# Patient Record
Sex: Female | Born: 1955 | Race: White | Hispanic: No | Marital: Married | Smoking: Never smoker
Health system: Southern US, Community
[De-identification: ages and names within clinical notes are randomized; demographics above are authoritative.]

## PROBLEM LIST (undated history)

## (undated) DIAGNOSIS — E119 Type 2 diabetes mellitus without complications: Secondary | ICD-10-CM

## (undated) DIAGNOSIS — E785 Hyperlipidemia, unspecified: Secondary | ICD-10-CM

## (undated) DIAGNOSIS — I10 Essential (primary) hypertension: Secondary | ICD-10-CM

## (undated) DIAGNOSIS — C50919 Malignant neoplasm of unspecified site of unspecified female breast: Secondary | ICD-10-CM

## (undated) DIAGNOSIS — D693 Immune thrombocytopenic purpura: Secondary | ICD-10-CM

## (undated) HISTORY — PX: TONSILLECTOMY: SUR1361

## (undated) HISTORY — DX: Hyperlipidemia, unspecified: E78.5

## (undated) HISTORY — DX: Immune thrombocytopenic purpura: D69.3

## (undated) HISTORY — DX: Malignant neoplasm of unspecified site of unspecified female breast: C50.919

## (undated) HISTORY — DX: Essential (primary) hypertension: I10

---

## 1985-02-14 DIAGNOSIS — D693 Immune thrombocytopenic purpura: Secondary | ICD-10-CM

## 1985-02-14 HISTORY — DX: Immune thrombocytopenic purpura: D69.3

## 1996-03-21 HISTORY — PX: LUMBAR DISC SURGERY: SHX700

## 1997-07-04 ENCOUNTER — Other Ambulatory Visit: Admission: RE | Admit: 1997-07-04 | Discharge: 1997-07-04 | Payer: Self-pay | Admitting: Obstetrics and Gynecology

## 1997-09-16 ENCOUNTER — Ambulatory Visit (HOSPITAL_COMMUNITY): Admission: RE | Admit: 1997-09-16 | Discharge: 1997-09-16 | Payer: Self-pay | Admitting: Obstetrics and Gynecology

## 1997-10-07 ENCOUNTER — Other Ambulatory Visit: Admission: RE | Admit: 1997-10-07 | Discharge: 1997-10-07 | Payer: Self-pay | Admitting: Obstetrics and Gynecology

## 1998-08-20 ENCOUNTER — Other Ambulatory Visit: Admission: RE | Admit: 1998-08-20 | Discharge: 1998-08-20 | Payer: Self-pay | Admitting: Obstetrics and Gynecology

## 1998-08-20 ENCOUNTER — Encounter (INDEPENDENT_AMBULATORY_CARE_PROVIDER_SITE_OTHER): Payer: Self-pay | Admitting: Specialist

## 1998-11-18 ENCOUNTER — Encounter (INDEPENDENT_AMBULATORY_CARE_PROVIDER_SITE_OTHER): Payer: Self-pay

## 1998-11-18 ENCOUNTER — Inpatient Hospital Stay (HOSPITAL_COMMUNITY): Admission: RE | Admit: 1998-11-18 | Discharge: 1998-11-19 | Payer: Self-pay | Admitting: Obstetrics and Gynecology

## 1998-11-18 HISTORY — PX: ABDOMINAL HYSTERECTOMY: SHX81

## 1999-02-17 ENCOUNTER — Other Ambulatory Visit: Admission: RE | Admit: 1999-02-17 | Discharge: 1999-02-17 | Payer: Self-pay | Admitting: Obstetrics and Gynecology

## 1999-03-04 ENCOUNTER — Ambulatory Visit (HOSPITAL_COMMUNITY): Admission: RE | Admit: 1999-03-04 | Discharge: 1999-03-04 | Payer: Self-pay | Admitting: Obstetrics and Gynecology

## 1999-03-04 ENCOUNTER — Encounter: Payer: Self-pay | Admitting: Obstetrics and Gynecology

## 2000-03-21 ENCOUNTER — Encounter: Payer: Self-pay | Admitting: Obstetrics and Gynecology

## 2000-03-21 ENCOUNTER — Ambulatory Visit (HOSPITAL_COMMUNITY): Admission: RE | Admit: 2000-03-21 | Discharge: 2000-03-21 | Payer: Self-pay | Admitting: Obstetrics and Gynecology

## 2001-04-17 ENCOUNTER — Ambulatory Visit (HOSPITAL_COMMUNITY): Admission: RE | Admit: 2001-04-17 | Discharge: 2001-04-17 | Payer: Self-pay | Admitting: Obstetrics and Gynecology

## 2001-04-17 ENCOUNTER — Encounter: Payer: Self-pay | Admitting: Obstetrics and Gynecology

## 2001-06-13 ENCOUNTER — Other Ambulatory Visit: Admission: RE | Admit: 2001-06-13 | Discharge: 2001-06-13 | Payer: Self-pay | Admitting: Obstetrics and Gynecology

## 2002-06-06 ENCOUNTER — Encounter: Payer: Self-pay | Admitting: Obstetrics and Gynecology

## 2002-06-06 ENCOUNTER — Ambulatory Visit (HOSPITAL_COMMUNITY): Admission: RE | Admit: 2002-06-06 | Discharge: 2002-06-06 | Payer: Self-pay | Admitting: Obstetrics and Gynecology

## 2003-02-17 ENCOUNTER — Ambulatory Visit (HOSPITAL_BASED_OUTPATIENT_CLINIC_OR_DEPARTMENT_OTHER): Admission: RE | Admit: 2003-02-17 | Discharge: 2003-02-17 | Payer: Self-pay | Admitting: Family Medicine

## 2003-07-29 ENCOUNTER — Ambulatory Visit (HOSPITAL_COMMUNITY): Admission: RE | Admit: 2003-07-29 | Discharge: 2003-07-29 | Payer: Self-pay | Admitting: Family Medicine

## 2004-02-13 ENCOUNTER — Ambulatory Visit (HOSPITAL_COMMUNITY): Admission: RE | Admit: 2004-02-13 | Discharge: 2004-02-13 | Payer: Self-pay | Admitting: Gastroenterology

## 2004-08-09 ENCOUNTER — Ambulatory Visit (HOSPITAL_COMMUNITY): Admission: RE | Admit: 2004-08-09 | Discharge: 2004-08-09 | Payer: Self-pay | Admitting: Family Medicine

## 2004-08-16 ENCOUNTER — Encounter: Admission: RE | Admit: 2004-08-16 | Discharge: 2004-08-16 | Payer: Self-pay | Admitting: Family Medicine

## 2005-02-21 ENCOUNTER — Encounter: Admission: RE | Admit: 2005-02-21 | Discharge: 2005-02-21 | Payer: Self-pay | Admitting: Family Medicine

## 2005-08-12 ENCOUNTER — Encounter: Admission: RE | Admit: 2005-08-12 | Discharge: 2005-08-12 | Payer: Self-pay | Admitting: Family Medicine

## 2005-08-23 ENCOUNTER — Encounter: Admission: RE | Admit: 2005-08-23 | Discharge: 2005-08-23 | Payer: Self-pay | Admitting: Family Medicine

## 2006-02-24 ENCOUNTER — Encounter: Admission: RE | Admit: 2006-02-24 | Discharge: 2006-02-24 | Payer: Self-pay | Admitting: Family Medicine

## 2006-08-23 ENCOUNTER — Encounter: Admission: RE | Admit: 2006-08-23 | Discharge: 2006-08-23 | Payer: Self-pay | Admitting: Family Medicine

## 2007-09-11 ENCOUNTER — Encounter: Admission: RE | Admit: 2007-09-11 | Discharge: 2007-09-11 | Payer: Self-pay | Admitting: Family Medicine

## 2008-02-04 ENCOUNTER — Encounter (INDEPENDENT_AMBULATORY_CARE_PROVIDER_SITE_OTHER): Payer: Self-pay | Admitting: General Surgery

## 2008-02-04 ENCOUNTER — Ambulatory Visit (HOSPITAL_BASED_OUTPATIENT_CLINIC_OR_DEPARTMENT_OTHER): Admission: RE | Admit: 2008-02-04 | Discharge: 2008-02-04 | Payer: Self-pay | Admitting: General Surgery

## 2008-02-11 ENCOUNTER — Ambulatory Visit (HOSPITAL_COMMUNITY): Admission: RE | Admit: 2008-02-11 | Discharge: 2008-02-11 | Payer: Self-pay | Admitting: Surgery

## 2008-02-12 ENCOUNTER — Ambulatory Visit (HOSPITAL_COMMUNITY): Admission: RE | Admit: 2008-02-12 | Discharge: 2008-02-12 | Payer: Self-pay | Admitting: Surgery

## 2008-03-10 ENCOUNTER — Encounter: Admission: RE | Admit: 2008-03-10 | Discharge: 2008-06-08 | Payer: Self-pay | Admitting: Surgery

## 2008-05-19 ENCOUNTER — Ambulatory Visit (HOSPITAL_COMMUNITY): Admission: RE | Admit: 2008-05-19 | Discharge: 2008-05-20 | Payer: Self-pay | Admitting: Surgery

## 2008-05-19 ENCOUNTER — Encounter (INDEPENDENT_AMBULATORY_CARE_PROVIDER_SITE_OTHER): Payer: Self-pay | Admitting: Surgery

## 2008-05-19 HISTORY — PX: LAPAROSCOPIC GASTRIC BANDING: SHX1100

## 2008-07-15 ENCOUNTER — Encounter: Admission: RE | Admit: 2008-07-15 | Discharge: 2008-10-13 | Payer: Self-pay | Admitting: Surgery

## 2008-09-01 ENCOUNTER — Encounter: Admission: RE | Admit: 2008-09-01 | Discharge: 2008-09-01 | Payer: Self-pay | Admitting: Family Medicine

## 2008-09-11 ENCOUNTER — Encounter: Admission: RE | Admit: 2008-09-11 | Discharge: 2008-09-11 | Payer: Self-pay | Admitting: Family Medicine

## 2008-10-03 ENCOUNTER — Encounter: Admission: RE | Admit: 2008-10-03 | Discharge: 2008-10-03 | Payer: Self-pay | Admitting: Urology

## 2008-10-28 ENCOUNTER — Encounter: Admission: RE | Admit: 2008-10-28 | Discharge: 2008-10-28 | Payer: Self-pay | Admitting: Surgery

## 2009-02-27 ENCOUNTER — Encounter: Admission: RE | Admit: 2009-02-27 | Discharge: 2009-05-28 | Payer: Self-pay | Admitting: Surgery

## 2009-10-20 ENCOUNTER — Encounter: Admission: RE | Admit: 2009-10-20 | Discharge: 2009-10-20 | Payer: Self-pay | Admitting: Family Medicine

## 2010-03-07 ENCOUNTER — Encounter: Payer: Self-pay | Admitting: Family Medicine

## 2010-05-26 LAB — DIFFERENTIAL
Eosinophils Absolute: 0 10*3/uL (ref 0.0–0.7)
Lymphs Abs: 1.1 10*3/uL (ref 0.7–4.0)
Monocytes Relative: 8 % (ref 3–12)
Neutro Abs: 4 10*3/uL (ref 1.7–7.7)
Neutrophils Relative %: 72 % (ref 43–77)

## 2010-05-26 LAB — CBC
Hemoglobin: 12.6 g/dL (ref 12.0–15.0)
MCV: 91.5 fL (ref 78.0–100.0)
RBC: 4.03 MIL/uL (ref 3.87–5.11)
WBC: 5.5 10*3/uL (ref 4.0–10.5)

## 2010-05-26 LAB — HEMOGLOBIN AND HEMATOCRIT, BLOOD: HCT: 38.2 % (ref 36.0–46.0)

## 2010-05-27 LAB — COMPREHENSIVE METABOLIC PANEL
CO2: 28 mEq/L (ref 19–32)
Calcium: 9.6 mg/dL (ref 8.4–10.5)
Creatinine, Ser: 0.84 mg/dL (ref 0.4–1.2)
GFR calc non Af Amer: >60 mL/min (ref >60)
Glucose, Bld: 133 mg/dL — ABNORMAL HIGH (ref 70–99)

## 2010-05-27 LAB — DIFFERENTIAL
Lymphocytes Relative: 23 % (ref 12–46)
Lymphs Abs: 1.6 10*3/uL (ref 0.7–4.0)
Neutrophils Relative %: 66 % (ref 43–77)

## 2010-05-27 LAB — CBC
Hemoglobin: 13.8 g/dL (ref 12.0–15.0)
MCHC: 33.7 g/dL (ref 30.0–36.0)
MCV: 91.4 fL (ref 78.0–100.0)
RBC: 4.49 MIL/uL (ref 3.87–5.11)

## 2010-06-29 NOTE — Op Note (Signed)
Lauren [name], Lauren [name]              ACCOUNT NO.:  [id number]   MEDICAL RECORD NO.:  [id number]          PATIENT TYPE:  AMB   LOCATION:  DAY                          FACILITY:  [facility]   PHYSICIAN:  Thornton Park. Daphine Deutscher, MD  DATE OF BIRTH:  1955/11/21   DATE OF PROCEDURE:  05/19/2008  DATE OF DISCHARGE:                               OPERATIVE REPORT   PREOPERATIVE DIAGNOSES:  Morbid obesity, body mass index 48,  questionable small hiatal hernia asymptomatic, gallstones.   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   PROCEDURE:  Laparoscopic excision of gastrointestinal stromal tumor from  stomach, laparoscopic APS Allergan Lap-Band.   SURGEON:  Luretha Murphy, M.D.   ASSISTANT:  Ovidio Kin, M.D.   ANESTHESIA:  General endotracheal.   DESCRIPTION OF PROCEDURE:  Lauren [name] was taken to room one on  Monday, May 19, 2008 and given general anesthesia.  The abdomen was  prepped with a Techni-Care equivalent and draped sterilely.  The access  of the abdomen was gained through the left upper quadrant with an  Optiview 0 degree scope entering without difficulty.  The abdomen was  insufflated.  She had a very prominent protuberance anteriorly as if she  has kind of stretched her anterior abdominal wall.  Standard trocar  placement was used including a 15 in the right upper quadrant placed  obliquely, an 11 below that on the right where we subsequently put her  subcutaneous port, 5 in the middle where the [facility] retractor was  used retract the liver.  First I surveyed her gallbladder and was nice  Lodema's egg blue with no evidence of any adhesions or evidence of a  cholecystitis.  We went up anteriorly and exposed the stomach, found a  little white, firm nodule along the fundus probably a GIST.  I went  ahead and put a suture through that  and held it with tie knot and held  it up while I got under it with an Endo-GIA and stapled under it to get  a good margin.  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  Meanwhile, I went posteriorly and after we  passed the balloon initially and filled it up with 15 mL air and pulled  it back at it stopped at the hiatus.  There was no dimpling.  I went  ahead and exposed the hiatus posteriorly, looked at it, blew up the  balloon again with 10 mL and again it held up at the hiatus, so I think  that it is unlikely she had a significant hiatal hernia.   The APS band was then inserted after passing from the fat stripe along  the right crus using the pars flaccida technique, coming around the top  part of stomach and then bringing it around, engaging it and then  passing and snapping it over the calibration tubing.  Once it was  engaged it was held down while I plicated with three sutures using tie  knots.  This went well.   The tubing was brought out through the right lower port, connected to  the port which had some mesh sewn on the back and it was implanted in  the subcutaneous position.  The wounds were irrigated and infiltrated  with Marcaine and were closed with 4-0 Vicryl, Benzoin and Steri-Strips.  The patient tolerated the procedure well and was taken to the recovery  room in satisfactory condition.      Thornton Park Daphine Deutscher, MD  Electronically Signed     MBM/MEDQ  D:  05/19/2008  T:  05/19/2008  Job:  [id number]   cc:   Otilio Connors. Gerri Spore, M.D.  Fax: [phone]

## 2010-06-29 NOTE — Op Note (Signed)
[name], [name]              ACCOUNT NO.:  [id number]   MEDICAL RECORD NO.:  [id number]          PATIENT TYPE:  AMB   LOCATION:  DSC                          FACILITY:  [facility]   PHYSICIAN:  Gabrielle Dare. Janee Morn, M.D.DATE OF BIRTH:  1955-11-25   DATE OF PROCEDURE:  DATE OF DISCHARGE:                               OPERATIVE REPORT   PREOPERATIVE DIAGNOSIS:  Sebaceous cyst of the scalp x2.   POSTOPERATIVE DIAGNOSIS:  Sebaceous cyst of the scalp x2.   PROCEDURE:  Excision of sebaceous cysts of the scalp x2.   SURGEON:  Gabrielle Dare. Janee Morn, MD   ANESTHESIA:  General, laryngeal mask airway   HISTORY OF PRESENT ILLNESS:  [name] is a 55 year old female who  has a many-year history of enlarging cysts on her scalp, one on the  right side and one on the left.  They are in the mid parietal region.  She presents today for elective excision of both.  She had no evidence  of infectious complications.   PROCEDURE IN DETAIL:  The patient was identified in the preop holding  area and informed consent was obtained.  She received intravenous  antibiotics.  Her 2 cyst sites were marked, and again there is no  evidence of acute infection at this time.  She was brought to the  operating room.  General anesthesia with laryngeal mask airway was  administered by the anesthesia staff, and her scalp was prepped and  draped in sterile fashion after limited hair coming over both scalps.  Attention was first directed to the cyst on the right scalp.  Area was  infiltrated with 0.25% Marcaine with epinephrine.  A transverse incision  was made revealing the cyst wall.  This was circumferentially dissected  and removed on one piece and sent to Pathology.  Wound was copiously  irrigated.  Hemostasis was obtained using Bovie cautery.  Wound was then  closed with interrupted 3-0 nylon sutures which also achieved excellent  hemostasis.  The attention was directed to the left cyst.  This area was  again  injected with 0.25% Marcaine with epinephrine.  Transverse  incision was made.  Subcutaneous tissue was dissected down revealing the  cyst wall.  This was circumferentially dissected with sharp dissection,  and the cyst wall was removed in its entirety without difficulty.  Wound  was copiously irrigated.  Hemostasis was obtained with Bovie cautery.  The wound was then closed with interrupted 3-0 nylon sutures and  excellent hemostasis was achieved.  Both cysts were sent separately to  Pathology.  Sponge, needle, and instrument counts were all correct for  both.  Some triple antibiotic ointment was placed.  The patient  tolerated the procedure well without apparent complication and was taken  to recovery room in stable condition.     Gabrielle Dare Janee Morn, M.D.  Electronically Signed    BET/MEDQ  D:  02/04/2008  T:  02/05/2008  Job:  [id number]   cc:   Otilio Connors. Gerri Spore, M.D.

## 2010-07-02 NOTE — Op Note (Signed)
NAMEJANESA, Lauren [name]              ACCOUNT NO.:  [id number]   MEDICAL RECORD NO.:  [id number]          PATIENT TYPE:  AMB   LOCATION:  ENDO                         FACILITY:  [facility]   PHYSICIAN:  John C. Madilyn Fireman, M.D.    DATE OF BIRTH:  [birth year]   DATE OF PROCEDURE:  02/13/2004  DATE OF DISCHARGE:                                 OPERATIVE REPORT   PROCEDURE:  Colonoscopy.   INDICATION FOR PROCEDURE:  Average risk colon cancer screening.   DESCRIPTION OF PROCEDURE:  The patient was placed in the left lateral  decubitus position and placed on the pulse monitor with continuous low-flow  oxygen delivered by nasal cannula.  She was sedated with 100 mcg IV fentanyl  and 10 mg IV Versed.  The Olympus video colonoscope was inserted into the  rectum and advanced to the cecum, confirmed by transillumination at  McBurney's point and visualization of the ileocecal valve and appendiceal  orifice.  The prep was excellent.  The cecum, ascending, transverse,  descending, and sigmoid colon all appeared normal with no masses, polyps,  diverticula, or other mucosal abnormalities.  The rectum likewise appeared  normal, and retroflexed view of the anus revealed no obvious internal  hemorrhoids.  The scope was then withdrawn and the patient returned to the  recovery room in stable condition.  She tolerated the procedure well, and  there were no immediate complications.   IMPRESSION:  Normal colonoscopy.   PLAN:  Next colon screening by sigmoidoscopy in 5 years.      JCH/MEDQ  D:  02/13/2004  T:  02/13/2004  Job:  [id number]   cc:   Duncan Dull, M.D.  [address]  [place]  [place] [address]  Fax: [phone]

## 2010-09-21 ENCOUNTER — Other Ambulatory Visit: Payer: Self-pay | Admitting: Family Medicine

## 2010-09-21 DIAGNOSIS — Z1231 Encounter for screening mammogram for malignant neoplasm of breast: Secondary | ICD-10-CM

## 2010-09-24 ENCOUNTER — Encounter (INDEPENDENT_AMBULATORY_CARE_PROVIDER_SITE_OTHER): Payer: Self-pay

## 2010-10-21 ENCOUNTER — Other Ambulatory Visit: Payer: Self-pay | Admitting: Family Medicine

## 2010-10-21 DIAGNOSIS — R1011 Right upper quadrant pain: Secondary | ICD-10-CM

## 2010-10-22 ENCOUNTER — Ambulatory Visit
Admission: RE | Admit: 2010-10-22 | Discharge: 2010-10-22 | Disposition: A | Discharge status: Home / Self Care | Payer: BC Managed Care – PPO | Source: Ambulatory Visit | Attending: Family Medicine | Admitting: Family Medicine

## 2010-10-22 DIAGNOSIS — R1011 Right upper quadrant pain: Secondary | ICD-10-CM

## 2010-10-25 ENCOUNTER — Ambulatory Visit
Admission: RE | Admit: 2010-10-25 | Discharge: 2010-10-25 | Disposition: A | Discharge status: Home / Self Care | Payer: BC Managed Care – PPO | Source: Ambulatory Visit | Attending: Family Medicine | Admitting: Family Medicine

## 2010-10-25 DIAGNOSIS — Z1231 Encounter for screening mammogram for malignant neoplasm of breast: Secondary | ICD-10-CM

## 2010-10-27 ENCOUNTER — Ambulatory Visit: Payer: Self-pay

## 2010-12-06 ENCOUNTER — Telehealth (INDEPENDENT_AMBULATORY_CARE_PROVIDER_SITE_OTHER): Payer: Self-pay

## 2010-12-06 NOTE — Telephone Encounter (Signed)
Pt calling wanting to know about taking OTC Robitussiem for a severe cough she has had  for several weeks. I advised her it was ok as long as there was no Aspirin in the product. Pt understands.Lauren [name]

## 2011-03-03 ENCOUNTER — Encounter (INDEPENDENT_AMBULATORY_CARE_PROVIDER_SITE_OTHER): Payer: Self-pay | Admitting: General Surgery

## 2011-03-03 ENCOUNTER — Ambulatory Visit (INDEPENDENT_AMBULATORY_CARE_PROVIDER_SITE_OTHER): Payer: BC Managed Care – PPO | Admitting: Surgery

## 2011-03-03 ENCOUNTER — Encounter (INDEPENDENT_AMBULATORY_CARE_PROVIDER_SITE_OTHER): Payer: Self-pay | Admitting: Surgery

## 2011-03-03 VITALS — BP 112/80 | HR 76 | Resp 16 | Ht 66.5 in | Wt 228.5 lb

## 2011-03-03 DIAGNOSIS — Z9884 Bariatric surgery status: Secondary | ICD-10-CM

## 2011-03-03 NOTE — Progress Notes (Signed)
Lauren [name] comes in today after her lab Band-Aid PDS. She was last seen by me in the in March of 12. Her band was put in April of 10. She has an APS band Her main complaint now is when she eats anything she is having increasing problems with pain in the xiphoid region sometimes a burning and sometimes with a lump in her. It sounds like she is having some element of esophageal spasm. I think since she's had this for a while however like to get an upper GI series to see if she has a low anterior slip or if she develops some dilatation of the esophagus. We'll get an upper GI series and I'll see her back after that.

## 2011-03-10 ENCOUNTER — Ambulatory Visit
Admission: RE | Admit: 2011-03-10 | Discharge: 2011-03-10 | Disposition: A | Discharge status: Home / Self Care | Payer: BC Managed Care – PPO | Source: Ambulatory Visit | Attending: Surgery | Admitting: Surgery

## 2011-03-10 DIAGNOSIS — Z9884 Bariatric surgery status: Secondary | ICD-10-CM

## 2011-03-16 ENCOUNTER — Telehealth (INDEPENDENT_AMBULATORY_CARE_PROVIDER_SITE_OTHER): Payer: Self-pay | Admitting: General Surgery

## 2011-03-16 NOTE — Telephone Encounter (Signed)
Spoke with the patient to discuss her UGI results. She is requesting fluid being removed but unable to come to the office this week she will call to RS.

## 2011-03-18 ENCOUNTER — Ambulatory Visit (INDEPENDENT_AMBULATORY_CARE_PROVIDER_SITE_OTHER): Payer: BC Managed Care – PPO | Admitting: Surgery

## 2011-03-18 DIAGNOSIS — D214 Benign neoplasm of connective and other soft tissue of abdomen: Secondary | ICD-10-CM | POA: Insufficient documentation

## 2011-03-18 DIAGNOSIS — Z9884 Bariatric surgery status: Secondary | ICD-10-CM

## 2011-03-18 DIAGNOSIS — R112 Nausea with vomiting, unspecified: Secondary | ICD-10-CM

## 2011-03-18 NOTE — Progress Notes (Signed)
UGI noted.  Will get thyroid ultrasound.  Took out 0.3 cc from band and will see if that relieves her nausea and vomiting.  If not then we need to perform lap chole for gallstones that may be causing her nausea.

## 2011-03-18 NOTE — Progress Notes (Signed)
Addended by: Latricia Heft on: 03/18/2011 03:08 PM   Modules accepted: Orders

## 2011-03-22 ENCOUNTER — Other Ambulatory Visit: Payer: BC Managed Care – PPO

## 2011-03-25 ENCOUNTER — Ambulatory Visit
Admission: RE | Admit: 2011-03-25 | Discharge: 2011-03-25 | Disposition: A | Discharge status: Home / Self Care | Payer: BC Managed Care – PPO | Source: Ambulatory Visit | Attending: Surgery | Admitting: Surgery

## 2011-03-25 DIAGNOSIS — Z9884 Bariatric surgery status: Secondary | ICD-10-CM

## 2011-04-29 ENCOUNTER — Encounter (INDEPENDENT_AMBULATORY_CARE_PROVIDER_SITE_OTHER): Payer: Self-pay | Admitting: Surgery

## 2011-04-29 ENCOUNTER — Ambulatory Visit (INDEPENDENT_AMBULATORY_CARE_PROVIDER_SITE_OTHER): Payer: BC Managed Care – PPO | Admitting: Surgery

## 2011-04-29 VITALS — BP 134/88 | HR 68 | Temp 97.8°F | Resp 18 | Ht 66.5 in | Wt 236.6 lb

## 2011-04-29 DIAGNOSIS — E049 Nontoxic goiter, unspecified: Secondary | ICD-10-CM

## 2011-04-29 DIAGNOSIS — K811 Chronic cholecystitis: Secondary | ICD-10-CM

## 2011-04-29 NOTE — Progress Notes (Signed)
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 female Is seen in followup after her lapband ATS with removal of a gist tumor in April of 2010. She is down about 54 pounds and his [place] though with some nausea and vomiting. She has known gallstones. We discussed removal of her gallbladder as this may be chronic cholecystitis. She also had the gist tumor that were removed the time of her lapband and that will weighs undermine and she was noted we can look at that as well. I offered her fell but she is back taken at that may be contributing to her nausea and vomiting stitch to tie.  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 She thinks it would best be done in June. We can quit in schedule her elective laparoscopic cholecystectomy with the examination of her stomach GIST resection site.  She also has a multinodular goiter. This is not that obvious by palpation you can appreciated. Ultrasound was performed which did not show any definite lesions there were amenable to biopsy. We discussed management of this and we'll watch this. Currently she is asymptomatic.  We'll plan laparoscopic cholecystectomy in June  Past Medical History  Diagnosis Date  . Hyperlipidemia   . Hypertension   . Idiopathic thrombocytopenic purpura (ITP)     Past Surgical History  Procedure Date  . Laparoscopic gastric banding 05/19/2008  . Partial hysterectomy 11/18/1998    ovaries remain  . Lumbar disc surgery 03/21/1996    Current Outpatient Prescriptions  Medication Sig Dispense Refill  . AMBULATORY NON FORMULARY MEDICATION Stool softener      . aspirin 81 MG tablet Take 160 mg by mouth daily.      Marland Kitchen atenolol (TENORMIN) 50 MG tablet Take 50 mg by mouth 2 (two) times daily.      . benazepril-hydrochlorthiazide (LOTENSIN HCT) 20-12.5 MG per tablet daily.      . Calcium  Carbonate-Vit D-Min (GNP CALCIUM PLUS 600 +D PO) Take by mouth.      . multivitamin-iron-minerals-folic acid (CENTRUM) chewable tablet Chew 1 tablet by mouth daily.      . simvastatin (ZOCOR) 20 MG tablet Take 20 mg by mouth every evening.       Review of patient's allergies indicates no known allergies. Family History  Problem Relation Age of Onset  . Breast cancer Maternal Aunt   . Cancer Maternal Aunt     breast  . Colon polyps Mother   . Heart disease Mother   . Diabetes Father   . Heart disease Father    Social History:   reports that she has never smoked. She does not have any smokeless tobacco history on file. She reports that she does not drink alcohol or use illicit drugs.   REVIEW OF SYSTEMS - PERTINENT POSITIVES ONLY: noncontributory  Physical Exam:   Blood pressure 134/88, pulse 68, temperature 97.8 F (36.6 C), temperature source Temporal, resp. rate 18, height 5' 6.5" (1.689 m), weight 236 lb 9.6 oz (107.321 kg). Body mass index is 37.62 kg/(m^2).  Gen:  WDWN white female NAD  Neurological: Alert and oriented to person, place, and time. Motor and sensory function is grossly intact  Head: Normocephalic and atraumatic.  Eyes: Conjunctivae are normal. Pupils are equal, round, and reactive to light. No scleral icterus.  Neck: Normal range of motion. Neck supple.  No tracheal deviation or thyromegaly present.  Cardiovascular:  SR without murmurs or gallops.  No carotid bruits Respiratory: Effort normal.  No respiratory distress. No chest wall tenderness. Breath sounds normal.  No wheezes, rales or rhonchi.  Abdomen:  Not examined GU: Musculoskeletal: Normal range of motion. Extremities are nontender. No cyanosis, edema or clubbing noted Lymphadenopathy: No cervical, preauricular, postauricular or axillary adenopathy is present Skin: Skin is warm and dry. No rash noted. No diaphoresis. No erythema. No pallor. Pscyh: Normal mood and affect. Behavior is normal. Judgment and  thought content normal.   LABORATORY RESULTS: No results found for this or any previous visit (from the past 48 hour(s)).  RADIOLOGY RESULTS: No results found.  Problem List: Patient Active Problem List  Diagnoses  . Lapband APS April 2010  . 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  Lapband APS and multinodular goiter Lapchole in June    Matt B. Daphine Deutscher, MD, [facility] [phone] beeper [phone]  04/29/2011 5:30 PM

## 2011-05-16 ENCOUNTER — Telehealth (INDEPENDENT_AMBULATORY_CARE_PROVIDER_SITE_OTHER): Payer: Self-pay | Admitting: General Surgery

## 2011-05-16 NOTE — Progress Notes (Signed)
Quick Note:  Please make patient an appointment to see me. Thanks ______ 

## 2011-05-16 NOTE — Telephone Encounter (Addendum)
Message copied by Latricia Heft on Mon May 16, 2011  3:10 PM ------      Message from: Luretha Murphy B      Created: Mon May 16, 2011 12:04 PM       Please make  patient an appointment to see me. Thanks  Contacted the patient and scheduled an appt for 05/21/11 per Dr Norva Riffle instruction

## 2011-05-18 ENCOUNTER — Encounter (INDEPENDENT_AMBULATORY_CARE_PROVIDER_SITE_OTHER): Payer: BC Managed Care – PPO | Admitting: Surgery

## 2011-07-15 ENCOUNTER — Encounter (INDEPENDENT_AMBULATORY_CARE_PROVIDER_SITE_OTHER): Payer: Self-pay | Admitting: Surgery

## 2011-07-15 ENCOUNTER — Ambulatory Visit (INDEPENDENT_AMBULATORY_CARE_PROVIDER_SITE_OTHER): Payer: BC Managed Care – PPO | Admitting: Surgery

## 2011-07-15 VITALS — BP 130/76 | HR 70 | Temp 97.4°F | Resp 16 | Ht 66.5 in | Wt 243.1 lb

## 2011-07-15 DIAGNOSIS — K811 Chronic cholecystitis: Secondary | ICD-10-CM

## 2011-07-15 NOTE — Progress Notes (Signed)
Chief Complaint:  Bloating abdominal pain and chronic cholecystitis superimposed on lapband  History of Present Illness:  Lauren [name] is an 56 y.o. female who I saw in today to discuss the status of her lapband. She was questioning whether she should have the fill prior to her upcoming surgery taking out her gallbladder. She is getting a little weight since I removed some fluid earlier but still gets bloating abdominal pain which leads me to think that a secondary issue is her gallbladder and that hopefully removal of that will remove the symptoms. I told her I thought it would be better to go in and do her cholecystectomy and then consider more aggressive fills of her band.  She is developing maladaptive eating as she is eating ice cream and I think between that and the [employer] and cottage cheese are thwarting her weight loss.    Past Medical History  Diagnosis Date  . Hyperlipidemia   . Hypertension   . Idiopathic thrombocytopenic purpura (ITP)     Past Surgical History  Procedure Date  . Laparoscopic gastric banding 05/19/2008  . Partial hysterectomy 11/18/1998    ovaries remain  . Lumbar disc surgery 03/21/1996    Current Outpatient Prescriptions  Medication Sig Dispense Refill  . AMBULATORY NON FORMULARY MEDICATION Stool softener      . aspirin 81 MG tablet Take 160 mg by mouth daily.      Marland Kitchen atenolol (TENORMIN) 50 MG tablet Take 50 mg by mouth 2 (two) times daily.      . benazepril-hydrochlorthiazide (LOTENSIN HCT) 20-12.5 MG per tablet daily.      . Calcium Carbonate-Vit D-Min (GNP CALCIUM PLUS 600 +D PO) Take by mouth.      . multivitamin-iron-minerals-folic acid (CENTRUM) chewable tablet Chew 1 tablet by mouth daily.      . simvastatin (ZOCOR) 20 MG tablet Take 20 mg by mouth every evening.       Review of patient's allergies indicates no known allergies. Family History  Problem Relation Age of Onset  . Breast cancer Maternal Aunt   . Cancer Maternal Aunt     breast    . Colon polyps Mother   . Heart disease Mother   . Diabetes Father   . Heart disease Father    Social History:   reports that she has never smoked. She does not have any smokeless tobacco history on file. She reports that she does not drink alcohol or use illicit drugs.   REVIEW OF SYSTEMS - PERTINENT POSITIVES ONLY: noncontributory  Physical Exam:   Blood pressure 130/76, pulse 70, temperature 97.4 F (36.3 C), temperature source Temporal, resp. rate 16, height 5' 6.5" (1.689 m), weight 243 lb 2 oz (110.281 kg). Body mass index is 38.65 kg/(m^2).  Gen:  WDWN white female NAD  Neurological: Alert and oriented to person, place, and time. Motor and sensory function is grossly intact  Head: Normocephalic and atraumatic.  Eyes: Conjunctivae are normal. Pupils are equal, round, and reactive to light. No scleral icterus.  Neck: Normal range of motion. Neck supple. No tracheal deviation or thyromegaly present.  Cardiovascular:  SR without murmurs or gallops.  No carotid bruits Respiratory: Effort normal.  No respiratory distress. No chest wall tenderness. Breath sounds normal.  No wheezes, rales or rhonchi.  Abdomen:  nontender at present GU: Musculoskeletal: Normal range of motion. Extremities are nontender. No cyanosis, edema or clubbing noted Lymphadenopathy: No cervical, preauricular, postauricular or axillary adenopathy is present Skin: Skin is warm  and dry. No rash noted. No diaphoresis. No erythema. No pallor. Pscyh: Normal mood and affect. Behavior is normal. Judgment and thought content normal.   LABORATORY RESULTS: No results found for this or any previous visit (from the past 48 hour(s)).  RADIOLOGY RESULTS: No results found.  Problem List: Patient Active Problem List  Diagnoses  . Lapband APS April 2010  . 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 Multinodular goiter  . Chronic cholecystitis    Assessment &  Plan: Abdominal pain, nausea and chronic cholecystitis is lapband patient.  Plan lap chole June 18th.      Matt B. Daphine Deutscher, MD, [facility] [phone] beeper [phone]  07/15/2011 9:26 AM

## 2011-07-26 ENCOUNTER — Encounter (HOSPITAL_COMMUNITY): Payer: Self-pay | Admitting: Pharmacy Technician

## 2011-07-28 ENCOUNTER — Other Ambulatory Visit (INDEPENDENT_AMBULATORY_CARE_PROVIDER_SITE_OTHER): Payer: Self-pay | Admitting: Surgery

## 2011-07-29 ENCOUNTER — Encounter (HOSPITAL_COMMUNITY)
Admission: RE | Admit: 2011-07-29 | Discharge: 2011-07-29 | Disposition: A | Discharge status: Home / Self Care | Payer: BC Managed Care – PPO | Source: Ambulatory Visit | Attending: Surgery | Admitting: Surgery

## 2011-07-29 ENCOUNTER — Ambulatory Visit (HOSPITAL_COMMUNITY)
Admission: RE | Admit: 2011-07-29 | Discharge: 2011-07-29 | Disposition: A | Discharge status: Home / Self Care | Payer: BC Managed Care – PPO | Source: Ambulatory Visit | Attending: Surgery | Admitting: Surgery

## 2011-07-29 ENCOUNTER — Encounter (HOSPITAL_COMMUNITY): Payer: Self-pay

## 2011-07-29 DIAGNOSIS — Z01812 Encounter for preprocedural laboratory examination: Secondary | ICD-10-CM | POA: Insufficient documentation

## 2011-07-29 DIAGNOSIS — Z01810 Encounter for preprocedural cardiovascular examination: Secondary | ICD-10-CM | POA: Insufficient documentation

## 2011-07-29 DIAGNOSIS — K811 Chronic cholecystitis: Secondary | ICD-10-CM | POA: Insufficient documentation

## 2011-07-29 DIAGNOSIS — Z01818 Encounter for other preprocedural examination: Secondary | ICD-10-CM | POA: Insufficient documentation

## 2011-07-29 DIAGNOSIS — I498 Other specified cardiac arrhythmias: Secondary | ICD-10-CM | POA: Insufficient documentation

## 2011-07-29 LAB — CBC
Hemoglobin: 13.5 g/dL (ref 12.0–15.0)
MCHC: 33.9 g/dL (ref 30.0–36.0)
RDW: 12.6 % (ref 11.5–15.5)
WBC: 5.2 10*3/uL (ref 4.0–10.5)

## 2011-07-29 LAB — BASIC METABOLIC PANEL
Chloride: 103 mEq/L (ref 96–112)
GFR calc Af Amer: >90 mL/min (ref >90)
GFR calc non Af Amer: >90 mL/min (ref >90)
Glucose, Bld: 99 mg/dL (ref 70–99)
Potassium: 3.7 mEq/L (ref 3.5–5.1)
Sodium: 140 mEq/L (ref 135–145)

## 2011-07-29 NOTE — Patient Instructions (Addendum)
20 Lauren [name]  07/29/2011   Your procedure is scheduled on:  08-02-2011  Report to [facility] at  0530 AM.  Call this number if you have problems the morning of surgery: [phone]   Remember:fleets enema night before surgery, stop aspirin and mutivitamin now   Do not eat food or drink liquids:After Midnight.  . stenolol Take these medicines the morning of surgery with A SIP OF WATER: atenolol   Do not wear jewelry or make up.  Do not wear lotions, powders, or perfumes.Do not wear deodorant.    Do not bring valuables to the hospital.  Contacts, dentures or bridgework may not be worn into surgery.  Leave suitcase in the car. After surgery it may be brought to your room.  For patients admitted to the hospital, checkout time is 11:00 AM the day of   discharge.     Special Instructions: CHG Shower Use Special Wash: 1/2 bottle night before surgery and 1/2 bottle morning of surgery, use regular soap on face and front and back private area. No shaving after Saturday 07-30-2011   Please read over the following fact sheets that you were given: MRSA Information  Lauren [name] [facility] pre op nurse phone number [phone], call if needed

## 2011-07-29 NOTE — Pre-Procedure Instructions (Signed)
Does anything for examining lapband need to be on consent for surgery? if  So, please enter new consent order in epic

## 2011-07-30 LAB — SURGICAL PCR SCREEN
MRSA, PCR: INVALID — AB
Staphylococcus aureus: INVALID — AB

## 2011-07-31 LAB — MRSA CULTURE

## 2011-08-01 NOTE — Pre-Procedure Instructions (Signed)
Spoke with dr Council Mechanic aware of ekg results 07-29-2011 pt ok for surgery

## 2011-08-01 NOTE — H&P (Addendum)
Chief Complaint: Bloating abdominal pain and chronic cholecystitis superimposed on lapband  History of Present Illness: Lauren [name] is an 56 y.o. female who I saw in today to discuss the status of her lapband. She was questioning whether she should have the fill prior to her upcoming surgery taking out her gallbladder. She is getting a little weight since I removed some fluid earlier but still gets bloating abdominal pain which leads me to think that a secondary issue is her gallbladder and that hopefully removal of that will remove the symptoms. I told her I thought it would be better to go in and do her cholecystectomy and then consider more aggressive fills of her band. She is developing maladaptive eating as she is eating ice cream and I think between that and the [employer] and cottage cheese are thwarting her weight loss.  Past Medical History   Diagnosis  Date   .  Hyperlipidemia    .  Hypertension    .  Idiopathic thrombocytopenic purpura (ITP)     Past Surgical History   Procedure  Date   .  Laparoscopic gastric banding  05/19/2008   .  Partial hysterectomy  11/18/1998     ovaries remain   .  Lumbar disc surgery  03/21/1996    Current Outpatient Prescriptions   Medication  Sig  Dispense  Refill   .  AMBULATORY NON FORMULARY MEDICATION  Stool softener     .  aspirin 81 MG tablet  Take 160 mg by mouth daily.     Marland Kitchen  atenolol (TENORMIN) 50 MG tablet  Take 50 mg by mouth 2 (two) times daily.     .  benazepril-hydrochlorthiazide (LOTENSIN HCT) 20-12.5 MG per tablet  daily.     .  Calcium Carbonate-Vit D-Min (GNP CALCIUM PLUS 600 +D PO)  Take by mouth.     .  multivitamin-iron-minerals-folic acid (CENTRUM) chewable tablet  Chew 1 tablet by mouth daily.     .  simvastatin (ZOCOR) 20 MG tablet  Take 20 mg by mouth every evening.      Review of patient's allergies indicates no known allergies.  Family History   Problem  Relation  Age of Onset   .  Breast cancer  Maternal Aunt    .   Cancer  Maternal Aunt       breast    .  Colon polyps  Mother    .  Heart disease  Mother    .  Diabetes  Father    .  Heart disease  Father     Social History: reports that she has never smoked. She does not have any smokeless tobacco history on file. She reports that she does not drink alcohol or use illicit drugs.  REVIEW OF SYSTEMS - PERTINENT POSITIVES ONLY:  noncontributory  Physical Exam:  Blood pressure 130/76, pulse 70, temperature 97.4 F (36.3 C), temperature source Temporal, resp. rate 16, height 5' 6.5" (1.689 m), weight 243 lb 2 oz (110.281 kg).  Body mass index is 38.65 kg/(m^2).  Gen: WDWN white female NAD  Neurological: Alert and oriented to person, place, and time. Motor and sensory function is grossly intact  Head: Normocephalic and atraumatic.  Eyes: Conjunctivae are normal. Pupils are equal, round, and reactive to light. No scleral icterus.  Neck: Normal range of motion. Neck supple. No tracheal deviation or thyromegaly present.  Cardiovascular: SR without murmurs or gallops. No carotid bruits  Respiratory: Effort normal. No respiratory distress. No  chest wall tenderness. Breath sounds normal. No wheezes, rales or rhonchi.  Abdomen: nontender at present  GU:  Musculoskeletal: Normal range of motion. Extremities are nontender. No cyanosis, edema or clubbing noted Lymphadenopathy: No cervical, preauricular, postauricular or axillary adenopathy is present Skin: Skin is warm and dry. No rash noted. No diaphoresis. No erythema. No pallor. Pscyh: Normal mood and affect. Behavior is normal. Judgment and thought content normal.  LABORATORY RESULTS:  No results found for this or any previous visit (from the past 48 hour(s)).  RADIOLOGY RESULTS:  No results found.  Problem List:  Patient Active Problem List   Diagnoses   .  Lapband APS April 2010   .  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  Multinodular  goiter   .  Chronic cholecystitis    Assessment & Plan:  Abdominal pain, nausea and chronic cholecystitis is lapband patient.  Plan lap chole June 18th.  Matt B. Daphine Deutscher, MD, [facility]  [phone] beeper  [phone]  There has been no change in the patient's past medical history or physical exam in the past 24 hours to the best of my knowledge. I examined the patient in the holding area and have made any changes to the history and physical exam report that is included above.   Expectations and outcome results have been discussed with the patient to include risks and benefits. Will remove GB and look at lapband/stomach area.   All questions have been answered and we will proceed with previously discussed procedure noted and signed in the consent form in the patient's record.    Lauren [name] BMD FACS 7:07 AM  08/02/2011

## 2011-08-02 ENCOUNTER — Encounter (HOSPITAL_COMMUNITY): Payer: Self-pay | Admitting: Anesthesiology

## 2011-08-02 ENCOUNTER — Encounter (HOSPITAL_COMMUNITY): Payer: Self-pay | Admitting: *Deleted

## 2011-08-02 ENCOUNTER — Ambulatory Visit (HOSPITAL_COMMUNITY)
Admission: AD | Admit: 2011-08-02 | Discharge: 2011-08-03 | DRG: 493 | Disposition: A | Discharge status: Home / Self Care | Payer: BC Managed Care – PPO | Source: Ambulatory Visit | Attending: Surgery | Admitting: Surgery

## 2011-08-02 ENCOUNTER — Encounter (HOSPITAL_COMMUNITY)
Admission: AD | Disposition: A | Discharge status: Home / Self Care | Payer: Self-pay | Source: Ambulatory Visit | Attending: Surgery

## 2011-08-02 ENCOUNTER — Ambulatory Visit (HOSPITAL_COMMUNITY): Payer: BC Managed Care – PPO

## 2011-08-02 ENCOUNTER — Ambulatory Visit (HOSPITAL_COMMUNITY): Payer: BC Managed Care – PPO | Admitting: Anesthesiology

## 2011-08-02 DIAGNOSIS — Z79899 Other long term (current) drug therapy: Secondary | ICD-10-CM | POA: Insufficient documentation

## 2011-08-02 DIAGNOSIS — E785 Hyperlipidemia, unspecified: Secondary | ICD-10-CM | POA: Insufficient documentation

## 2011-08-02 DIAGNOSIS — K8010 Calculus of gallbladder with chronic cholecystitis without obstruction: Secondary | ICD-10-CM | POA: Insufficient documentation

## 2011-08-02 DIAGNOSIS — Z7982 Long term (current) use of aspirin: Secondary | ICD-10-CM | POA: Insufficient documentation

## 2011-08-02 DIAGNOSIS — D693 Immune thrombocytopenic purpura: Secondary | ICD-10-CM | POA: Insufficient documentation

## 2011-08-02 DIAGNOSIS — Z9884 Bariatric surgery status: Secondary | ICD-10-CM | POA: Insufficient documentation

## 2011-08-02 DIAGNOSIS — I10 Essential (primary) hypertension: Secondary | ICD-10-CM | POA: Insufficient documentation

## 2011-08-02 DIAGNOSIS — K811 Chronic cholecystitis: Secondary | ICD-10-CM

## 2011-08-02 HISTORY — PX: CHOLECYSTECTOMY: SHX55

## 2011-08-02 LAB — CBC
MCH: 30.6 pg (ref 26.0–34.0)
MCHC: 33.8 g/dL (ref 30.0–36.0)
Platelets: 150 10*3/uL (ref 150–400)
RBC: 4.34 MIL/uL (ref 3.87–5.11)

## 2011-08-02 LAB — CREATININE, SERUM
Creatinine, Ser: 0.74 mg/dL (ref 0.50–1.10)
GFR calc non Af Amer: >90 mL/min (ref >90)

## 2011-08-02 SURGERY — LAPAROSCOPIC CHOLECYSTECTOMY WITH INTRAOPERATIVE CHOLANGIOGRAM
Anesthesia: General | Site: Abdomen | Wound class: Clean Contaminated

## 2011-08-02 MED ORDER — BENAZEPRIL HCL 20 MG PO TABS
20.0000 mg | ORAL_TABLET | Freq: Every day | ORAL | Status: DC
  Administered 2011-08-02 – 2011-08-03 (×2): 20 mg via ORAL
  Filled 2011-08-02 (×2): qty 1

## 2011-08-02 MED ORDER — SIMVASTATIN 20 MG PO TABS
20.0000 mg | ORAL_TABLET | Freq: Every day | ORAL | Status: DC
Start: 2011-08-02 — End: 2011-08-03
  Administered 2011-08-02: 20 mg via ORAL
  Filled 2011-08-02 (×2): qty 1

## 2011-08-02 MED ORDER — HEPARIN SODIUM (PORCINE) 5000 UNIT/ML IJ SOLN
5000.0000 [IU] | Freq: Once | INTRAMUSCULAR | Status: AC
  Administered 2011-08-02: 5000 [IU] via SUBCUTANEOUS

## 2011-08-02 MED ORDER — EPINEPHRINE HCL 0.1 MG/ML IJ SOLN
INTRAMUSCULAR | Status: DC | PRN
  Administered 2011-08-02: 20 ug via INTRAVENOUS

## 2011-08-02 MED ORDER — MEPERIDINE HCL 50 MG/ML IJ SOLN: 6.2500 mg | INTRAMUSCULAR | Status: DC | PRN

## 2011-08-02 MED ORDER — HYDROMORPHONE HCL PF 1 MG/ML IJ SOLN
0.2500 mg | INTRAMUSCULAR | Status: DC | PRN
  Administered 2011-08-02 (×4): 0.5 mg via INTRAVENOUS

## 2011-08-02 MED ORDER — HYDROMORPHONE HCL PF 1 MG/ML IJ SOLN
INTRAMUSCULAR | Status: AC
  Filled 2011-08-02: qty 2

## 2011-08-02 MED ORDER — ROCURONIUM BROMIDE 100 MG/10ML IV SOLN
INTRAVENOUS | Status: DC | PRN
  Administered 2011-08-02: 5 mg via INTRAVENOUS

## 2011-08-02 MED ORDER — HYDROMORPHONE HCL PF 1 MG/ML IJ SOLN: 0.5000 mg | INTRAMUSCULAR | Status: DC | PRN

## 2011-08-02 MED ORDER — CEFAZOLIN SODIUM-DEXTROSE 2-3 GM-% IV SOLR
2.0000 g | INTRAVENOUS | Status: AC
  Administered 2011-08-02: 2 g via INTRAVENOUS

## 2011-08-02 MED ORDER — EPHEDRINE SULFATE 50 MG/ML IJ SOLN
INTRAMUSCULAR | Status: DC | PRN
  Administered 2011-08-02 (×2): 15 mg via INTRAVENOUS
  Administered 2011-08-02: 10 mg via INTRAVENOUS

## 2011-08-02 MED ORDER — SUCCINYLCHOLINE CHLORIDE 20 MG/ML IJ SOLN
INTRAMUSCULAR | Status: DC | PRN
  Administered 2011-08-02: 100 mg via INTRAVENOUS

## 2011-08-02 MED ORDER — SODIUM CHLORIDE 0.9 % IJ SOLN
INTRAMUSCULAR | Status: DC | PRN
  Administered 2011-08-02: 20 mL

## 2011-08-02 MED ORDER — FENTANYL CITRATE 0.05 MG/ML IJ SOLN
INTRAMUSCULAR | Status: DC | PRN
  Administered 2011-08-02: 50 ug via INTRAVENOUS
  Administered 2011-08-02: 100 ug via INTRAVENOUS
  Administered 2011-08-02: 50 ug via INTRAVENOUS

## 2011-08-02 MED ORDER — IOHEXOL 300 MG/ML  SOLN
INTRAMUSCULAR | Status: DC | PRN
  Administered 2011-08-02: 5 mL

## 2011-08-02 MED ORDER — ATENOLOL 50 MG PO TABS
50.0000 mg | ORAL_TABLET | Freq: Two times a day (BID) | ORAL | Status: DC
  Administered 2011-08-02 – 2011-08-03 (×2): 50 mg via ORAL
  Filled 2011-08-02 (×4): qty 1

## 2011-08-02 MED ORDER — KCL IN DEXTROSE-NACL 20-5-0.45 MEQ/L-%-% IV SOLN
INTRAVENOUS | Status: AC
  Filled 2011-08-02: qty 1000

## 2011-08-02 MED ORDER — LACTATED RINGERS IV SOLN
INTRAVENOUS | Status: DC
  Administered 2011-08-02: 1000 mL via INTRAVENOUS

## 2011-08-02 MED ORDER — ATROPINE SULFATE 0.4 MG/ML IJ SOLN
INTRAMUSCULAR | Status: DC | PRN
  Administered 2011-08-02: 0.4 mg via INTRAVENOUS

## 2011-08-02 MED ORDER — GLYCOPYRROLATE 0.2 MG/ML IJ SOLN
INTRAMUSCULAR | Status: DC | PRN
  Administered 2011-08-02: 0.2 mg via INTRAVENOUS
  Administered 2011-08-02: .8 mg via INTRAVENOUS

## 2011-08-02 MED ORDER — LACTATED RINGERS IV SOLN: INTRAVENOUS | Status: DC

## 2011-08-02 MED ORDER — HYDROCHLOROTHIAZIDE 12.5 MG PO CAPS
12.5000 mg | ORAL_CAPSULE | Freq: Every day | ORAL | Status: DC
  Administered 2011-08-02 – 2011-08-03 (×2): 12.5 mg via ORAL
  Filled 2011-08-02 (×2): qty 1

## 2011-08-02 MED ORDER — HEPARIN SODIUM (PORCINE) 5000 UNIT/ML IJ SOLN
INTRAMUSCULAR | Status: AC
  Filled 2011-08-02: qty 1

## 2011-08-02 MED ORDER — BUPIVACAINE LIPOSOME 1.3 % IJ SUSP
20.0000 mL | Freq: Once | INTRAMUSCULAR | Status: AC
  Administered 2011-08-02: 20 mL
  Filled 2011-08-02: qty 20

## 2011-08-02 MED ORDER — BENAZEPRIL-HYDROCHLOROTHIAZIDE 20-12.5 MG PO TABS: 1.0000 | ORAL_TABLET | Freq: Every day | ORAL | Status: DC

## 2011-08-02 MED ORDER — ONDANSETRON HCL 4 MG/2ML IJ SOLN
INTRAMUSCULAR | Status: DC | PRN
  Administered 2011-08-02: 4 mg via INTRAVENOUS

## 2011-08-02 MED ORDER — HYDROCODONE-ACETAMINOPHEN 5-325 MG PO TABS
1.0000 | ORAL_TABLET | ORAL | Status: DC | PRN
  Administered 2011-08-02 (×3): 2 via ORAL
  Filled 2011-08-02 (×2): qty 2

## 2011-08-02 MED ORDER — IOHEXOL 300 MG/ML  SOLN
INTRAMUSCULAR | Status: AC
  Filled 2011-08-02: qty 1

## 2011-08-02 MED ORDER — PROMETHAZINE HCL 25 MG/ML IJ SOLN: 6.2500 mg | INTRAMUSCULAR | Status: DC | PRN

## 2011-08-02 MED ORDER — CEFAZOLIN SODIUM-DEXTROSE 2-3 GM-% IV SOLR
INTRAVENOUS | Status: AC
  Filled 2011-08-02: qty 50

## 2011-08-02 MED ORDER — ROCURONIUM BROMIDE 100 MG/10ML IV SOLN
INTRAVENOUS | Status: DC | PRN
  Administered 2011-08-02: 25 mg via INTRAVENOUS
  Administered 2011-08-02: 20 mg via INTRAVENOUS

## 2011-08-02 MED ORDER — HYDROCODONE-ACETAMINOPHEN 5-325 MG PO TABS
ORAL_TABLET | ORAL | Status: AC
  Filled 2011-08-02: qty 2

## 2011-08-02 MED ORDER — PROPOFOL 10 MG/ML IV BOLUS
INTRAVENOUS | Status: DC | PRN
  Administered 2011-08-02: 200 mg via INTRAVENOUS

## 2011-08-02 MED ORDER — MIDAZOLAM HCL 5 MG/5ML IJ SOLN
INTRAMUSCULAR | Status: DC | PRN
  Administered 2011-08-02: 2 mg via INTRAVENOUS

## 2011-08-02 MED ORDER — LIDOCAINE HCL (CARDIAC) 20 MG/ML IV SOLN
INTRAVENOUS | Status: DC | PRN
  Administered 2011-08-02: 30 mg via INTRAVENOUS

## 2011-08-02 MED ORDER — LACTATED RINGERS IR SOLN
Status: DC | PRN
  Administered 2011-08-02: 1000 mL

## 2011-08-02 MED ORDER — FLEET ENEMA 7-19 GM/118ML RE ENEM: 1.0000 | ENEMA | Freq: Once | RECTAL | Status: DC

## 2011-08-02 MED ORDER — SODIUM CHLORIDE 0.9 % IR SOLN
Status: DC | PRN
  Administered 2011-08-02: 1000 mL

## 2011-08-02 MED ORDER — NEOSTIGMINE METHYLSULFATE 1 MG/ML IJ SOLN
INTRAMUSCULAR | Status: DC | PRN
  Administered 2011-08-02: 5 mg via INTRAVENOUS

## 2011-08-02 MED ORDER — HEPARIN SODIUM (PORCINE) 5000 UNIT/ML IJ SOLN
5000.0000 [IU] | Freq: Three times a day (TID) | INTRAMUSCULAR | Status: DC
  Administered 2011-08-02 – 2011-08-03 (×2): 5000 [IU] via SUBCUTANEOUS
  Filled 2011-08-02 (×6): qty 1

## 2011-08-02 MED ORDER — KCL IN DEXTROSE-NACL 20-5-0.45 MEQ/L-%-% IV SOLN
INTRAVENOUS | Status: DC
  Administered 2011-08-02 – 2011-08-03 (×3): via INTRAVENOUS
  Filled 2011-08-02 (×4): qty 1000

## 2011-08-02 SURGICAL SUPPLY — 48 items
APPLIER CLIP 5 13 M/L LIGAMAX5 (MISCELLANEOUS) ×3
APPLIER CLIP ROT 10 11.4 M/L (STAPLE)
BENZOIN TINCTURE PRP APPL 2/3 (GAUZE/BANDAGES/DRESSINGS) IMPLANT
CABLE HIGH FREQUENCY MONO STRZ (ELECTRODE) ×3 IMPLANT
CANISTER SUCTION 2500CC (MISCELLANEOUS) ×3 IMPLANT
CATH REDDICK CHOLANGI 4FR 50CM (CATHETERS) ×3 IMPLANT
CLIP APPLIE 5 13 M/L LIGAMAX5 (MISCELLANEOUS) ×2 IMPLANT
CLIP APPLIE ROT 10 11.4 M/L (STAPLE) IMPLANT
CLOTH BEACON ORANGE TIMEOUT ST (SAFETY) ×3 IMPLANT
COVER MAYO STAND STRL (DRAPES) ×3 IMPLANT
COVER SURGICAL LIGHT HANDLE (MISCELLANEOUS) ×3 IMPLANT
DECANTER SPIKE VIAL GLASS SM (MISCELLANEOUS) IMPLANT
DRAPE C-ARM 42X72 X-RAY (DRAPES) ×3 IMPLANT
DRAPE LAPAROSCOPIC ABDOMINAL (DRAPES) ×3 IMPLANT
ELECT REM PT RETURN 9FT ADLT (ELECTROSURGICAL) ×3
ELECTRODE REM PT RTRN 9FT ADLT (ELECTROSURGICAL) ×2 IMPLANT
GLOVE BIOGEL M 8.0 STRL (GLOVE) ×3 IMPLANT
GLOVE BIOGEL PI IND STRL 7.0 (GLOVE) ×2 IMPLANT
GLOVE BIOGEL PI INDICATOR 7.0 (GLOVE) ×1
GOWN STRL NON-REIN LRG LVL3 (GOWN DISPOSABLE) ×3 IMPLANT
GOWN STRL REIN XL XLG (GOWN DISPOSABLE) ×6 IMPLANT
HAND ACTIVATED (MISCELLANEOUS) IMPLANT
HEMOSTAT SURGICEL 4X8 (HEMOSTASIS) IMPLANT
IV CATH 14GX2 1/4 (CATHETERS) ×3 IMPLANT
KIT BASIN OR (CUSTOM PROCEDURE TRAY) ×3 IMPLANT
NS IRRIG 1000ML POUR BTL (IV SOLUTION) ×3 IMPLANT
POUCH SPECIMEN RETRIEVAL 10MM (ENDOMECHANICALS) ×3 IMPLANT
SCISSORS LAP 5X35 DISP (ENDOMECHANICALS) ×3 IMPLANT
SET IRRIG TUBING LAPAROSCOPIC (IRRIGATION / IRRIGATOR) ×3 IMPLANT
SLEEVE Z-THREAD 5X100MM (TROCAR) IMPLANT
SOLUTION ANTI FOG 6CC (MISCELLANEOUS) ×3 IMPLANT
SPONGE GAUZE 4X4 12PLY (GAUZE/BANDAGES/DRESSINGS) ×3 IMPLANT
STRIP CLOSURE SKIN 1/2X4 (GAUZE/BANDAGES/DRESSINGS) ×3 IMPLANT
SUT VIC AB 4-0 SH 18 (SUTURE) ×3 IMPLANT
SYR 30ML LL (SYRINGE) ×6 IMPLANT
TAPE CLOTH SURG 4X10 WHT LF (GAUZE/BANDAGES/DRESSINGS) ×3 IMPLANT
TOWEL NATURAL 10PK STERILE (DISPOSABLE) ×3 IMPLANT
TOWEL OR 17X26 10 PK STRL BLUE (TOWEL DISPOSABLE) ×6 IMPLANT
TRAY FOLEY CATH 14FRSI W/METER (CATHETERS) IMPLANT
TRAY LAP CHOLE (CUSTOM PROCEDURE TRAY) ×3 IMPLANT
TROCAR BLADELESS OPT 5 75 (ENDOMECHANICALS) IMPLANT
TROCAR HASSON GELL 12X100 (TROCAR) IMPLANT
TROCAR XCEL BLUNT TIP 100MML (ENDOMECHANICALS) IMPLANT
TROCAR XCEL NON-BLD 11X100MML (ENDOMECHANICALS) IMPLANT
TROCAR Z-THREAD FIOS 11X100 BL (TROCAR) ×6 IMPLANT
TROCAR Z-THREAD FIOS 5X100MM (TROCAR) ×12 IMPLANT
TROCAR Z-THREAD SLEEVE 11X100 (TROCAR) IMPLANT
TUBING INSUFFLATION 10FT LAP (TUBING) ×3 IMPLANT

## 2011-08-02 NOTE — Anesthesia Postprocedure Evaluation (Signed)
  Anesthesia Post-op Note  Patient: Lauren [name]  Procedure(s) Performed: Procedure(s) (LRB): LAPAROSCOPIC CHOLECYSTECTOMY WITH INTRAOPERATIVE CHOLANGIOGRAM (N/A)  Patient Location: PACU  Anesthesia Type: General  Level of Consciousness: awake and alert   Airway and Oxygen Therapy: Patient Spontanous Breathing  Post-op Pain: mild  Post-op Assessment: Post-op Vital signs reviewed, Patient's Cardiovascular Status Stable, Respiratory Function Stable, Patent Airway and No signs of Nausea or vomiting  Post-op Vital Signs: stable  Complications: No apparent anesthesia complications

## 2011-08-02 NOTE — Preoperative (Signed)
Beta Blockers   Reason not to administer Beta Blockers:Not Applicable  Took atenolol this am

## 2011-08-02 NOTE — Anesthesia Preprocedure Evaluation (Addendum)
Anesthesia Evaluation  Patient identified by MRN, date of birth, ID band Patient awake    Reviewed: Allergy & Precautions, H&P , NPO status , Patient's Chart, lab work & pertinent test results  Airway Mallampati: II TM Distance: >3 FB Neck ROM: Full    Dental No notable dental hx.    Pulmonary neg pulmonary ROS,  breath sounds clear to auscultation  Pulmonary exam normal       Cardiovascular hypertension, Pt. on medications negative cardio ROS  Rhythm:Regular Rate:Normal     Neuro/Psych negative neurological ROS  negative psych ROS   GI/Hepatic negative GI ROS, Neg liver ROS,   Endo/Other  negative endocrine ROS  Renal/GU negative Renal ROS  negative genitourinary   Musculoskeletal negative musculoskeletal ROS (+)   Abdominal   Peds negative pediatric ROS (+)  Hematology negative hematology ROS (+) H/o ITP   Anesthesia Other Findings   Reproductive/Obstetrics negative OB ROS                           Anesthesia Physical Anesthesia Plan  ASA: II  Anesthesia Plan: General   Post-op Pain Management:    Induction: Intravenous  Airway Management Planned: Oral ETT  Additional Equipment:   Intra-op Plan:   Post-operative Plan: Extubation in OR  Informed Consent: I have reviewed the patients History and Physical, chart, labs and discussed the procedure including the risks, benefits and alternatives for the proposed anesthesia with the patient or authorized representative who has indicated his/her understanding and acceptance.   Dental advisory given  Plan Discussed with: CRNA  Anesthesia Plan Comments:         Anesthesia Quick Evaluation

## 2011-08-02 NOTE — Transfer of Care (Signed)
Immediate Anesthesia Transfer of Care Note  Patient: Lauren [name]  Procedure(s) Performed: Procedure(s) (LRB): LAPAROSCOPIC CHOLECYSTECTOMY WITH INTRAOPERATIVE CHOLANGIOGRAM (N/A)  Patient Location: PACU  Anesthesia Type: General  Level of Consciousness: alert , oriented, patient cooperative and responds to stimulation  Airway & Oxygen Therapy: Patient Spontanous Breathing  Post-op Assessment: Report given to PACU RN  Post vital signs: Reviewed and stable  Complications: No apparent anesthesia complications

## 2011-08-02 NOTE — Op Note (Signed)
Lauren [name] @date @  Procedure: Laparoscopic Cholecystectomy with intraoperative cholangiogram  Surgeon: Wenda Low, MD, FACS Asst:  Ovidio Kin M.D. FACS  Anes:  General  Drains: None  Findings: Chronic cholecystitis with cholelithiasis, normal intraoperative cholangiogram, lap band sited well, no evidence of recurrent GIST  Description of Procedure: The patient was taken to OR 1 and given general anesthesia.  The patient was prepped with PCMX and draped sterilely. A time out was performed.  Access to the abdomen was achieved with a 5 mm Optiview through the right upper quadrant.Marland Kitchen  Port placement included 3 fives and a 11 up in the upper midline and initially I placed a 5 in the upper site on the left when I was inspecting her lap band..    The gallbladder was visualized and the fundus was grasped and the gallbladder was elevated. Traction on the infundibulum allowed for successful demonstration of the critical view. Inflammatory changes were chronic and minimal with attachments to the duodenum. These were swept away. The peritoneum overlying the Kalos triangle was incised with the hook electrocautery.  The cystic duct was identified and clipped up on the gallbladder and an incision was made in the cystic duct and the Reddick catheter was inserted after milking the cystic duct of any debris. A dynamic cholangiogram was performed which demonstrated a plump common bile duct with a long cystic duct and nice tapering of the distal common bile duct with flow into the duodenum. Intrahepatic radicals were noted..    The cystic duct was then triple clipped and divided, the cystic artery was double clipped and divided and then the gallbladder was removed from the gallbladder bed. Removal of the gallbladder from the gallbladder bed was using a bag through the upper midline 11 mm port..  The gallbladder was then placed in a bag and brought out through one of the 10 mm trocar sites. The gallbladder bed  was inspected and no bleeding or bile leaks were seen.   Next a 5 mm was placed in the upper abdomen the left and we exposed lap band. The [place] the staple line where the previous excision to just occurred. There is no evidence of any nodules or any evidence of any count recurrence. The band appeared to be sided well with the plication placed. The buckle was noted to be buckled in the tubing appeared to be appropriately attached.  Laparoscopic visualization was used when closing fascial defects for trocar sites.   Incisions were injected with Exparel that had been diluted to 40 cc. and closed with 4-0 Vicryl and benzoin and Steri-Strips on the skin.  Sponge and needle count were correct.    The patient was taken to the recovery room in satisfactory condition.

## 2011-08-03 LAB — CBC
HCT: 37.7 % (ref 36.0–46.0)
Hemoglobin: 12.4 g/dL (ref 12.0–15.0)
RBC: 4.11 MIL/uL (ref 3.87–5.11)
WBC: 5.4 10*3/uL (ref 4.0–10.5)

## 2011-08-03 LAB — COMPREHENSIVE METABOLIC PANEL
Albumin: 3.3 g/dL — ABNORMAL LOW (ref 3.5–5.2)
BUN: 9 mg/dL (ref 6–23)
Calcium: 8.8 mg/dL (ref 8.4–10.5)
GFR calc Af Amer: >90 mL/min (ref >90)
Glucose, Bld: 97 mg/dL (ref 70–99)
Total Protein: 6.2 g/dL (ref 6.0–8.3)

## 2011-08-03 MED ORDER — HYDROCODONE-ACETAMINOPHEN 5-325 MG PO TABS: 1.0000 | ORAL_TABLET | ORAL | Status: AC | PRN

## 2011-08-03 NOTE — Progress Notes (Signed)
Patient being discharged.  Removed IV from patient's left wrist area.  Reviewed discharge instructions with patient.  Assessed for questions and concerns.  No questions or concerns from patient at this time.  Doctor gave patient a prescription.  Husband picked up patient in the main lobby.  Patient escorted to the main lobby by wheelchair.  Patient discharged.

## 2011-08-04 ENCOUNTER — Encounter (HOSPITAL_COMMUNITY): Payer: Self-pay | Admitting: Surgery

## 2011-08-09 ENCOUNTER — Telehealth (INDEPENDENT_AMBULATORY_CARE_PROVIDER_SITE_OTHER): Payer: Self-pay | Admitting: General Surgery

## 2011-08-09 NOTE — Telephone Encounter (Signed)
The patient stated that she needed an appt for post op lap chole after the 10th of July and on the same day a fill for her lap band. Unable to accommodate her schedule because she works out of town so much. She asked me to leave the appt as scheduled for 08/17/11 and she will try to be here/

## 2011-08-09 NOTE — Telephone Encounter (Signed)
Message copied by Latricia Heft on Tue Aug 09, 2011  5:21 PM ------      Message from: Erin Sons      Created: Tue Aug 09, 2011 12:07 PM      Contact: [phone]       Pt want to cancel appt for 7/3  And need you to r/s for another day.She stated she needs to be seen for lapband fill and not for PO of lap chole.Marland Kitchen Please call her at [phone].

## 2011-08-13 NOTE — Discharge Summary (Signed)
Physician Discharge Summary  Patient ID: LINCY BELLES MRN: [id number] DOB/AGE: Oct 05, 1955 56 y.o.  Admit date: 08/02/2011 Discharge date: 08/03/2011  Admission Diagnoses:  Cholecystitis in Lapband Patient  Discharge Diagnoses:  same Active Problems:  * No active hospital problems. *    Surgery:  Laparoscopic cholecystectomy with IOC  Discharged Condition: improved  Hospital Course:   Had surgery and was kept overnight and discharged home  Consults: none  Significant Diagnostic Studies: none    Discharge Exam: Blood pressure 102/60, pulse 76, temperature 97.4 F (36.3 C), temperature source Oral, resp. rate 20, height 5\' 6"  (1.676 m), weight 247 lb 0.6 oz (112.057 kg), SpO2 97.00%. Minimal soreness  Disposition: 01-Home or Self Care  Discharge Orders    Future Appointments: Provider: Department: Dept Phone: Center:   08/17/2011 3:30 PM Valarie Merino, MD [facility] [phone] None     Future Orders Please Complete By Expires   Diet - low sodium heart healthy      Increase activity slowly      Discharge instructions      Comments:   Diet ad lib Shower tomorrow   No dressing needed        Medication List  As of 08/13/2011  6:25 AM   TAKE these medications         aspirin 81 MG tablet   Take 81 mg by mouth daily.      atenolol 50 MG tablet   Commonly known as: TENORMIN   Take 50 mg by mouth 2 (two) times daily.      benazepril-hydrochlorthiazide 20-12.5 MG per tablet   Commonly known as: LOTENSIN HCT   Take 1 tablet by mouth daily.      docusate sodium 100 MG capsule   Commonly known as: COLACE   Take 100 mg by mouth daily.      HYDROcodone-acetaminophen 5-325 MG per tablet   Commonly known as: NORCO   Take 1-2 tablets by mouth every 4 (four) hours as needed.      multivitamin-iron-minerals-folic acid chewable tablet   Chew 1 tablet by mouth daily.      simvastatin 20 MG tablet   Commonly known as: ZOCOR   Take 20 mg by mouth every  evening.             SignedValarie Merino 08/13/2011, 6:25 AM

## 2011-08-17 ENCOUNTER — Encounter (INDEPENDENT_AMBULATORY_CARE_PROVIDER_SITE_OTHER): Payer: Self-pay | Admitting: Surgery

## 2011-08-17 ENCOUNTER — Ambulatory Visit (INDEPENDENT_AMBULATORY_CARE_PROVIDER_SITE_OTHER): Payer: BC Managed Care – PPO | Admitting: Surgery

## 2011-08-17 VITALS — BP 132/84 | HR 68 | Temp 97.2°F | Resp 14 | Ht 66.5 in | Wt 244.4 lb

## 2011-08-17 DIAGNOSIS — K811 Chronic cholecystitis: Secondary | ICD-10-CM

## 2011-08-17 NOTE — Patient Instructions (Addendum)
Followup for a fill next week

## 2011-08-17 NOTE — Progress Notes (Signed)
Lauren [name] 56 y.o.  Body mass index is 38.86 kg/(m^2).  Patient Active Problem List  Diagnosis  . Lapband APS April 2010  . 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 Multinodular goiter  . Chronic cholecystitis    No Known Allergies  Past Surgical History  Procedure Date  . Laparoscopic gastric banding 05/19/2008  . Lumbar disc surgery 03/21/1996  . Abdominal hysterectomy 11-18-1998    partial  . Cholecystectomy 08/02/2011    Procedure: LAPAROSCOPIC CHOLECYSTECTOMY WITH INTRAOPERATIVE CHOLANGIOGRAM;  Surgeon: Valarie Merino, MD;  Location: [facility];  Service: General;  Laterality: N/A;   Elie Confer, MD No diagnosis found.  Doing well after laparoscopic cholecystectomy. Has had a couple episodes of vomiting that sound like they may be been related after eating. She is headed to the beach this afternoon. I think that a fill at this time is not advisable. We'll see her back next week after she returns from [facility]. Matt B. Daphine Deutscher, MD, [facility] [phone] beeper [phone]  08/17/2011 4:33 PM

## 2011-08-24 ENCOUNTER — Encounter (INDEPENDENT_AMBULATORY_CARE_PROVIDER_SITE_OTHER): Payer: Self-pay | Admitting: Surgery

## 2011-08-24 ENCOUNTER — Ambulatory Visit (INDEPENDENT_AMBULATORY_CARE_PROVIDER_SITE_OTHER): Payer: BC Managed Care – PPO | Admitting: Surgery

## 2011-08-24 VITALS — Ht 66.5 in | Wt 245.4 lb

## 2011-08-24 DIAGNOSIS — Z9884 Bariatric surgery status: Secondary | ICD-10-CM

## 2011-08-24 NOTE — Progress Notes (Signed)
Lauren [name] Body mass index is 39.01 kg/(m^2).  Having regurgitation:  no  Nocturnal reflux?  no  Amount of fill  .25 cc

## 2011-08-24 NOTE — Patient Instructions (Addendum)

## 2011-10-14 ENCOUNTER — Encounter (INDEPENDENT_AMBULATORY_CARE_PROVIDER_SITE_OTHER): Payer: BC Managed Care – PPO | Admitting: Surgery

## 2011-11-11 ENCOUNTER — Other Ambulatory Visit: Payer: Self-pay | Admitting: Family Medicine

## 2011-11-11 DIAGNOSIS — Z1231 Encounter for screening mammogram for malignant neoplasm of breast: Secondary | ICD-10-CM

## 2011-11-17 ENCOUNTER — Ambulatory Visit
Admission: RE | Admit: 2011-11-17 | Discharge: 2011-11-17 | Disposition: A | Discharge status: Home / Self Care | Payer: BC Managed Care – PPO | Source: Ambulatory Visit | Attending: Family Medicine | Admitting: Family Medicine

## 2011-11-17 DIAGNOSIS — Z1231 Encounter for screening mammogram for malignant neoplasm of breast: Secondary | ICD-10-CM

## 2012-06-11 ENCOUNTER — Other Ambulatory Visit: Payer: Self-pay | Admitting: Family Medicine

## 2012-06-11 DIAGNOSIS — E042 Nontoxic multinodular goiter: Secondary | ICD-10-CM

## 2012-06-22 ENCOUNTER — Other Ambulatory Visit: Payer: BC Managed Care – PPO

## 2012-06-29 ENCOUNTER — Ambulatory Visit
Admission: RE | Admit: 2012-06-29 | Discharge: 2012-06-29 | Disposition: A | Discharge status: Home / Self Care | Payer: BC Managed Care – PPO | Source: Ambulatory Visit | Attending: Family Medicine | Admitting: Family Medicine

## 2012-06-29 DIAGNOSIS — E042 Nontoxic multinodular goiter: Secondary | ICD-10-CM

## 2012-10-22 ENCOUNTER — Other Ambulatory Visit: Payer: Self-pay

## 2012-10-22 DIAGNOSIS — Z1231 Encounter for screening mammogram for malignant neoplasm of breast: Secondary | ICD-10-CM

## 2012-11-23 ENCOUNTER — Ambulatory Visit
Admission: RE | Admit: 2012-11-23 | Discharge: 2012-11-23 | Disposition: A | Discharge status: Home / Self Care | Payer: BC Managed Care – PPO | Source: Ambulatory Visit

## 2012-11-23 DIAGNOSIS — Z1231 Encounter for screening mammogram for malignant neoplasm of breast: Secondary | ICD-10-CM

## 2013-10-22 ENCOUNTER — Other Ambulatory Visit: Payer: Self-pay | Admitting: Gastroenterology

## 2013-11-12 ENCOUNTER — Other Ambulatory Visit: Payer: Self-pay

## 2013-11-12 DIAGNOSIS — Z1231 Encounter for screening mammogram for malignant neoplasm of breast: Secondary | ICD-10-CM

## 2013-12-06 ENCOUNTER — Ambulatory Visit
Admission: RE | Admit: 2013-12-06 | Discharge: 2013-12-06 | Disposition: A | Discharge status: Home / Self Care | Payer: BC Managed Care – PPO | Source: Ambulatory Visit

## 2013-12-06 DIAGNOSIS — Z1231 Encounter for screening mammogram for malignant neoplasm of breast: Secondary | ICD-10-CM

## 2014-04-14 ENCOUNTER — Encounter: Payer: Self-pay | Admitting: Podiatry

## 2014-04-14 ENCOUNTER — Ambulatory Visit (INDEPENDENT_AMBULATORY_CARE_PROVIDER_SITE_OTHER): Payer: BLUE CROSS/BLUE SHIELD | Admitting: Podiatry

## 2014-04-14 ENCOUNTER — Ambulatory Visit (INDEPENDENT_AMBULATORY_CARE_PROVIDER_SITE_OTHER): Payer: BLUE CROSS/BLUE SHIELD

## 2014-04-14 VITALS — BP 151/91 | HR 84

## 2014-04-14 DIAGNOSIS — M2141 Flat foot [pes planus] (acquired), right foot: Secondary | ICD-10-CM

## 2014-04-14 DIAGNOSIS — M2142 Flat foot [pes planus] (acquired), left foot: Secondary | ICD-10-CM

## 2014-04-14 DIAGNOSIS — M79673 Pain in unspecified foot: Secondary | ICD-10-CM

## 2014-04-14 NOTE — Patient Instructions (Signed)
Flat Feet Having flat feet is a common condition. One foot or both might be affected. People of any age can have flat feet. In fact, everyone is born with them. But most of the time, the foot gradually develops an arch. That is the curve on the bottom of the foot that creates a gap between the foot and the ground. An arch usually develops in childhood. Sometimes, though, an arch never develops and the foot stays flat on the bottom. Other times, an arch develops but later collapses (caves in). That is what gives the condition its nickname, "fallen arches." The medical term for flat feet is pes planus. Some people have flat feet their whole life and have no problems. For others, the condition causes pain and needs to be corrected.  CAUSES   A problem with the foot's soft tissue; tendons and ligaments could be loose.  This can cause what is called flexible flat feet. That means the shape of the foot changes with pressure. When standing on the toes, a curved arch can be seen. When standing on the ground, the foot is flat.  Wear and tear. Sometimes arches simply flatten over time.  Damage to the posterior tibial tendon. This is the tendon that goes from the inside of the ankle to the bones in the middle of the foot. It is the main support for the arch. If the tendon is injured, stretched or torn, the arch might flatten.  Tarsal coalition. With this condition, two or more bones in the foot are joined together (fused ) during development in the womb. This limits movement and can lead to a flat foot. SYMPTOMS   The foot is even with the ground from toe to heel. Your caregiver will look closely at the inside of the foot while you are standing.  Pain along the bottom of the foot. Some people describe the pain as tightness.  Swelling on the inside of the foot or ankle.  Changes in the way you walk (gait).  The feet lean inward, starting at the ankle (pronation). DIAGNOSIS  To decide if a child or  adult has flat feet, a healthcare provider will probably:  Do a physical examination. This might include having the person stand on his or her toes and then stand normally. The caregiver will also hold the foot and put pressure on the foot in different directions.  Check the person's shoes. The pattern of wear on the soles can offer clues.  Order images (pictures) of the foot. They can help identify the cause of any pain. They also will show injuries to bones or tendons that could be causing the condition. The images can come from:  X-rays.  Computed tomography (CT) scan. This combines X-ray and a computer.  Magnetic resonance imaging (MRI). This uses magnets, radio waves and a computer to take a picture of the foot. It is the best technique to evaluate tendons, ligaments and muscles. TREATMENT   Flexible flat feet usually are painless. Most of the time, gait is not affected. Most children grow out of the condition. Often no treatment is needed. If there is pain, treatment options include:  Orthotics. These are inserts that go in the shoes. They add support and shape to the feet. An orthotic is custom-made from a mold of the foot.  Shoes. Not all shoes are the same. People with flat feet need arch support. However, too much can be painful. It is important to find shoes that offer the right amount  of support. Athletes, especially runners, may need to try shoes made just for people with flatter feet.  Medication. For pain, only take over-the-counter medicine for pain, discomfort, as directed by your caregiver.  Rest. If the feet start to hurt, cut back on the exercise which increases the pain. Use common sense.  For damage to the posterior tibial tendon, options include:  Orthotics. Also adding a wedge on the inside edge may help. This can relieve pressure on the tendon.  Ankle brace, boot or cast. These supports can ease the load on the tendon while it heals.  Surgery. If the tendon is  torn, it might need to be repaired.  For tarsal coalition, similar options apply:  Pain medication.  Orthotics.  A cast and crutches. This keeps weight off the foot.  Physical therapy.  Surgery to remove the bone bridge joining the two bones together. PROGNOSIS  In most people, flat feet do not cause pain or problems. People can go about their normal activities. However, if flat feet are painful, they can and should be treated. Treatment usually relieves the pain. HOME CARE INSTRUCTIONS   Take any medications prescribed by the healthcare provider. Follow the directions carefully.  Wear, or make sure a child wears, orthotics or special shoes if this was suggested. Be sure to ask how often and for how long they should be worn.  Do any exercises or therapy treatments that were suggested.  Take notes on when the pain occurs. This will help healthcare providers decide how to treat the condition.  If surgery is needed, be sure to find out if there is anything that should or should not be done before the operation. SEEK MEDICAL CARE IF:   Pain worsens in the foot or lower leg.  Pain disappears after treatment, but then returns.  Walking or simple exercise becomes difficult or causes foot pain.  Orthotics or special shoes are uncomfortable or painful. Document Released: 11/28/2008 Document Revised: 04/25/2011 Document Reviewed: 11/28/2008 [facility] Patient Information 2015 [place], [place]. This information is not intended to replace advice given to you by your health care provider. Make sure you discuss any questions you have with your health care provider.

## 2014-04-14 NOTE — Progress Notes (Signed)
   Subjective:    Patient ID: Lauren [name], female    DOB: 1955/10/08, 59 y.o.   MRN: [id number]  HPI Comments: N foot pain and flat feet L B/L medial foot D years O worsening C pain  A walking and hip problems T using TFC rx orthotics on and off  Foot Pain   patient describes severe pain on the medial foot and ankle after standing and walking, which limits bearing activity. In the past for plantar fasciitis she has had a accommodative orthotic with a extrinsic rear foot post to neutral with first ray cutouts. These orthotics resolve the plantar fasciitis, however, it does not reduce her medial foot and ankle pain    Review of Systems  All other systems reviewed and are negative.      Objective:   Physical Exam  Orientated 3  Vascular: DP pulses 2/bilaterally PT pulses 2/4 bilaterally Capillary reflex immediate bilaterally  Neurological: Ankle reflex equal and reactive bilaterally Vibratory sensation reactive bilaterally Sensation to 10 g monofilament wire intact 5/5 bilaterally  Dermatological: Texture and turgor within normal limits  Musculoskeletal: Upon weight-bearing feet function maximally pronated position with the rear foot in a valgus position and the forefoot abducted on the rear foot. When she stands in her toes or heels invert bilaterally.  There is no restriction ankle, subtalar, midtarsal joints bilaterally  X-ray weightbearing left foot  Intact bony structure without fracture and/or dislocation Posterior and inferior calcaneal spur Wedging of the navicular Increase of calcaneal cuboid angle Pes planus  Radiographic impression Pes planus primarily transverse plane deformity        X-ray weightbearing right foot  Intact bony structure without fracture and/or dislocation Posterior and inferior calcaneal spur Wedging of the navicular Increase of calcaneal cuboid angle Pes planus  Radiographic impression Pes planus primarily  transverse plane deformity     Assessment & Plan:   Assessment: Severe flatfoot deformity bilaterally resulting in pain in limited mobility  Plan: Discuss treatment options including and inverted orthotic, possible foot ankle AFO device or surgical intervention as last resort. At this time I'm recommending that we try inverted orthotic with the understanding that it could be helpful, however, could not or sure patient that she could tolerate device. Patient would like to try this device  Scan obtained today for Poly Pro Highly inverted 15 right and left Heel cup inverted equals 18 Sulcus length top cover with 1/8 inch PPT Digital images obtained to submit to lab  Notify patient on receipt of custom orthotic

## 2014-04-23 ENCOUNTER — Other Ambulatory Visit: Payer: Self-pay | Admitting: Family Medicine

## 2014-04-23 DIAGNOSIS — E041 Nontoxic single thyroid nodule: Secondary | ICD-10-CM

## 2014-04-23 DIAGNOSIS — E049 Nontoxic goiter, unspecified: Secondary | ICD-10-CM

## 2014-04-25 ENCOUNTER — Ambulatory Visit
Admission: RE | Admit: 2014-04-25 | Discharge: 2014-04-25 | Disposition: A | Discharge status: Home / Self Care | Payer: BLUE CROSS/BLUE SHIELD | Source: Ambulatory Visit | Attending: Family Medicine | Admitting: Family Medicine

## 2014-04-25 DIAGNOSIS — E041 Nontoxic single thyroid nodule: Secondary | ICD-10-CM

## 2014-04-25 DIAGNOSIS — E049 Nontoxic goiter, unspecified: Secondary | ICD-10-CM

## 2014-05-05 ENCOUNTER — Encounter: Payer: Self-pay | Admitting: Podiatry

## 2014-05-05 ENCOUNTER — Ambulatory Visit (INDEPENDENT_AMBULATORY_CARE_PROVIDER_SITE_OTHER): Payer: BLUE CROSS/BLUE SHIELD | Admitting: Podiatry

## 2014-05-05 DIAGNOSIS — M2142 Flat foot [pes planus] (acquired), left foot: Secondary | ICD-10-CM

## 2014-05-05 DIAGNOSIS — M2141 Flat foot [pes planus] (acquired), right foot: Secondary | ICD-10-CM

## 2014-05-05 NOTE — Patient Instructions (Signed)

## 2014-05-05 NOTE — Progress Notes (Signed)
   Subjective:    Patient ID: Lauren [name], female    DOB: Jul 13, 1955, 59 y.o.   MRN: [id number]  HPI  Patient presents today for dispensing of custom foot orthotics with a history of significant flatfoot deformity and currently wearing a rigid foot orthotic with extrinsic post   Review of Systems     Objective:   Physical Exam  Custom foot orthotics Highly inverted 15 right and left Heel cup inverted equals 18 Sulcus left top cover with 1/8 inch PPT Orthotics contour satisfactorily    Assessment & Plan:   Assessment: Pes planus  Satisfactory fit of custom orthotics  Plan: I had a detailed discussion with patient today about beginning wearing these orthotics. As these orthotics are highly inverted the break in. Will be much longer. I recommended wearing orthotics only for brief periods of time as tolerated. I emphasized the importance of the correct shoe size. Patient seen to be hasn't about wearing athletic style shoes to work.  Reevaluate 6 weeks

## 2014-06-16 ENCOUNTER — Ambulatory Visit: Payer: BLUE CROSS/BLUE SHIELD | Admitting: Podiatry

## 2014-11-19 ENCOUNTER — Other Ambulatory Visit: Payer: Self-pay

## 2014-11-19 DIAGNOSIS — Z1231 Encounter for screening mammogram for malignant neoplasm of breast: Secondary | ICD-10-CM

## 2014-12-26 ENCOUNTER — Ambulatory Visit
Admission: RE | Admit: 2014-12-26 | Discharge: 2014-12-26 | Disposition: A | Discharge status: Home / Self Care | Payer: BLUE CROSS/BLUE SHIELD | Source: Ambulatory Visit

## 2014-12-26 DIAGNOSIS — Z1231 Encounter for screening mammogram for malignant neoplasm of breast: Secondary | ICD-10-CM

## 2015-04-27 ENCOUNTER — Other Ambulatory Visit: Payer: Self-pay | Admitting: Family Medicine

## 2015-04-27 DIAGNOSIS — R140 Abdominal distension (gaseous): Secondary | ICD-10-CM

## 2015-05-04 ENCOUNTER — Other Ambulatory Visit: Payer: Self-pay | Admitting: Family Medicine

## 2015-05-04 DIAGNOSIS — R5381 Other malaise: Secondary | ICD-10-CM

## 2015-05-04 DIAGNOSIS — E0590 Thyrotoxicosis, unspecified without thyrotoxic crisis or storm: Secondary | ICD-10-CM

## 2015-05-04 DIAGNOSIS — E042 Nontoxic multinodular goiter: Secondary | ICD-10-CM

## 2015-05-05 ENCOUNTER — Ambulatory Visit
Admission: RE | Admit: 2015-05-05 | Discharge: 2015-05-05 | Disposition: A | Discharge status: Home / Self Care | Payer: Self-pay | Source: Ambulatory Visit | Attending: Family Medicine | Admitting: Family Medicine

## 2015-05-05 ENCOUNTER — Ambulatory Visit
Admission: RE | Admit: 2015-05-05 | Discharge: 2015-05-05 | Disposition: A | Discharge status: Home / Self Care | Payer: BLUE CROSS/BLUE SHIELD | Source: Ambulatory Visit | Attending: Family Medicine | Admitting: Family Medicine

## 2015-05-05 DIAGNOSIS — R140 Abdominal distension (gaseous): Secondary | ICD-10-CM

## 2015-05-06 ENCOUNTER — Ambulatory Visit
Admission: RE | Admit: 2015-05-06 | Discharge: 2015-05-06 | Disposition: A | Discharge status: Home / Self Care | Payer: BLUE CROSS/BLUE SHIELD | Source: Ambulatory Visit | Attending: Family Medicine | Admitting: Family Medicine

## 2015-05-06 DIAGNOSIS — E0590 Thyrotoxicosis, unspecified without thyrotoxic crisis or storm: Secondary | ICD-10-CM

## 2015-05-06 DIAGNOSIS — E042 Nontoxic multinodular goiter: Secondary | ICD-10-CM

## 2015-05-22 DIAGNOSIS — R946 Abnormal results of thyroid function studies: Secondary | ICD-10-CM | POA: Diagnosis not present

## 2015-07-01 ENCOUNTER — Other Ambulatory Visit (HOSPITAL_COMMUNITY): Payer: Self-pay | Admitting: Endocrinology

## 2015-07-01 DIAGNOSIS — E0520 Thyrotoxicosis with toxic multinodular goiter without thyrotoxic crisis or storm: Secondary | ICD-10-CM | POA: Diagnosis not present

## 2015-07-15 ENCOUNTER — Ambulatory Visit (HOSPITAL_COMMUNITY)
Admission: RE | Admit: 2015-07-15 | Discharge: 2015-07-15 | Disposition: A | Discharge status: Home / Self Care | Payer: BLUE CROSS/BLUE SHIELD | Source: Ambulatory Visit | Attending: Endocrinology | Admitting: Endocrinology

## 2015-07-15 DIAGNOSIS — E049 Nontoxic goiter, unspecified: Secondary | ICD-10-CM | POA: Insufficient documentation

## 2015-07-15 DIAGNOSIS — E0520 Thyrotoxicosis with toxic multinodular goiter without thyrotoxic crisis or storm: Secondary | ICD-10-CM | POA: Insufficient documentation

## 2015-07-15 MED ORDER — SODIUM IODIDE I 131 CAPSULE
10.0000 | Freq: Once | INTRAVENOUS | Status: AC | PRN
  Administered 2015-07-15: 10 via ORAL

## 2015-07-16 ENCOUNTER — Encounter (HOSPITAL_COMMUNITY)
Admission: RE | Admit: 2015-07-16 | Discharge: 2015-07-16 | Disposition: A | Discharge status: Home / Self Care | Payer: BLUE CROSS/BLUE SHIELD | Source: Ambulatory Visit | Attending: Endocrinology | Admitting: Endocrinology

## 2015-07-16 DIAGNOSIS — E049 Nontoxic goiter, unspecified: Secondary | ICD-10-CM | POA: Diagnosis not present

## 2015-07-16 DIAGNOSIS — E0520 Thyrotoxicosis with toxic multinodular goiter without thyrotoxic crisis or storm: Secondary | ICD-10-CM | POA: Diagnosis not present

## 2015-07-16 MED ORDER — SODIUM PERTECHNETATE TC 99M INJECTION
10.2000 | Freq: Once | INTRAVENOUS | Status: AC | PRN
  Administered 2015-07-16: 10 via INTRAVENOUS

## 2015-07-21 DIAGNOSIS — E049 Nontoxic goiter, unspecified: Secondary | ICD-10-CM | POA: Diagnosis not present

## 2015-07-21 DIAGNOSIS — E0520 Thyrotoxicosis with toxic multinodular goiter without thyrotoxic crisis or storm: Secondary | ICD-10-CM | POA: Diagnosis not present

## 2015-07-21 DIAGNOSIS — I10 Essential (primary) hypertension: Secondary | ICD-10-CM | POA: Diagnosis not present

## 2015-08-04 ENCOUNTER — Ambulatory Visit: Payer: Self-pay | Admitting: Surgery

## 2015-08-04 DIAGNOSIS — E0590 Thyrotoxicosis, unspecified without thyrotoxic crisis or storm: Secondary | ICD-10-CM | POA: Diagnosis not present

## 2015-08-04 DIAGNOSIS — E042 Nontoxic multinodular goiter: Secondary | ICD-10-CM | POA: Diagnosis not present

## 2015-08-20 ENCOUNTER — Other Ambulatory Visit: Payer: Self-pay | Admitting: Endocrinology

## 2015-08-20 DIAGNOSIS — N951 Menopausal and female climacteric states: Secondary | ICD-10-CM

## 2015-09-03 ENCOUNTER — Ambulatory Visit
Admission: RE | Admit: 2015-09-03 | Discharge: 2015-09-03 | Disposition: A | Discharge status: Home / Self Care | Payer: BLUE CROSS/BLUE SHIELD | Source: Ambulatory Visit | Attending: Endocrinology | Admitting: Endocrinology

## 2015-09-03 DIAGNOSIS — N951 Menopausal and female climacteric states: Secondary | ICD-10-CM

## 2015-09-03 DIAGNOSIS — Z780 Asymptomatic menopausal state: Secondary | ICD-10-CM | POA: Diagnosis not present

## 2015-09-03 DIAGNOSIS — Z13820 Encounter for screening for osteoporosis: Secondary | ICD-10-CM | POA: Diagnosis not present

## 2015-09-03 NOTE — Patient Instructions (Signed)
Lauren [name]  09/03/2015   Your procedure is scheduled on: 09/08/2015    Report to [facility] Main  Entrance take [place]  elevators to 3rd floor to  [place] at  [place] AM.  Call this number if you have problems the morning of surgery [phone]   Remember: ONLY 1 PERSON MAY GO WITH YOU TO SHORT STAY TO GET  READY MORNING OF [place].  Do not eat food or drink liquids :After Midnight.     Take these medicines the morning of surgery with A SIP OF WATER: Metoprolol ( Toprol)                                You may not have any metal on your body including hair pins and              piercings  Do not wear jewelry, make-up, lotions, powders or perfumes, deodorant             Do not wear nail polish.  Do not shave  48 hours prior to surgery.                 Do not bring valuables to the hospital. [place].  Contacts, dentures or bridgework may not be worn into surgery.  Leave suitcase in the car. After surgery it may be brought to your room.        Special Instructions: coughing and deep breathing exercises, leg exercises               Please read over the following fact sheets you were given: _____________________________________________________________________             [facility] - Preparing for Surgery Before surgery, you can play an important role.  Because skin is not sterile, your skin needs to be as free of germs as possible.  You can reduce the number of germs on your skin by washing with CHG (chlorahexidine gluconate) soap before surgery.  CHG is an antiseptic cleaner which kills germs and bonds with the skin to continue killing germs even after washing. Please DO NOT use if you have an allergy to CHG or antibacterial soaps.  If your skin becomes reddened/irritated stop using the CHG and inform your nurse when you arrive at Short Stay. Do not shave (including legs and underarms) for  at least 48 hours prior to the first CHG shower.  You may shave your face/neck. Please follow these instructions carefully:  1.  Shower with CHG Soap the night before surgery and the  morning of Surgery.  2.  If you choose to wash your hair, wash your hair first as usual with your  normal  shampoo.  3.  After you shampoo, rinse your hair and body thoroughly to remove the  shampoo.                           4.  Use CHG as you would any other liquid soap.  You can apply chg directly  to the skin and wash                       Gently  with a scrungie or clean washcloth.  5.  Apply the CHG Soap to your body ONLY FROM THE NECK DOWN.   Do not use on face/ open                           Wound or open sores. Avoid contact with eyes, ears mouth and genitals (private parts).                       Wash face,  Genitals (private parts) with your normal soap.             6.  Wash thoroughly, paying special attention to the area where your surgery  will be performed.  7.  Thoroughly rinse your body with warm water from the neck down.  8.  DO NOT shower/wash with your normal soap after using and rinsing off  the CHG Soap.                9.  Pat yourself dry with a clean towel.            10.  Wear clean pajamas.            11.  Place clean sheets on your bed the night of your first shower and do not  sleep with pets. Day of Surgery : Do not apply any lotions/deodorants the morning of surgery.  Please wear clean clothes to the hospital/surgery center.  FAILURE TO FOLLOW THESE INSTRUCTIONS MAY RESULT IN THE CANCELLATION OF YOUR SURGERY PATIENT SIGNATURE_________________________________  NURSE SIGNATURE__________________________________  ________________________________________________________________________

## 2015-09-04 ENCOUNTER — Encounter (HOSPITAL_COMMUNITY)
Admission: RE | Admit: 2015-09-04 | Discharge: 2015-09-04 | Disposition: A | Discharge status: Home / Self Care | Payer: BLUE CROSS/BLUE SHIELD | Source: Ambulatory Visit | Attending: Surgery | Admitting: Surgery

## 2015-09-04 ENCOUNTER — Encounter (HOSPITAL_COMMUNITY): Payer: Self-pay

## 2015-09-04 ENCOUNTER — Ambulatory Visit (HOSPITAL_COMMUNITY)
Admission: RE | Admit: 2015-09-04 | Discharge: 2015-09-04 | Disposition: A | Discharge status: Home / Self Care | Payer: BLUE CROSS/BLUE SHIELD | Source: Ambulatory Visit | Attending: Anesthesiology | Admitting: Anesthesiology

## 2015-09-04 ENCOUNTER — Encounter (HOSPITAL_COMMUNITY): Payer: Self-pay | Admitting: Surgery

## 2015-09-04 DIAGNOSIS — I517 Cardiomegaly: Secondary | ICD-10-CM | POA: Insufficient documentation

## 2015-09-04 DIAGNOSIS — Z01818 Encounter for other preprocedural examination: Secondary | ICD-10-CM | POA: Insufficient documentation

## 2015-09-04 DIAGNOSIS — E0590 Thyrotoxicosis, unspecified without thyrotoxic crisis or storm: Secondary | ICD-10-CM | POA: Diagnosis present

## 2015-09-04 LAB — BASIC METABOLIC PANEL
ANION GAP: 5 (ref 5–15)
BUN: 16 mg/dL (ref 6–20)
CALCIUM: 9 mg/dL (ref 8.9–10.3)
CO2: 29 mmol/L (ref 22–32)
CREATININE: 0.83 mg/dL (ref 0.44–1.00)
Chloride: 108 mmol/L (ref 101–111)
GFR calc Af Amer: >60 mL/min (ref >60)
Glucose, Bld: 107 mg/dL — ABNORMAL HIGH (ref 65–99)
Potassium: 3.6 mmol/L (ref 3.5–5.1)
Sodium: 142 mmol/L (ref 135–145)

## 2015-09-04 LAB — CBC
HCT: 40.4 % (ref 36.0–46.0)
Hemoglobin: 13.6 g/dL (ref 12.0–15.0)
MCH: 29.9 pg (ref 26.0–34.0)
MCHC: 33.7 g/dL (ref 30.0–36.0)
MCV: 88.8 fL (ref 78.0–100.0)
PLATELETS: 155 10*3/uL (ref 150–400)
RBC: 4.55 MIL/uL (ref 3.87–5.11)
RDW: 12.8 % (ref 11.5–15.5)
WBC: 4.3 10*3/uL (ref 4.0–10.5)

## 2015-09-04 NOTE — Progress Notes (Signed)
Final EKG done 09/04/15- EPIC

## 2015-09-04 NOTE — H&P (Signed)
General Surgery [facility]  Mattelyn H. [name] DOB: 1955/08/16 Married / Language: English / Race: White Female  History of Present Illness  Lauren patient is a 60 year old female who presents with hyperthyroidism.  Patient is referred by Dr. Jacelyn Pi for evaluation of multinodular goiter with hyperthyroidism. Patient's primary care physician is Dr. Maurice Small. Patient was diagnosed with multiple thyroid nodules in 2013. She has been followed with sequential ultrasound scanning showing a stable multinodular goiter. Recent ultrasound in March 2017 showed an enlarged gland with Lauren right lobe measuring 7.8 cm and left lobe measuring 7.3 cm. There were bilateral thyroid nodules which were stable and without worrisome features. Patient has not undergone previous biopsy. However her thyroid function test showed a suppressed TSH in April 2017 of 0.030. Patient is on beta blockers for hypertension and has had minimal symptoms of hyperthyroidism. Patient is now referred for consideration for thyroidectomy for management of multinodular goiter with hyperthyroidism. Patient denies any significant compressive symptoms. She denies tremors or palpitations. There is a family history of thyroid disease in Lauren patient's father with hypothyroidism and in Lauren patient's sister who underwent lobectomy for thyroid neoplasm of uncertain behavior. Patient has never been on thyroid medication. She has had no prior head or neck surgery. She is accompanied today by her husband.   Other Problems  Back Pain Cholelithiasis High blood pressure Hypercholesterolemia Thyroid Disease  Past Surgical History Colon Polyp Removal - Colonoscopy Resection of Stomach Tonsillectomy  Diagnostic Studies History Colonoscopy 1-5 years ago Mammogram within last year Pap Smear 1-5 years ago  Allergies  No Known Drug Allergies06/20/2017  Medication History  Metoprolol Tartrate  (25MG  Tablet, Oral) Active. Benazepril-Hydrochlorothiazide (20-12.5MG  Tablet, Oral) Active. Simvastatin (20MG  Tablet, Oral) Active. Aspirin (81MG  Tablet, Oral) Active. Stool Softener (100MG  Tablet, Oral) Active. Medications Reconciled  Social History  Caffeine use Coffee. No drug use  Family History Breast Cancer Family Members In General. Colon Polyps Mother. Depression Sister. Heart Disease Mother. Heart disease in female family member before age 25 Heart disease in female family member before age 75 Hypertension Brother, Father, Mother. Ischemic Bowel Disease Sister. Kidney Disease Mother. Respiratory Condition Mother. Thyroid problems Father, Sister.  Pregnancy / Birth History Age at menarche 72 years. Age of menopause 20-50 Contraceptive History Oral contraceptives. Gravida 1 Irregular periods Maternal age 52-25 Para 1  Review of Systems General Present- Fatigue. Not Present- Appetite Loss, Chills, Fever, Night Sweats, Weight Gain and Weight Loss. Respiratory Present- Snoring. Not Present- Bloody sputum, Chronic Cough, Difficulty Breathing and Wheezing. Breast Not Present- Breast Mass, Breast Pain, Nipple Discharge and Skin Changes. Cardiovascular Present- Swelling of Extremities. Not Present- Chest Pain, Difficulty Breathing Lying Down, Leg Cramps, Palpitations, Rapid Heart Rate and Shortness of Breath. Gastrointestinal Present- Bloating. Not Present- Abdominal Pain, Bloody Stool, Change in Bowel Habits, Chronic diarrhea, Constipation, Difficulty Swallowing, Excessive gas, Gets full quickly at meals, Hemorrhoids, Indigestion, Nausea, Rectal Pain and Vomiting. Female Genitourinary Not Present- Frequency, Nocturia, Painful Urination, Pelvic Pain and Urgency. Musculoskeletal Present- Joint Pain. Not Present- Back Pain, Joint Stiffness, Muscle Pain, Muscle Weakness and Swelling of Extremities. Endocrine Present- Hot flashes. Not Present- Cold  Intolerance, Excessive Hunger, Hair Changes, Heat Intolerance and New Diabetes.  Vitals Weight: 231 lb Height: 66.5in Body Surface Area: 2.14 m Body Mass Index: 36.73 kg/m  Temp.: 97.33F  Pulse: 89 (Regular)  BP: 130/82 (Sitting, Left Arm, Standard)  Physical Exam  General - appears comfortable, no distress; not diaphorectic  HEENT - normocephalic; sclerae  clear, gaze conjugate; mucous membranes moist, dentition good; voice normal  Neck - asymmetric on extension; no palpable anterior or posterior cervical adenopathy; right thyroid lobe is multinodular, slightly firm, and larger than Lauren left lobe; left lobe is relatively firm without discrete or dominant mass  Chest - clear bilaterally without rhonchi, rales, or wheeze  Cor - regular rhythm with normal rate; no significant murmur  Ext - non-tender without significant edema or lymphedema  Neuro - grossly intact; no tremor   Assessment & Plan  MULTINODULAR THYROID (E04.2) HYPERTHYROIDISM (E05.90)  Patient presents with multinodular goiter and new onset hyperthyroidism. She is provided with written literature on thyroid surgery to review at home.  I have recommended proceeding with total thyroidectomy. We discussed Lauren risk and benefits of Lauren procedure. We discussed Lauren potential for recurrent laryngeal nerve injury and injury to parathyroid glands. We discussed Lauren need for lifelong thyroid hormone replacement. We discussed Lauren postoperative recovery in time out of work. We discussed Lauren location of Lauren surgical incision and cosmetic results to be anticipated. She and her husband understand and wish to proceed with surgery in Lauren near future.  Lauren risks and benefits of Lauren procedure have been discussed at length with Lauren patient. Lauren patient understands Lauren proposed procedure, potential alternative treatments, and Lauren course of recovery to be expected. All of Lauren patient's questions have been answered at this time. Lauren  patient wishes to proceed with surgery.  Earnstine Regal, MD, [facility] Office: [phone]

## 2015-09-07 MED ORDER — DEXTROSE 5 % IV SOLN
3.0000 g | INTRAVENOUS | Status: AC
  Administered 2015-09-08: 3 g via INTRAVENOUS
  Filled 2015-09-07: qty 3

## 2015-09-08 ENCOUNTER — Ambulatory Visit (HOSPITAL_COMMUNITY): Payer: BLUE CROSS/BLUE SHIELD | Admitting: Certified Registered Nurse Anesthetist

## 2015-09-08 ENCOUNTER — Encounter (HOSPITAL_COMMUNITY)
Admission: RE | Disposition: A | Discharge status: Home / Self Care | Payer: Self-pay | Source: Ambulatory Visit | Attending: Surgery

## 2015-09-08 ENCOUNTER — Observation Stay (HOSPITAL_COMMUNITY)
Admission: RE | Admit: 2015-09-08 | Discharge: 2015-09-09 | Disposition: A | Discharge status: Home / Self Care | Payer: BLUE CROSS/BLUE SHIELD | Source: Ambulatory Visit | Attending: Surgery | Admitting: Surgery

## 2015-09-08 ENCOUNTER — Encounter (HOSPITAL_COMMUNITY): Payer: Self-pay | Admitting: *Deleted

## 2015-09-08 DIAGNOSIS — E0520 Thyrotoxicosis with toxic multinodular goiter without thyrotoxic crisis or storm: Secondary | ICD-10-CM | POA: Diagnosis present

## 2015-09-08 DIAGNOSIS — Z7982 Long term (current) use of aspirin: Secondary | ICD-10-CM | POA: Diagnosis not present

## 2015-09-08 DIAGNOSIS — E049 Nontoxic goiter, unspecified: Secondary | ICD-10-CM | POA: Diagnosis present

## 2015-09-08 DIAGNOSIS — Z79899 Other long term (current) drug therapy: Secondary | ICD-10-CM | POA: Insufficient documentation

## 2015-09-08 DIAGNOSIS — E063 Autoimmune thyroiditis: Secondary | ICD-10-CM | POA: Insufficient documentation

## 2015-09-08 DIAGNOSIS — C49A0 Gastrointestinal stromal tumor, unspecified site: Secondary | ICD-10-CM | POA: Diagnosis not present

## 2015-09-08 DIAGNOSIS — I10 Essential (primary) hypertension: Secondary | ICD-10-CM | POA: Diagnosis not present

## 2015-09-08 DIAGNOSIS — E7800 Pure hypercholesterolemia, unspecified: Secondary | ICD-10-CM | POA: Insufficient documentation

## 2015-09-08 DIAGNOSIS — C73 Malignant neoplasm of thyroid gland: Secondary | ICD-10-CM | POA: Diagnosis not present

## 2015-09-08 DIAGNOSIS — E0590 Thyrotoxicosis, unspecified without thyrotoxic crisis or storm: Secondary | ICD-10-CM | POA: Diagnosis present

## 2015-09-08 HISTORY — PX: THYROIDECTOMY: SHX17

## 2015-09-08 SURGERY — THYROIDECTOMY
Anesthesia: General | Site: Neck

## 2015-09-08 MED ORDER — FENTANYL CITRATE (PF) 250 MCG/5ML IJ SOLN
INTRAMUSCULAR | Status: AC
  Filled 2015-09-08: qty 5

## 2015-09-08 MED ORDER — DEXAMETHASONE SODIUM PHOSPHATE 10 MG/ML IJ SOLN
INTRAMUSCULAR | Status: DC | PRN
  Administered 2015-09-08: 10 mg via INTRAVENOUS

## 2015-09-08 MED ORDER — HYDROMORPHONE HCL 1 MG/ML IJ SOLN
INTRAMUSCULAR | Status: DC | PRN
  Administered 2015-09-08 (×2): 1 mg via INTRAVENOUS

## 2015-09-08 MED ORDER — ROCURONIUM BROMIDE 100 MG/10ML IV SOLN
INTRAVENOUS | Status: DC | PRN
  Administered 2015-09-08: 50 mg via INTRAVENOUS
  Administered 2015-09-08: 10 mg via INTRAVENOUS

## 2015-09-08 MED ORDER — SUGAMMADEX SODIUM 200 MG/2ML IV SOLN
INTRAVENOUS | Status: AC
  Filled 2015-09-08: qty 2

## 2015-09-08 MED ORDER — ONDANSETRON HCL 4 MG/2ML IJ SOLN
INTRAMUSCULAR | Status: AC
  Filled 2015-09-08: qty 2

## 2015-09-08 MED ORDER — EPHEDRINE SULFATE 50 MG/ML IJ SOLN
INTRAMUSCULAR | Status: AC
  Filled 2015-09-08: qty 1

## 2015-09-08 MED ORDER — ONDANSETRON HCL 4 MG/2ML IJ SOLN: 4.0000 mg | Freq: Four times a day (QID) | INTRAMUSCULAR | Status: DC | PRN

## 2015-09-08 MED ORDER — ROCURONIUM BROMIDE 100 MG/10ML IV SOLN
INTRAVENOUS | Status: AC
  Filled 2015-09-08: qty 1

## 2015-09-08 MED ORDER — SUCCINYLCHOLINE CHLORIDE 20 MG/ML IJ SOLN
INTRAMUSCULAR | Status: DC | PRN
  Administered 2015-09-08: 100 mg via INTRAVENOUS

## 2015-09-08 MED ORDER — KCL IN DEXTROSE-NACL 20-5-0.45 MEQ/L-%-% IV SOLN
INTRAVENOUS | Status: DC
  Administered 2015-09-08: 13:00:00 via INTRAVENOUS
  Filled 2015-09-08 (×2): qty 1000

## 2015-09-08 MED ORDER — HYDROCODONE-ACETAMINOPHEN 5-325 MG PO TABS
1.0000 | ORAL_TABLET | ORAL | Status: DC | PRN
  Administered 2015-09-08 – 2015-09-09 (×2): 1 via ORAL
  Filled 2015-09-08 (×2): qty 1

## 2015-09-08 MED ORDER — PROPOFOL 10 MG/ML IV BOLUS
INTRAVENOUS | Status: AC
  Filled 2015-09-08: qty 20

## 2015-09-08 MED ORDER — METOPROLOL SUCCINATE 12.5 MG HALF TABLET
12.5000 mg | ORAL_TABLET | Freq: Every day | ORAL | Status: DC
  Administered 2015-09-09: 12.5 mg via ORAL
  Filled 2015-09-08: qty 1

## 2015-09-08 MED ORDER — HYDROMORPHONE HCL 1 MG/ML IJ SOLN
INTRAMUSCULAR | Status: AC
  Filled 2015-09-08: qty 1

## 2015-09-08 MED ORDER — BENAZEPRIL-HYDROCHLOROTHIAZIDE 20-12.5 MG PO TABS: 1.0000 | ORAL_TABLET | Freq: Every day | ORAL | Status: DC

## 2015-09-08 MED ORDER — ACETAMINOPHEN 650 MG RE SUPP: 650.0000 mg | Freq: Four times a day (QID) | RECTAL | Status: DC | PRN

## 2015-09-08 MED ORDER — HYDROMORPHONE HCL 1 MG/ML IJ SOLN
0.2500 mg | INTRAMUSCULAR | Status: DC | PRN
  Administered 2015-09-08 (×4): 0.25 mg via INTRAVENOUS
  Administered 2015-09-08: 0.5 mg via INTRAVENOUS
  Administered 2015-09-08: 0.25 mg via INTRAVENOUS

## 2015-09-08 MED ORDER — BENAZEPRIL HCL 20 MG PO TABS
20.0000 mg | ORAL_TABLET | Freq: Every day | ORAL | Status: DC
  Administered 2015-09-08 – 2015-09-09 (×2): 20 mg via ORAL
  Filled 2015-09-08 (×2): qty 1

## 2015-09-08 MED ORDER — MIDAZOLAM HCL 2 MG/2ML IJ SOLN
INTRAMUSCULAR | Status: AC
  Filled 2015-09-08: qty 2

## 2015-09-08 MED ORDER — HYDROMORPHONE HCL 2 MG/ML IJ SOLN
INTRAMUSCULAR | Status: AC
  Filled 2015-09-08: qty 1

## 2015-09-08 MED ORDER — SUGAMMADEX SODIUM 200 MG/2ML IV SOLN
INTRAVENOUS | Status: DC | PRN
  Administered 2015-09-08: 200 mg via INTRAVENOUS

## 2015-09-08 MED ORDER — PROPOFOL 10 MG/ML IV BOLUS
INTRAVENOUS | Status: DC | PRN
  Administered 2015-09-08: 200 mg via INTRAVENOUS

## 2015-09-08 MED ORDER — ONDANSETRON HCL 4 MG/2ML IJ SOLN
INTRAMUSCULAR | Status: DC | PRN
  Administered 2015-09-08: 4 mg via INTRAVENOUS

## 2015-09-08 MED ORDER — ESMOLOL HCL 100 MG/10ML IV SOLN
INTRAVENOUS | Status: AC
  Filled 2015-09-08: qty 10

## 2015-09-08 MED ORDER — HYDROMORPHONE HCL 1 MG/ML IJ SOLN: 2.0000 mg | INTRAMUSCULAR | Status: DC | PRN

## 2015-09-08 MED ORDER — SODIUM CHLORIDE 0.9 % IJ SOLN
INTRAMUSCULAR | Status: AC
Start: 2015-09-08 — End: 2015-09-08
  Filled 2015-09-08: qty 10

## 2015-09-08 MED ORDER — LIDOCAINE HCL (CARDIAC) 20 MG/ML IV SOLN
INTRAVENOUS | Status: DC | PRN
  Administered 2015-09-08: 100 mg via INTRAVENOUS

## 2015-09-08 MED ORDER — ACETAMINOPHEN 325 MG PO TABS: 650.0000 mg | ORAL_TABLET | Freq: Four times a day (QID) | ORAL | Status: DC | PRN

## 2015-09-08 MED ORDER — FENTANYL CITRATE (PF) 100 MCG/2ML IJ SOLN
INTRAMUSCULAR | Status: DC | PRN
  Administered 2015-09-08 (×5): 50 ug via INTRAVENOUS

## 2015-09-08 MED ORDER — HYDROMORPHONE HCL 1 MG/ML IJ SOLN
1.0000 mg | INTRAMUSCULAR | Status: DC | PRN
Start: 2015-09-08 — End: 2015-09-09
  Administered 2015-09-08 (×2): 1 mg via INTRAVENOUS
  Filled 2015-09-08 (×2): qty 1

## 2015-09-08 MED ORDER — ESMOLOL HCL 100 MG/10ML IV SOLN
INTRAVENOUS | Status: DC | PRN
  Administered 2015-09-08: 20 mg via INTRAVENOUS

## 2015-09-08 MED ORDER — MIDAZOLAM HCL 5 MG/5ML IJ SOLN
INTRAMUSCULAR | Status: DC | PRN
  Administered 2015-09-08: 2 mg via INTRAVENOUS

## 2015-09-08 MED ORDER — DEXAMETHASONE SODIUM PHOSPHATE 10 MG/ML IJ SOLN
INTRAMUSCULAR | Status: AC
  Filled 2015-09-08: qty 1

## 2015-09-08 MED ORDER — LIDOCAINE HCL (CARDIAC) 20 MG/ML IV SOLN
INTRAVENOUS | Status: AC
  Filled 2015-09-08: qty 5

## 2015-09-08 MED ORDER — HYDROCHLOROTHIAZIDE 12.5 MG PO CAPS
12.5000 mg | ORAL_CAPSULE | Freq: Every day | ORAL | Status: DC
  Administered 2015-09-08 – 2015-09-09 (×2): 12.5 mg via ORAL
  Filled 2015-09-08 (×2): qty 1

## 2015-09-08 MED ORDER — CETYLPYRIDINIUM CHLORIDE 0.05 % MT LIQD
7.0000 mL | Freq: Two times a day (BID) | OROMUCOSAL | Status: DC
  Administered 2015-09-08 – 2015-09-09 (×3): 7 mL via OROMUCOSAL

## 2015-09-08 MED ORDER — ONDANSETRON 4 MG PO TBDP: 4.0000 mg | ORAL_TABLET | Freq: Four times a day (QID) | ORAL | Status: DC | PRN

## 2015-09-08 MED ORDER — LEVOTHYROXINE SODIUM 100 MCG PO TABS
100.0000 ug | ORAL_TABLET | Freq: Every day | ORAL | Status: DC
  Administered 2015-09-09: 100 ug via ORAL
  Filled 2015-09-08: qty 1

## 2015-09-08 MED ORDER — LACTATED RINGERS IV SOLN
INTRAVENOUS | Status: DC | PRN
  Administered 2015-09-08 (×2): via INTRAVENOUS

## 2015-09-08 MED ORDER — OXYCODONE HCL 5 MG PO TABS: 5.0000 mg | ORAL_TABLET | ORAL | Status: DC | PRN

## 2015-09-08 MED ORDER — ATROPINE SULFATE 0.4 MG/ML IJ SOLN
INTRAMUSCULAR | Status: AC
  Filled 2015-09-08: qty 1

## 2015-09-08 MED ORDER — CALCIUM CARBONATE 1250 (500 CA) MG PO TABS
2.0000 | ORAL_TABLET | Freq: Three times a day (TID) | ORAL | Status: DC
  Administered 2015-09-08 – 2015-09-09 (×3): 1000 mg via ORAL
  Filled 2015-09-08 (×7): qty 2

## 2015-09-08 SURGICAL SUPPLY — 37 items
ATTRACTOMAT 16X20 MAGNETIC DRP (DRAPES) ×2 IMPLANT
BENZOIN TINCTURE PRP APPL 2/3 (GAUZE/BANDAGES/DRESSINGS) ×2 IMPLANT
BLADE SURG 15 STRL LF DISP TIS (BLADE) ×1 IMPLANT
BLADE SURG 15 STRL SS (BLADE) ×1
CHLORAPREP W/TINT 26ML (MISCELLANEOUS) ×2 IMPLANT
CLIP TI MEDIUM 6 (CLIP) ×6 IMPLANT
CLIP TI WIDE RED SMALL 6 (CLIP) ×6 IMPLANT
COVER SURGICAL LIGHT HANDLE (MISCELLANEOUS) ×2 IMPLANT
DISSECTOR ROUND CHERRY 3/8 STR (MISCELLANEOUS) IMPLANT
DRAPE LAPAROTOMY T 98X78 PEDS (DRAPES) ×2 IMPLANT
DRESSING SURGICEL FIBRLLR 1X2 (HEMOSTASIS) ×2 IMPLANT
DRSG SURGICEL FIBRILLAR 1X2 (HEMOSTASIS) ×4
ELECT PENCIL ROCKER SW 15FT (MISCELLANEOUS) ×2 IMPLANT
ELECT REM PT RETURN 9FT ADLT (ELECTROSURGICAL) ×2
ELECTRODE REM PT RTRN 9FT ADLT (ELECTROSURGICAL) ×1 IMPLANT
GAUZE SPONGE 4X4 12PLY STRL (GAUZE/BANDAGES/DRESSINGS) ×2 IMPLANT
GAUZE SPONGE 4X4 16PLY XRAY LF (GAUZE/BANDAGES/DRESSINGS) ×4 IMPLANT
GLOVE SURG ORTHO 8.0 STRL STRW (GLOVE) ×2 IMPLANT
GOWN STRL REUS W/TWL XL LVL3 (GOWN DISPOSABLE) ×4 IMPLANT
ILLUMINATOR WAVEGUIDE N/F (MISCELLANEOUS) IMPLANT
KIT BASIN OR (CUSTOM PROCEDURE TRAY) ×2 IMPLANT
LIGHT WAVEGUIDE WIDE FLAT (MISCELLANEOUS) ×2 IMPLANT
PACK BASIC VI WITH GOWN DISP (CUSTOM PROCEDURE TRAY) ×2 IMPLANT
SHEARS HARMONIC 9CM CVD (BLADE) ×2 IMPLANT
STAPLER VISISTAT 35W (STAPLE) IMPLANT
STRIP CLOSURE SKIN 1/2X4 (GAUZE/BANDAGES/DRESSINGS) ×2 IMPLANT
SUT MNCRL AB 4-0 PS2 18 (SUTURE) ×2 IMPLANT
SUT SILK 2 0 (SUTURE)
SUT SILK 2-0 18XBRD TIE 12 (SUTURE) IMPLANT
SUT SILK 3 0 (SUTURE)
SUT SILK 3-0 18XBRD TIE 12 (SUTURE) IMPLANT
SUT VIC AB 3-0 SH 18 (SUTURE) ×4 IMPLANT
SYR BULB IRRIGATION 50ML (SYRINGE) ×2 IMPLANT
TAPE CLOTH SURG 4X10 WHT LF (GAUZE/BANDAGES/DRESSINGS) ×2 IMPLANT
TOWEL OR 17X26 10 PK STRL BLUE (TOWEL DISPOSABLE) ×2 IMPLANT
TOWEL OR NON WOVEN STRL DISP B (DISPOSABLE) ×2 IMPLANT
YANKAUER SUCT BULB TIP 10FT TU (MISCELLANEOUS) ×2 IMPLANT

## 2015-09-08 NOTE — Interval H&P Note (Signed)
History and Physical Interval Note:  09/08/2015 7:20 AM  Lauren [name]  has presented today for surgery, with the diagnosis of multinodular goiter with hypethyroidism.  The various methods of treatment have been discussed with the patient and family. After consideration of risks, benefits and other options for treatment, the patient has consented to    Procedure(s): TOTAL THYROIDECTOMY (N/A) as a surgical intervention .    The patient's history has been reviewed, patient examined, no change in status, stable for surgery.  I have reviewed the patient's chart and labs.  Questions were answered to the patient's satisfaction.    Earnstine Regal, MD, [facility] Office: [place]

## 2015-09-08 NOTE — Transfer of Care (Signed)
Immediate Anesthesia Transfer of Care Note  Patient: Lauren [name]  Procedure(s) Performed: Procedure(s): TOTAL THYROIDECTOMY (N/A)  Patient Location: PACU  Anesthesia Type:General  Level of Consciousness:  sedated, patient cooperative and responds to stimulation  Airway & Oxygen Therapy:Patient Spontanous Breathing and Patient connected to face mask oxgen  Post-op Assessment:  Report given to PACU RN and Post -op Vital signs reviewed and stable  Post vital signs:  Reviewed and stable  Last Vitals:  Vitals:   09/08/15 0542 09/08/15 0931  BP: 138/86 (!) 148/94  Pulse: 88   Resp: 18   Temp: 123XX123 C     Complications: No apparent anesthesia complications

## 2015-09-08 NOTE — Anesthesia Postprocedure Evaluation (Signed)
Anesthesia Post Note  Patient: Lauren [name]  Procedure(s) Performed: Procedure(s) (LRB): TOTAL THYROIDECTOMY (N/A)  Patient location during evaluation: PACU Anesthesia Type: General Level of consciousness: awake, awake and alert and oriented Pain management: pain level controlled Respiratory status: nonlabored ventilation and respiratory function stable Cardiovascular status: blood pressure returned to baseline Postop Assessment: patient able to bend at knees Anesthetic complications: no    Last Vitals:  Vitals:   09/08/15 1300 09/08/15 1400  BP: (!) 150/80 (!) 112/45  Pulse: 64 73  Resp: 18 17  Temp: 36.8 C 36.5 C    Last Pain:  Vitals:   09/08/15 1451  TempSrc:   PainSc: 0-No pain                 Kasean Denherder COKER

## 2015-09-08 NOTE — Anesthesia Preprocedure Evaluation (Addendum)
Anesthesia Evaluation  Patient identified by MRN, date of birth, ID band Patient awake    Reviewed: Allergy & Precautions, NPO status , Patient's Chart, lab work & pertinent test results  Airway Mallampati: II  TM Distance: >3 FB Neck ROM: Full    Dental  (+) Teeth Intact, Dental Advisory Given   Pulmonary    breath sounds clear to auscultation       Cardiovascular  Rhythm:Regular Rate:Normal     Neuro/Psych    GI/Hepatic   Endo/Other    Renal/GU      Musculoskeletal   Abdominal   Peds  Hematology   Anesthesia Other Findings   Reproductive/Obstetrics                             Anesthesia Physical Anesthesia Plan  ASA: III  Anesthesia Plan: General   Post-op Pain Management:    Induction: Intravenous  Airway Management Planned: Oral ETT  Additional Equipment:   Intra-op Plan:   Post-operative Plan: Extubation in OR  Informed Consent: I have reviewed the patients History and Physical, chart, labs and discussed the procedure including the risks, benefits and alternatives for the proposed anesthesia with the patient or authorized representative who has indicated his/her understanding and acceptance.   Dental advisory given  Plan Discussed with: CRNA and Anesthesiologist  Anesthesia Plan Comments:        Anesthesia Quick Evaluation  

## 2015-09-08 NOTE — Anesthesia Procedure Notes (Signed)
Procedure Name: Intubation Date/Time: 09/08/2015 7:39 AM Performed by: Montel Clock Pre-anesthesia Checklist: Patient identified, Emergency Drugs available, Suction available, Patient being monitored and Timeout performed Patient Re-evaluated:Patient Re-evaluated prior to inductionOxygen Delivery Method: Circle system utilized Preoxygenation: Pre-oxygenation with 100% oxygen Intubation Type: IV induction Ventilation: Mask ventilation without difficulty Laryngoscope Size: Mac and 3 Grade View: Grade I Tube type: Oral Tube size: 7.5 mm Number of attempts: 1 Airway Equipment and Method: Stylet Placement Confirmation: ETT inserted through vocal cords under direct vision,  positive ETCO2 and breath sounds checked- equal and bilateral Secured at: 21 cm Tube secured with: Tape Dental Injury: Teeth and Oropharynx as per pre-operative assessment

## 2015-09-08 NOTE — Op Note (Signed)
Procedure Note  Pre-operative Diagnosis:  Multinodular goiter, hyperthyroidism   Post-operative Diagnosis:  same  Surgeon:  Earnstine Regal, MD, FACS  Assistant:  none   Procedure:  Total thyroidectomy  Anesthesia:  General  Estimated Blood Loss:  minimal  Drains: none         Specimen: thyroid to pathology  Indications:  The patient is a 60 year old female who presents with hyperthyroidism.  Patient is referred by Dr. Jacelyn Pi for evaluation of multinodular goiter with hyperthyroidism. Patient's primary care physician is Dr. Maurice Small. Patient was diagnosed with multiple thyroid nodules in 2013. She has been followed with sequential ultrasound scanning showing a stable multinodular goiter. Recent ultrasound in March 2017 showed an enlarged gland with the right lobe measuring 7.8 cm and left lobe measuring 7.3 cm. There were bilateral thyroid nodules which were stable and without worrisome features. Patient has not undergone previous biopsy. However her thyroid function test showed a suppressed TSH in April 2017 of 0.030. Patient is on beta blockers for hypertension and has had minimal symptoms of hyperthyroidism. Patient is now referred for consideration for thyroidectomy for management of multinodular goiter with hyperthyroidism. Patient denies any significant compressive symptoms.   Procedure Details: Procedure was done in OR #2 at the [facility].  The patient was brought to the operating room and placed in a supine position on the operating room table.  Following administration of general anesthesia, the patient was positioned and then prepped and draped in the usual aseptic fashion.  After ascertaining that an adequate level of anesthesia had been achieved, a Kocher incision was made with #15 blade.  Dissection was carried through subcutaneous tissues and platysma. Hemostasis was achieved with the electrocautery.  Skin flaps were elevated cephalad and caudad from  the thyroid notch to the sternal notch.  The [place] self-retaining retractor was placed for exposure.  Strap muscles were incised in the midline and dissection was begun on the left side.  Strap muscles were reflected laterally.  Left thyroid lobe was markedly enlarged, soft, and multinodular.  The left lobe was gently mobilized with blunt dissection.  Superior pole vessels were dissected out and divided individually between small and medium Ligaclips with the Harmonic scalpel.  The thyroid lobe was rolled anteriorly.  Branches of the inferior thyroid artery were divided between small Ligaclips with the Harmonic scalpel.  Inferior venous tributaries were divided between Ligaclips.  Both the superior and inferior parathyroid glands were identified and preserved on their vascular pedicles.  The recurrent laryngeal nerve was identified and preserved along its course.  The ligament of Gwenlyn Found was released with the electrocautery and the gland was mobilized onto the anterior trachea. Isthmus was mobilized across the midline.  There was a large pyramidal lobe present which was resected en bloc with the isthmus.  Dry pack was placed in the left neck.  Next, the right thyroid lobe was gently mobilized with blunt dissection.  Right thyroid lobe was moderately enlarged, soft, and multinodular.  Superior pole vessels were dissected out and divided between small and medium Ligaclips with the Harmonic scalpel.  Superior parathyroid was identified and preserved.  Inferior venous tributaries were divided between medium Ligaclips with the Harmonic scalpel.  The right thyroid lobe was rolled anteriorly and the branches of the inferior thyroid artery divided between small Ligaclips.  The right recurrent laryngeal nerve was identified and preserved along its course.  The ligament of Gwenlyn Found was released with the electrocautery.  The right thyroid  lobe was mobilized onto the anterior trachea and the remainder of the thyroid was  dissected off the anterior trachea and the thyroid was completely excised.  A suture was used to mark the left lobe. The entire thyroid gland was submitted to pathology for review.  The neck was irrigated with warm saline.  Fibrillar was placed throughout the operative field.  Strap muscles were reapproximated in the midline with interrupted 3-0 Vicryl sutures.  Platysma was closed with interrupted 3-0 Vicryl sutures.  Skin was closed with a running 4-0 Monocryl subcuticular suture.  Wound was washed and dried and benzoin and steri-strips were applied.  Dry gauze dressing was placed.  The patient was awakened from anesthesia and brought to the recovery room.  The patient tolerated the procedure well.   Earnstine Regal, MD, [facility] Office: [phone]

## 2015-09-09 DIAGNOSIS — E063 Autoimmune thyroiditis: Secondary | ICD-10-CM | POA: Diagnosis not present

## 2015-09-09 DIAGNOSIS — Z79899 Other long term (current) drug therapy: Secondary | ICD-10-CM | POA: Diagnosis not present

## 2015-09-09 DIAGNOSIS — I10 Essential (primary) hypertension: Secondary | ICD-10-CM | POA: Diagnosis not present

## 2015-09-09 DIAGNOSIS — Z7982 Long term (current) use of aspirin: Secondary | ICD-10-CM | POA: Diagnosis not present

## 2015-09-09 DIAGNOSIS — E7800 Pure hypercholesterolemia, unspecified: Secondary | ICD-10-CM | POA: Diagnosis not present

## 2015-09-09 DIAGNOSIS — C73 Malignant neoplasm of thyroid gland: Secondary | ICD-10-CM | POA: Diagnosis not present

## 2015-09-09 LAB — BASIC METABOLIC PANEL
Anion gap: 4 — ABNORMAL LOW (ref 5–15)
BUN: 10 mg/dL (ref 6–20)
CHLORIDE: 108 mmol/L (ref 101–111)
CO2: 29 mmol/L (ref 22–32)
CREATININE: 0.75 mg/dL (ref 0.44–1.00)
Calcium: 8.9 mg/dL (ref 8.9–10.3)
GFR calc non Af Amer: >60 mL/min (ref >60)
Glucose, Bld: 119 mg/dL — ABNORMAL HIGH (ref 65–99)
Potassium: 3.8 mmol/L (ref 3.5–5.1)
Sodium: 141 mmol/L (ref 135–145)

## 2015-09-09 MED ORDER — HYDROCODONE-ACETAMINOPHEN 5-325 MG PO TABS: 1.0000 | ORAL_TABLET | ORAL | 0 refills | Status: DC | PRN

## 2015-09-09 MED ORDER — LEVOTHYROXINE SODIUM 100 MCG PO TABS: 100.0000 ug | ORAL_TABLET | Freq: Every day | ORAL | 3 refills | Status: DC

## 2015-09-09 NOTE — Discharge Summary (Signed)
Physician Discharge Summary [facility]  Patient ID: Lauren [name] MRN: [id number] DOB/AGE: [birth year] 60 y.o.  Admit date: 09/08/2015 Discharge date: 09/09/2015  Admission Diagnoses:  Multinodular goiter, hyperthyroidism  Discharge Diagnoses:  Principal Problem:   Hyperthyroidism Active Problems:   Multinodular goiter   Toxic multinodular goiter   Discharged Condition: good  Hospital Course: Patient was admitted for observation following thyroid surgery.  Post op course was uncomplicated.  Pain was well controlled.  Tolerated diet.  Post op calcium level on morning following surgery was 8.9 mg/dl.  Patient was prepared for discharge home on POD#1.  Consults: None  Treatments: surgery: total thyroidectomy  Discharge Exam: Blood pressure (!) 121/54, pulse 70, temperature 97.7 F (36.5 C), temperature source Oral, resp. rate 18, height 5\' 6"  (1.676 m), weight 105.2 kg (232 lb), SpO2 100 %. HEENT - clear Neck - wound dry and intact; minimal STS; voice slightly hoarse Chest - clear bilaterally Cor - RRR   Disposition: Home  Discharge Instructions    Diet - low sodium heart healthy    Complete by:  As directed   Discharge instructions    Complete by:  As directed   [facility]  THYROID & PARATHYROID SURGERY:  POST-OP INSTRUCTIONS  Always review your discharge instruction sheet from the facility where your surgery was performed.  A prescription for pain medication may be given to you upon discharge.  Take your pain medication as prescribed.  If narcotic pain medicine is not needed, then you may take acetaminophen (Tylenol) or ibuprofen (Advil) as needed.  Take your usually prescribed medications unless otherwise directed.  If you need a refill on your pain medication, please contact your pharmacy. They will contact our office to request authorization.  Prescriptions will not be processed by our office after 5 pm or on  weekends.  Start with a light diet upon arrival home, such as soup and crackers or toast.  Be sure to drink plenty of fluids daily.  Resume your normal diet the day after surgery.  Most patients will experience some swelling and bruising on the chest and neck area.  Ice packs will help.  Swelling and bruising can take several days to resolve.   It is common to experience some constipation after surgery.  Increasing fluid intake and taking a stool softener will usually help or prevent this problem.  A mild laxative (Milk of Magnesia or Miralax) should be taken according to package directions if there has been no bowel movement after 48 hours.  You have steri-strips and a gauze dressing over your incision.  You may remove the gauze bandage on the second day after surgery, and you may shower at that time.  Leave your steri-strips (small skin tapes) in place directly over the incision.  These strips should remain on the skin for 5-7 days and then be removed.  You may get them wet in the shower and pat them dry.  You may resume regular (light) daily activities beginning the next day - such as daily self-care, walking, climbing stairs - gradually increasing activities as tolerated.  You may have sexual intercourse when it is comfortable.  Refrain from any heavy lifting or straining until approved by your doctor.  You may drive when you no longer are taking prescription pain medication, you can comfortably wear a seatbelt, and you can safely maneuver your car and apply brakes.  You should see your doctor in the office for a follow-up appointment approximately two  to three weeks after your surgery.  Make sure that you call for this appointment within a day or two after you arrive home to insure a convenient appointment time.  WHEN TO CALL YOUR DOCTOR: -- Fever greater than 101.5 -- Inability to urinate -- Nausea and/or vomiting - persistent -- Extreme swelling or bruising -- Continued bleeding from  incision -- Increased pain, redness, or drainage from the incision -- Difficulty swallowing or breathing -- Muscle cramping or spasms -- Numbness or tingling in hands or around lips  The clinic staff is available to answer your questions during regular business hours.  Please don't hesitate to call and ask to speak to one of the nurses if you have concerns.  Earnstine Regal, MD, [facility] Office: [phone]  Website: [email or web address]   Increase activity slowly    Complete by:  As directed   Remove dressing in 24 hours    Complete by:  As directed       Medication List    TAKE these medications   aspirin 81 MG tablet Take 81 mg by mouth daily.   benazepril-hydrochlorthiazide 20-12.5 MG tablet Commonly known as:  LOTENSIN HCT Take 1 tablet by mouth daily.   docusate sodium 100 MG capsule Commonly known as:  COLACE Take 100 mg by mouth daily.   HYDROcodone-acetaminophen 5-325 MG tablet Commonly known as:  NORCO/VICODIN Take 1-2 tablets by mouth every 4 (four) hours as needed for moderate pain.   levothyroxine 100 MCG tablet Commonly known as:  SYNTHROID, LEVOTHROID Take 1 tablet (100 mcg total) by mouth daily before breakfast.   metoprolol succinate 25 MG 24 hr tablet Commonly known as:  TOPROL-XL Take 12.5 mg by mouth daily.   multivitamin-iron-minerals-folic acid chewable tablet Chew 1 tablet by mouth daily.   simvastatin 20 MG tablet Commonly known as:  ZOCOR Take 20 mg by mouth every evening.        Earnstine Regal, MD, [facility] Office: [phone]   Signed: Earnstine Regal 09/09/2015, 7:31 AM

## 2015-09-09 NOTE — Progress Notes (Signed)
Patient to d/c home, self care.  Discharge instructions given to patient, vocalizes understanding, aware of when to call MD and what conditions are emergent and require 911 services.  Will continue to monitor.

## 2015-09-09 NOTE — Discharge Instructions (Addendum)
Hyperthyroidism Hyperthyroidism is when the thyroid is too active (overactive). Your thyroid is a large gland that is located in your neck. The thyroid helps to control how your body uses food (metabolism). When your thyroid is overactive, it produces too much of a hormone called thyroxine.  CAUSES Causes of hyperthyroidism may include:  Graves disease. This is when your immune system attacks the thyroid gland. This is the most common cause.  Inflammation of the thyroid gland.  Tumor in the thyroid gland or somewhere else.  Excessive use of thyroid medicines, including:  Prescription thyroid supplement.  Herbal supplements that mimic thyroid hormones.  Solid or fluid-filled lumps within your thyroid gland (thyroid nodules).  Excessive ingestion of iodine. RISK FACTORS  Being female.  Having a family history of thyroid conditions. SIGNS AND SYMPTOMS Signs and symptoms of hyperthyroidism may include:  Nervousness.  Inability to tolerate heat.  Unexplained weight loss.  Diarrhea.  Change in the texture of hair or skin.  Heart skipping beats or making extra beats.  Rapid heart rate.  Loss of menstruation.  Shaky hands.  Fatigue.  Restlessness.  Increased appetite.  Sleep problems.  Enlarged thyroid gland or nodules. DIAGNOSIS  Diagnosis of hyperthyroidism may include:  Medical history and physical exam.  Blood tests.  Ultrasound tests. TREATMENT Treatment may include:  Medicines to control your thyroid.  Surgery to remove your thyroid.  Radiation therapy. HOME CARE INSTRUCTIONS   Take medicines only as directed by your health care provider.  Do not use any tobacco products, including cigarettes, chewing tobacco, or electronic cigarettes. If you need help quitting, ask your health care provider.  Do not exercise or do physical activity until your health care provider approves.  Keep all follow-up appointments as directed by your health  care provider. This is important. SEEK MEDICAL CARE IF:  Your symptoms do not get better with treatment.  You have fever.  You are taking thyroid replacement medicine and you:  Have depression.  Feel mentally and physically slow.  Have weight gain. SEEK IMMEDIATE MEDICAL CARE IF:   You have decreased alertness or a change in your awareness.  You have abdominal pain.  You feel dizzy.  You have a rapid heartbeat.  You have an irregular heartbeat.   This information is not intended to replace advice given to you by your health care provider. Make sure you discuss any questions you have with your health care provider.   Document Released: 01/31/2005 Document Revised: 02/21/2014 Document Reviewed: 06/18/2013 Elsevier Interactive Patient Education 2016 [employer].   Hyperthyroidism Hyperthyroidism is when the thyroid is too active (overactive). Your thyroid is a large gland that is located in your neck. The thyroid helps to control how your body uses food (metabolism). When your thyroid is overactive, it produces too much of a hormone called thyroxine.  CAUSES Causes of hyperthyroidism may include:  Graves disease. This is when your immune system attacks the thyroid gland. This is the most common cause.  Inflammation of the thyroid gland.  Tumor in the thyroid gland or somewhere else.  Excessive use of thyroid medicines, including:  Prescription thyroid supplement.  Herbal supplements that mimic thyroid hormones.  Solid or fluid-filled lumps within your thyroid gland (thyroid nodules).  Excessive ingestion of iodine. RISK FACTORS  Being female.  Having a family history of thyroid conditions. SIGNS AND SYMPTOMS Signs and symptoms of hyperthyroidism may include:  Nervousness.  Inability to tolerate heat.  Unexplained weight loss.  Diarrhea.  Change in the  texture of hair or skin.  Heart skipping beats or making extra beats.  Rapid heart rate.  Loss  of menstruation.  Shaky hands.  Fatigue.  Restlessness.  Increased appetite.  Sleep problems.  Enlarged thyroid gland or nodules. DIAGNOSIS  Diagnosis of hyperthyroidism may include:  Medical history and physical exam.  Blood tests.  Ultrasound tests. TREATMENT Treatment may include:  Medicines to control your thyroid.  Surgery to remove your thyroid.  Radiation therapy. HOME CARE INSTRUCTIONS   Take medicines only as directed by your health care provider.  Do not use any tobacco products, including cigarettes, chewing tobacco, or electronic cigarettes. If you need help quitting, ask your health care provider.  Do not exercise or do physical activity until your health care provider approves.  Keep all follow-up appointments as directed by your health care provider. This is important. SEEK MEDICAL CARE IF:  Your symptoms do not get better with treatment.  You have fever.  You are taking thyroid replacement medicine and you:  Have depression.  Feel mentally and physically slow.  Have weight gain. SEEK IMMEDIATE MEDICAL CARE IF:   You have decreased alertness or a change in your awareness.  You have abdominal pain.  You feel dizzy.  You have a rapid heartbeat.  You have an irregular heartbeat.   This information is not intended to replace advice given to you by your health care provider. Make sure you discuss any questions you have with your health care provider.   Document Released: 01/31/2005 Document Revised: 02/21/2014 Document Reviewed: 06/18/2013 Elsevier Interactive Patient Education 2016 [facility]  Take two TUMS twice a day for next two weeks.  Do NOT take with thyroid medication.  Earnstine Regal, MD, [facility] Office: [phone]

## 2015-09-18 ENCOUNTER — Ambulatory Visit
Admission: RE | Admit: 2015-09-18 | Discharge: 2015-09-18 | Disposition: A | Discharge status: Home / Self Care | Payer: BLUE CROSS/BLUE SHIELD | Source: Ambulatory Visit | Attending: Family Medicine | Admitting: Family Medicine

## 2015-09-18 ENCOUNTER — Other Ambulatory Visit: Payer: Self-pay | Admitting: Family Medicine

## 2015-09-18 DIAGNOSIS — M25571 Pain in right ankle and joints of right foot: Secondary | ICD-10-CM

## 2015-09-18 DIAGNOSIS — E890 Postprocedural hypothyroidism: Secondary | ICD-10-CM | POA: Diagnosis not present

## 2015-09-18 DIAGNOSIS — M7989 Other specified soft tissue disorders: Secondary | ICD-10-CM | POA: Diagnosis not present

## 2015-10-05 ENCOUNTER — Emergency Department (HOSPITAL_COMMUNITY)
Admission: EM | Admit: 2015-10-05 | Discharge: 2015-10-06 | Disposition: A | Discharge status: Home / Self Care | Payer: BLUE CROSS/BLUE SHIELD | Attending: Emergency Medicine | Admitting: Emergency Medicine

## 2015-10-05 DIAGNOSIS — R1110 Vomiting, unspecified: Secondary | ICD-10-CM

## 2015-10-05 DIAGNOSIS — E042 Nontoxic multinodular goiter: Secondary | ICD-10-CM | POA: Diagnosis not present

## 2015-10-05 DIAGNOSIS — R918 Other nonspecific abnormal finding of lung field: Secondary | ICD-10-CM | POA: Diagnosis not present

## 2015-10-05 DIAGNOSIS — R05 Cough: Secondary | ICD-10-CM | POA: Diagnosis not present

## 2015-10-05 DIAGNOSIS — M79602 Pain in left arm: Secondary | ICD-10-CM | POA: Diagnosis not present

## 2015-10-05 DIAGNOSIS — I10 Essential (primary) hypertension: Secondary | ICD-10-CM | POA: Diagnosis not present

## 2015-10-05 DIAGNOSIS — K311 Adult hypertrophic pyloric stenosis: Secondary | ICD-10-CM

## 2015-10-05 DIAGNOSIS — Z79899 Other long term (current) drug therapy: Secondary | ICD-10-CM | POA: Insufficient documentation

## 2015-10-05 DIAGNOSIS — Z7982 Long term (current) use of aspirin: Secondary | ICD-10-CM | POA: Diagnosis not present

## 2015-10-05 DIAGNOSIS — K219 Gastro-esophageal reflux disease without esophagitis: Secondary | ICD-10-CM | POA: Diagnosis present

## 2015-10-05 DIAGNOSIS — R079 Chest pain, unspecified: Secondary | ICD-10-CM | POA: Insufficient documentation

## 2015-10-05 DIAGNOSIS — K5730 Diverticulosis of large intestine without perforation or abscess without bleeding: Secondary | ICD-10-CM | POA: Diagnosis not present

## 2015-10-05 DIAGNOSIS — K449 Diaphragmatic hernia without obstruction or gangrene: Secondary | ICD-10-CM | POA: Diagnosis not present

## 2015-10-05 LAB — CBC
HEMATOCRIT: 39 % (ref 36.0–46.0)
Hemoglobin: 13.6 g/dL (ref 12.0–15.0)
MCH: 30.8 pg (ref 26.0–34.0)
MCHC: 34.9 g/dL (ref 30.0–36.0)
MCV: 88.4 fL (ref 78.0–100.0)
PLATELETS: 162 10*3/uL (ref 150–400)
RBC: 4.41 MIL/uL (ref 3.87–5.11)
RDW: 13.5 % (ref 11.5–15.5)
WBC: 5.4 10*3/uL (ref 4.0–10.5)

## 2015-10-05 LAB — BASIC METABOLIC PANEL
Anion gap: 6 (ref 5–15)
BUN: 15 mg/dL (ref 6–20)
CHLORIDE: 107 mmol/L (ref 101–111)
CO2: 29 mmol/L (ref 22–32)
CREATININE: 0.95 mg/dL (ref 0.44–1.00)
Calcium: 9.2 mg/dL (ref 8.9–10.3)
GFR calc Af Amer: >60 mL/min (ref >60)
GFR calc non Af Amer: >60 mL/min (ref >60)
GLUCOSE: 113 mg/dL — AB (ref 65–99)
POTASSIUM: 3.7 mmol/L (ref 3.5–5.1)
SODIUM: 142 mmol/L (ref 135–145)

## 2015-10-05 LAB — I-STAT TROPONIN, ED: Troponin i, poc: 0 ng/mL (ref 0.00–0.08)

## 2015-10-05 MED ORDER — SUCRALFATE 1 GM/10ML PO SUSP
1.0000 g | Freq: Once | ORAL | Status: AC
  Administered 2015-10-05: 1 g via ORAL
  Filled 2015-10-05: qty 10

## 2015-10-05 MED ORDER — RANITIDINE HCL 150 MG/10ML PO SYRP
300.0000 mg | ORAL_SOLUTION | Freq: Once | ORAL | Status: AC
  Administered 2015-10-05: 300 mg via ORAL
  Filled 2015-10-05: qty 20

## 2015-10-05 MED ORDER — SODIUM CHLORIDE 0.9 % IV BOLUS (SEPSIS)
1000.0000 mL | Freq: Once | INTRAVENOUS | Status: AC
  Administered 2015-10-05: 1000 mL via INTRAVENOUS

## 2015-10-05 NOTE — ED Provider Notes (Signed)
[facility] DEPT Provider Note   CSN: [id number] Arrival date & time: 10/05/15  2007  By signing my name below, I, Lauren [name], attest that this documentation has been prepared under the direction and in the presence of Everlene Balls, MD . Electronically Signed: Higinio [name], Scribe. 10/05/2015. 11:24 PM.  History   Chief Complaint Chief Complaint  Patient presents with  . Arm Pain  . Gastroesophageal Reflux   The history is provided by the patient. No language interpreter was used.   HPI Comments: Lauren [name] is a 60 y.o. female with PMHx of HTN, GERD and HLD, who presents to the Emergency Department complaining of sudden onset, gradually worsening, constant, "aching," left arm pain radiating to her left shoulder and hand that began this morning. She states associated intermittent "tingling" in her left fingers; she denies numbness in her fingers. She also denies recent trauma or injury and shortness of breath.   Pt also reports gradually worsening, "burning," reflux and cough for the past week s/p thyroidectomy on 09/08/15 for hyperactive thyroid; she states she is taking synthroid. She reports she feels like she is "swalowing all the time" and states multiple episodes of vomiting every day. Pt reports she is still feeling similar symptoms now in the ED. She notes she has an appointment with her endocrinologist on 8/24 and her surgeon on 8/28. She states she does not believe her arm pain and reflux is related. She denies fever, change in appetite and difficulty swallowing.   Past Medical History:  Diagnosis Date  . Hyperlipidemia   . Hypertension   . Idiopathic thrombocytopenic purpura (ITP) 1987   not current problem, no hematologist    Patient Active Problem List   Diagnosis Date Noted  . Toxic multinodular goiter 09/08/2015  . Hyperthyroidism 09/04/2015  . Multinodular goiter 04/29/2011  . Chronic cholecystitis 04/29/2011  . 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 ABDOMINAL HYSTERECTOMY  11-18-1998   partial  . CHOLECYSTECTOMY  08/02/2011   Procedure: LAPAROSCOPIC CHOLECYSTECTOMY WITH INTRAOPERATIVE CHOLANGIOGRAM;  Surgeon: Pedro Earls, MD;  Location: [facility] ORS;  Service: General;  Laterality: N/A;  . LAPAROSCOPIC GASTRIC BANDING  05/19/2008  . LUMBAR DISC SURGERY  03/21/1996  . THYROIDECTOMY N/A 09/08/2015   Procedure: TOTAL THYROIDECTOMY;  Surgeon: Armandina Gemma, MD;  Location: [facility] ORS;  Service: General;  Laterality: N/A;  . TONSILLECTOMY      OB History    No data available     Home Medications    Prior to Admission medications   Medication Sig Start Date End Date Taking? Authorizing Provider  aspirin 81 MG tablet Take 81 mg by mouth daily.    Yes Historical Provider, MD  benazepril-hydrochlorthiazide (LOTENSIN HCT) 20-12.5 MG per tablet Take 1 tablet by mouth daily.  03/09/11  Yes Historical Provider, MD  docusate sodium (COLACE) 100 MG capsule Take 100 mg by mouth daily.   Yes Historical Provider, MD  levothyroxine (SYNTHROID, LEVOTHROID) 100 MCG tablet Take 1 tablet (100 mcg total) by mouth daily before breakfast. 09/09/15  Yes Armandina Gemma, MD  metoprolol succinate (TOPROL-XL) 25 MG 24 hr tablet Take 12.5 mg by mouth daily.   Yes Historical Provider, MD  multivitamin-iron-minerals-folic acid (CENTRUM) chewable tablet Chew 1 tablet by mouth daily.   Yes Historical Provider, MD  simvastatin (ZOCOR) 20 MG tablet Take 20 mg by mouth every  evening.   Yes Historical Provider, MD  HYDROcodone-acetaminophen (NORCO/VICODIN) 5-325 MG tablet Take 1-2 tablets by mouth every 4 (four) hours as needed for moderate pain. Patient not taking: Reported on 10/05/2015 09/09/15   Armandina Gemma, MD    Family History Family History  Problem Relation Age of Onset  . Colon polyps Mother   . Heart disease Mother     . Diabetes Father   . Heart disease Father   . Breast cancer Maternal Aunt   . Cancer Maternal Aunt     breast    Social History Social History  Substance Use Topics  . Smoking status: Never Smoker  . Smokeless tobacco: Never Used  . Alcohol use No     Allergies   Review of patient's allergies indicates no known allergies.  Review of Systems Review of Systems 10 systems reviewed and all are negative for acute change except as noted in the HPI.  Physical Exam Updated Vital Signs BP 157/91 (BP Location: Left Arm)   Pulse 64   Temp 97.9 F (36.6 C) (Oral)   Resp 19   Ht 5\' 6"  (1.676 m)   Wt 232 lb (105.2 kg)   SpO2 99%   BMI 37.45 kg/m   Physical Exam  Constitutional: She is oriented to person, place, and time. She appears well-developed and well-nourished. No distress.  HENT:  Head: Normocephalic and atraumatic.  Nose: Nose normal.  Mouth/Throat: Oropharynx is clear and moist. No oropharyngeal exudate.  5 cm horizontal thyroidectomy scar, well healed, no erythema, swelling or tenderness   Eyes: Conjunctivae and EOM are normal. Pupils are equal, round, and reactive to light. No scleral icterus.  Neck: Normal range of motion. Neck supple. No JVD present. No tracheal deviation present. No thyromegaly present.  Cardiovascular: Normal rate, regular rhythm and normal heart sounds.  Exam reveals no gallop and no friction rub.   No murmur heard. Pulmonary/Chest: Effort normal and breath sounds normal. No respiratory distress. She has no wheezes. She exhibits no tenderness.  Abdominal: Soft. Bowel sounds are normal. She exhibits no distension and no mass. There is no tenderness. There is no rebound and no guarding.  Musculoskeletal: Normal range of motion. She exhibits no edema or tenderness.  Lymphadenopathy:    She has no cervical adenopathy.  Neurological: She is alert and oriented to person, place, and time. No cranial nerve deficit. She exhibits normal muscle tone.   Skin: Skin is warm and dry. No rash noted. No erythema. No pallor.  Nursing note and vitals reviewed.  ED Treatments / Results  Labs (all labs ordered are listed, but only abnormal results are displayed) Labs Reviewed  BASIC METABOLIC PANEL - Abnormal; Notable for the following:       Result Value   Glucose, Bld 113 (*)    All other components within normal limits  CBC  TSH  T4, FREE  I-STAT TROPOININ, ED    EKG  EKG Interpretation  Date/Time:  Monday October 05 2015 20:20:36 EDT Ventricular Rate:  84 PR Interval:    QRS Duration: 81 QT Interval:  402 QTC Calculation: 476 R Axis:   25 Text Interpretation:  Sinus rhythm Low voltage, precordial leads Borderline T abnormalities, anterior leads Baseline wander in lead(s) I II aVR No significant change since last tracing Confirmed by Glynn Octave [phone]) on 10/05/2015 11:02:34 PM       Radiology Ct Soft Tissue Neck W Contrast  Result Date: 10/06/2015 CLINICAL DATA:  60 y/o F; burning reflux  and cough for the past week status post thyroidectomy on 09/08/2015. Multiple episodes of vomiting every day. EXAM: CT NECK WITH CONTRAST TECHNIQUE: Multidetector CT imaging of the neck was performed using the standard protocol following the bolus administration of intravenous contrast. CONTRAST:  24mL ISOVUE-300 IOPAMIDOL (ISOVUE-300) INJECTION 61% COMPARISON:  None. FINDINGS: Pharynx and larynx: Patent. Debris within the vallecula. No exophytic lesion is identified. The subglottic airway is normal. Salivary glands: Normal. Thyroid: Status post resection with a small fluid collection in the thyroidectomy bed and thin collection within new the deep subcutaneous fat measuring 13 x 75 mm axially, series 3, image 87, probably representing a postoperative seroma. No marginal enhancement is identified to suggest infection or inflammation. Lymph nodes: No lymphadenopathy. Vascular: Carotid and vertebral arteries of the neck are patent. Mild  calcifications of the carotid bifurcations without significant stenosis. Brief retropharyngeal submucosal course of the proximal right internal carotid artery. Internal jugular veins are patent. Limited intracranial: No acute abnormality identified. Visualized orbits: Unremarkable. Mastoids and visualized paranasal sinuses: Minimal mucosal thickening within maxillary sinuses, otherwise normally aerated. Skeleton: Mild degenerative changes of the cervical spine without high-grade bony canal stenosis or foraminal narrowing. Upper chest: Clear lung apices. Markedly dilated and fluid-filled esophagus downstream to the level of the thyroid gland suggesting a more distal obstruction. IMPRESSION: 1. Dilated and fluid-filled esophagus below the level of the thyroid resection bed suggesting a more downstream obstruction lower in the mediastinum or abdomen. 2. Nonenhancing fluid collections within the thyroidectomy bed and subcutaneous fat overlying the resection site probably representing postoperative seroma. Electronically Signed   By: Kristine Garbe M.D.   On: 10/06/2015 03:35   Procedures Procedures  DIAGNOSTIC STUDIES:  Oxygen Saturation is 99% on RA, normal by my interpretation.    COORDINATION OF CARE:  11:20 PM Discussed treatment [name], which includes CT neck with pt at bedside and pt agreed to [name].  Medications Ordered in ED Medications  sodium chloride 0.9 % bolus 1,000 mL (not administered)  ranitidine (ZANTAC) 150 MG/10ML syrup 300 mg (300 mg Oral Given 10/05/15 2346)  sucralfate (CARAFATE) 1 GM/10ML suspension 1 g (1 g Oral Given 10/05/15 2351)  sodium chloride 0.9 % bolus 1,000 mL (0 mLs Intravenous Stopped 10/06/15 0315)  iopamidol (ISOVUE-300) 61 % injection 75 mL (75 mLs Intravenous Contrast Given 10/06/15 0310)    Initial Impression / Assessment and [name] / ED Course  I have reviewed the triage vital signs and the nursing notes.  Pertinent labs & imaging results that were  available during my care of the patient were reviewed by me and considered in my medical decision making (see chart for details).  Clinical Course    Patient presents to the ED for worsening coughing, reflux and LUE pain after thyroidectomy.  Will obtain CT scan for postop complications.  Also thyroid levels as she has been compliant with her synthroid and may need a dosing adjustment.  She was given ranitidine and sucralfate for possible GERD.  EKG does not show any changes and history is not consistent with ACS.  Will continue to monitor.  12:58 AM TSH is 1.4, in normal range.  CT scan still pending.  4:56 AM CT reveals fluid level in the esophagus.  Will obtain CT of chest/a/p for further eval.  I spoke with Dr. Barkley Bruns who will have a bariatric surgeon evaluate the patient in the morning.  Patient kept NPO and on IVF.  Patient will be signed out for further care.  I personally performed the  services described in this documentation, which was scribed in my presence. The recorded information has been reviewed and is accurate.   Final Clinical Impressions(s) / ED Diagnoses   Final diagnoses:  Vomiting    New Prescriptions New Prescriptions   No medications on file     Everlene Balls, MD 10/06/15 [phone]

## 2015-10-05 NOTE — ED Triage Notes (Signed)
Pt states that over the past several days she has had L arm pain and acid indigestion over the past week. Concerned about possible heart attack. Also had recent thyroid surgery 3 weeks ago and is on levothyroxine. Alert and oriented.

## 2015-10-06 ENCOUNTER — Emergency Department (HOSPITAL_COMMUNITY): Payer: BLUE CROSS/BLUE SHIELD

## 2015-10-06 ENCOUNTER — Encounter (HOSPITAL_COMMUNITY): Payer: Self-pay

## 2015-10-06 DIAGNOSIS — K449 Diaphragmatic hernia without obstruction or gangrene: Secondary | ICD-10-CM | POA: Diagnosis not present

## 2015-10-06 DIAGNOSIS — R05 Cough: Secondary | ICD-10-CM | POA: Diagnosis not present

## 2015-10-06 DIAGNOSIS — R918 Other nonspecific abnormal finding of lung field: Secondary | ICD-10-CM | POA: Diagnosis not present

## 2015-10-06 DIAGNOSIS — K222 Esophageal obstruction: Secondary | ICD-10-CM | POA: Diagnosis not present

## 2015-10-06 LAB — TSH: TSH: 1.475 u[IU]/mL (ref 0.350–4.500)

## 2015-10-06 LAB — T4, FREE: FREE T4: 0.98 ng/dL (ref 0.61–1.12)

## 2015-10-06 MED ORDER — PANTOPRAZOLE SODIUM 40 MG PO TBEC: 40.0000 mg | DELAYED_RELEASE_TABLET | Freq: Two times a day (BID) | ORAL | 1 refills | Status: DC

## 2015-10-06 MED ORDER — SODIUM CHLORIDE 0.9 % IV BOLUS (SEPSIS): 1000.0000 mL | Freq: Once | INTRAVENOUS | Status: DC

## 2015-10-06 MED ORDER — IOPAMIDOL (ISOVUE-300) INJECTION 61%
75.0000 mL | Freq: Once | INTRAVENOUS | Status: AC | PRN
  Administered 2015-10-06: 75 mL via INTRAVENOUS

## 2015-10-06 MED ORDER — SODIUM CHLORIDE 0.9 % IV SOLN
Freq: Once | INTRAVENOUS | Status: AC
  Administered 2015-10-06: 06:00:00 via INTRAVENOUS

## 2015-10-06 NOTE — ED Notes (Signed)
Pt to CT

## 2015-10-06 NOTE — Consult Note (Signed)
Reason for Consult: Obstruction of esophagus Referring Physician: Dr. Everitt Amber is an 60 y.o. female.  HPI: She presented to the emergency department late last night complaining of persistent left arm pain from her left shoulder to her hand. She was concerned because her coworker had a massive heart attack here recently. She also has been having more problems with what she feels is reflux and cough for the past week. She underwent a thyroidectomy 09/08/2015 for hyperthyroidism and I multinodular goiter. She's been regurgitating clear fluid and some food particles. Of note is that she is status post laparoscopic band placement for morbid obesity with her last band adjustment 4 years ago by Dr. Kaylyn Lim. Ever since the band was placed, she's had problems with regurgitation 2-3 times a week. CT scan of the neck demonstrated a postoperative seroma in the neck. Also demonstrated a dilated esophagus. CT of chest and abdomen demonstrated multiple pulmonary nodules. There is also a dilated esophagus and a decompressed stomach. The lap band appeared to be in proper position with the proper phi angle.  Because of the esophageal obstruction, we were asked to see her.  Past Medical History:  Diagnosis Date  . Hyperlipidemia   . Hypertension   . Idiopathic thrombocytopenic purpura (ITP) 1987   not current problem, no hematologist   Multinodular goiter  Hyperthyroidism  Past Surgical History:  Procedure Laterality Date  . ABDOMINAL HYSTERECTOMY  11-18-1998   partial  . CHOLECYSTECTOMY  08/02/2011   Procedure: LAPAROSCOPIC CHOLECYSTECTOMY WITH INTRAOPERATIVE CHOLANGIOGRAM;  Surgeon: Pedro Earls, MD;  Location: [facility];  Service: General;  Laterality: N/A;  . LAPAROSCOPIC GASTRIC BANDING  05/19/2008  . LUMBAR DISC SURGERY  03/21/1996  . THYROIDECTOMY N/A 09/08/2015   Procedure: TOTAL THYROIDECTOMY;  Surgeon: Armandina Gemma, MD;  Location: [facility];  Service: General;  Laterality: N/A;  .  TONSILLECTOMY      Family History  Problem Relation Age of Onset  . Colon polyps Mother   . Heart disease Mother   . Diabetes Father   . Heart disease Father   . Breast cancer Maternal Aunt   . Cancer Maternal Aunt     breast    Social History:  reports that she has never smoked. She has never used smokeless tobacco. She reports that she does not drink alcohol or use drugs.  Allergies: No Known Allergies  Prior to Admission medications   Medication Sig Start Date End Date Taking? Authorizing Provider  aspirin 81 MG tablet Take 81 mg by mouth daily.    Yes Historical Provider, MD  benazepril-hydrochlorthiazide (LOTENSIN HCT) 20-12.5 MG per tablet Take 1 tablet by mouth daily.  03/09/11  Yes Historical Provider, MD  docusate sodium (COLACE) 100 MG capsule Take 100 mg by mouth daily.   Yes Historical Provider, MD  levothyroxine (SYNTHROID, LEVOTHROID) 100 MCG tablet Take 1 tablet (100 mcg total) by mouth daily before breakfast. 09/09/15  Yes Armandina Gemma, MD  metoprolol succinate (TOPROL-XL) 25 MG 24 hr tablet Take 12.5 mg by mouth daily.   Yes Historical Provider, MD  multivitamin-iron-minerals-folic acid (CENTRUM) chewable tablet Chew 1 tablet by mouth daily.   Yes Historical Provider, MD  simvastatin (ZOCOR) 20 MG tablet Take 20 mg by mouth every evening.   Yes Historical Provider, MD  HYDROcodone-acetaminophen (NORCO/VICODIN) 5-325 MG tablet Take 1-2 tablets by mouth every 4 (four) hours as needed for moderate pain. Patient not taking: Reported on 10/05/2015 09/09/15   Armandina Gemma, MD  Results for orders placed or performed during the hospital encounter of 10/05/15 (from the past 48 hour(s))  Basic metabolic panel     Status: Abnormal   Collection Time: 10/05/15  8:52 PM  Result Value Ref Range   Sodium 142 135 - 145 mmol/L   Potassium 3.7 3.5 - 5.1 mmol/L   Chloride 107 101 - 111 mmol/L   CO2 29 22 - 32 mmol/L   Glucose, Bld 113 (H) 65 - 99 mg/dL   BUN 15 6 - 20 mg/dL    Creatinine, Ser 0.95 0.44 - 1.00 mg/dL   Calcium 9.2 8.9 - 10.3 mg/dL   GFR calc non Af Amer >60 >60 mL/min   GFR calc Af Amer >60 >60 mL/min    Comment: (NOTE) The eGFR has been calculated using the CKD EPI equation. This calculation has not been validated in all clinical situations. eGFR's persistently <60 mL/min signify possible Chronic Kidney Disease.    Anion gap 6 5 - 15  CBC     Status: None   Collection Time: 10/05/15  8:52 PM  Result Value Ref Range   WBC 5.4 4.0 - 10.5 K/uL   RBC 4.41 3.87 - 5.11 MIL/uL   Hemoglobin 13.6 12.0 - 15.0 g/dL   HCT 39.0 36.0 - 46.0 %   MCV 88.4 78.0 - 100.0 fL   MCH 30.8 26.0 - 34.0 pg   MCHC 34.9 30.0 - 36.0 g/dL   RDW 13.5 11.5 - 15.5 %   Platelets 162 150 - 400 K/uL  TSH     Status: None   Collection Time: 10/05/15  8:52 PM  Result Value Ref Range   TSH 1.475 0.350 - 4.500 uIU/mL  I-stat troponin, ED     Status: None   Collection Time: 10/05/15  9:01 PM  Result Value Ref Range   Troponin i, poc 0.00 0.00 - 0.08 ng/mL   Comment 3            Comment: Due to the release kinetics of cTnI, a negative result within the first hours of the onset of symptoms does not rule out myocardial infarction with certainty. If myocardial infarction is still suspected, repeat the test at appropriate intervals.     Ct Abdomen Pelvis Wo Contrast  Addendum Date: 10/06/2015   ADDENDUM REPORT: 10/06/2015 06:05 ADDENDUM: Addendum created at request of referring provider for Phi angle of the gastric band (angle measured between vertical line to the vertebral column and through the long axis of the gastric band.) The Phi angle is 52 degrees when measured on the scout view of the abdomen. This is within normal limits. The single sulcal measured on scout from CT of the chest with same angle measurement. Electronically Signed   By: Jeb Levering M.D.   On: 10/06/2015 06:05   Result Date: 10/06/2015 CLINICAL DATA:  Nausea and vomiting. Symptoms for 2 weeks  post thyroidectomy. Fluid-filled esophagus on CT neck. EXAM: CT ABDOMEN AND PELVIS WITHOUT CONTRAST TECHNIQUE: Multidetector CT imaging of the abdomen and pelvis was performed following the standard protocol without IV contrast. COMPARISON:  CT 10/11/2008 FINDINGS: Lower chest: There is a 5 mm subpleural nodule in the left lower lobe image 7 series 3. 3 mm pleural-based nodule in the left lower lobe image 13 series 3. 3 mm subpleural nodule in the left lower lobe image 16. It is unclear which of these nodules represents the previous left lower lobe pulmonary nodule. Subpleural fat noted in both lung bases. No pleural  fluid. Fluid distending the distal esophagus. Small hiatal hernia. Liver: Prominent size liver with borderline steatosis. No focal lesion. Hepatobiliary: Clips in the gallbladder fossa postcholecystectomy. Common bile duct measures 12 mm at the porta hepatis this node tapering, likely sequela of cholecystectomy. Pancreas: No ductal dilatation or inflammation. Spleen: Upper normal in size measuring 13 cm.  No focal abnormality Adrenal glands: Stable left adrenal thickening. Right adrenal gland is normal. Kidneys: Symmetric excretion of intravenous contrast from prior contrast-enhanced neck CT. No hydronephrosis. No perinephric edema. Mild thinning of renal parenchyma. Stomach/Bowel: Gastric band in place, in correct position and orientation. Tubing appears intact. Small hiatal hernia and thickening of the distal esophagus proximal to the band. Stomach distal to the band is decompressed. There are no dilated or thickened small bowel loops. Moderate volume of stool throughout the colon without colonic wall thickening. Very minimal sigmoid colonic diverticulosis without diverticulitis. The appendix is normal. Vascular/Lymphatic: No retroperitoneal adenopathy. Abdominal aorta is normal in caliber. Mild atherosclerosis of the abdominal aorta and its branches. Reproductive: The uterus is surgically absent.  Both ovaries are normal in size. Bladder: Minimally distended with excreted intravenous contrast. Other: No free air, free fluid, or intra-abdominal fluid collection. Small fat containing umbilical hernia. Laxity of the anterior abdominal wall musculature. Musculoskeletal: There are no acute or suspicious osseous abnormalities. Degenerative change in the lower lumbar spine. IMPRESSION: 1. Gastric band in place in appropriate orientation and position. Small hiatal hernia and thickening of the distal esophagus just proximal to the band. The more proximal esophagus is fluid-filled. No abnormal distension of dizzy stomach distal to the band, large or small bowel. 2. No acute abnormality in the abdomen/pelvis. 3. Left lower lobe pulmonary nodules. Largest nodule measures 5 mm. It is unclear which of these nodules corresponds to that seen on prior abdominal CT. Full field of view chest CT is pending at this time. Electronically Signed: By: Jeb Levering M.D. On: 10/06/2015 05:03   Ct Soft Tissue Neck W Contrast  Result Date: 10/06/2015 CLINICAL DATA:  60 y/o F; burning reflux and cough for the past week status post thyroidectomy on 09/08/2015. Multiple episodes of vomiting every day. EXAM: CT NECK WITH CONTRAST TECHNIQUE: Multidetector CT imaging of the neck was performed using the standard protocol following the bolus administration of intravenous contrast. CONTRAST:  88m ISOVUE-300 IOPAMIDOL (ISOVUE-300) INJECTION 61% COMPARISON:  None. FINDINGS: Pharynx and larynx: Patent. Debris within the vallecula. No exophytic lesion is identified. The subglottic airway is normal. Salivary glands: Normal. Thyroid: Status post resection with a small fluid collection in the thyroidectomy bed and thin collection within new the deep subcutaneous fat measuring 13 x 75 mm axially, series 3, image 87, probably representing a postoperative seroma. No marginal enhancement is identified to suggest infection or inflammation. Lymph  nodes: No lymphadenopathy. Vascular: Carotid and vertebral arteries of the neck are patent. Mild calcifications of the carotid bifurcations without significant stenosis. Brief retropharyngeal submucosal course of the proximal right internal carotid artery. Internal jugular veins are patent. Limited intracranial: No acute abnormality identified. Visualized orbits: Unremarkable. Mastoids and visualized paranasal sinuses: Minimal mucosal thickening within maxillary sinuses, otherwise normally aerated. Skeleton: Mild degenerative changes of the cervical spine without high-grade bony canal stenosis or foraminal narrowing. Upper chest: Clear lung apices. Markedly dilated and fluid-filled esophagus downstream to the level of the thyroid gland suggesting a more distal obstruction. IMPRESSION: 1. Dilated and fluid-filled esophagus below the level of the thyroid resection bed suggesting a more downstream obstruction lower in the mediastinum  or abdomen. 2. Nonenhancing fluid collections within the thyroidectomy bed and subcutaneous fat overlying the resection site probably representing postoperative seroma. Electronically Signed   By: Kristine Garbe M.D.   On: 10/06/2015 03:35   Ct Chest Wo Contrast  Result Date: 10/06/2015 CLINICAL DATA:  Nausea and vomiting for 2 weeks. Symptoms since thyroidectomy. EXAM: CT CHEST WITHOUT CONTRAST TECHNIQUE: Multidetector CT imaging of the chest was performed following the standard protocol without IV contrast. COMPARISON:  Contrast-enhanced CT neck and unenhanced CT abdomen/ pelvis earlier this day. No prior chest CT. CT abdomen/pelvis lung bases 10/03/2008 FINDINGS: Cardiovascular: Normal caliber thoracic aorta. Only trace calcified atherosclerosis. There is mild multi chamber cardiomegaly, primarily right heart distension. Mediastinum/Nodes: Small highest mediastinal nodes, largest paraesophageal measuring 7 mm short axis. No evidence of hilar adenopathy allowing for lack  contrast. No pericardial fluid. Post thyroidectomy, thyroid bed better assessed on dedicated neck CT with contrast. Esophagus: Fluid-filled in the mid and proximal portion. Small hiatal hernia with wall thickening of the distal esophagus. Gastric band in place. No paraesophageal inflammation. Lungs/Pleura: Multiple bilateral pulmonary nodules. These appear to be a pleural and subpleural in location bilaterally. Largest nodule is in the left lower lobe measuring 5 mm image 91 series 4. There is no dominant pulmonary mass. No focal airspace opacity. No evidence of pulmonary edema. Upper Abdomen: Characterized on abdominal CT earlier this hour. Gastric band in place. Musculoskeletal: There are no acute or suspicious osseous abnormalities. IMPRESSION: 1. Fluid-filled esophagus with small hiatal hernia and thickening of the distal esophagus just proximal to the gastric band. This may be related to reflux or delayed transit. Delayed transit may be secondary to the gastric band. 2. Post recent thyroidectomy, thyroid bed better assessed on contrast-enhanced CT neck earlier this day. 3. Multiple bilateral pulmonary nodules, largest 5 mm the left lower lobe. The superior pleural and subpleural in location. At least 1 of the left lower lobe nodules was present on abdominal CT from 2010, and is likely unchanged. No follow-up needed if patient is low-risk (and has no known or suspected primary neoplasm). Non-contrast chest CT can be considered in 12 months if patient is high-risk. This recommendation follows the consensus statement: Guidelines for Management of Incidental Pulmonary Nodules Detected on CT Images:From the Fleischner Society 2017; published online before print (10.1148/radiol.9675916384). Electronically Signed   By: Jeb Levering M.D.   On: 10/06/2015 05:22    Review of Systems  HENT:       Lips are dry.  Respiratory: Positive for cough. Negative for shortness of breath.   Cardiovascular: Positive for  chest pain.  Gastrointestinal: Positive for constipation, heartburn, nausea and vomiting.       Increased abdominal bloating since thyroidectomy  Musculoskeletal:       Left arm pain   Blood pressure 156/95, pulse 73, temperature 97.9 F (36.6 C), temperature source Oral, resp. rate 12, height _0  (1.676 m), weight 105.2 kg (232 lb), SpO2 100 %. Physical Exam  Constitutional:  Obese female in no acute distress.  HENT:  Head: Normocephalic and atraumatic.  Mucous membranes are dry.  Eyes: EOM are normal. No scleral icterus.  Neck:  Lower transverse scar that is clean with minimal swelling. Voice is strong.  Cardiovascular: Normal rate and regular rhythm.   GI: Soft. She exhibits distension. There is no tenderness.  Multiple scars present. Palpable port right mid abdomen.   Neurological: She is alert.  Skin: Skin is warm and dry.  Psychiatric: She has a normal mood  and affect. Her behavior is normal.   Suture: The area in the abdomen where the lap band port was was sterilely prepped. I then aspirated 5 cc of clear fluid from it.  Assessment/Plan: 1. Esophageal obstruction with distal thickening (likely esophagitis)-most likely secondary to lap band being too tight; been having intermittent problems with regurgitation 2-3 times a week ever since the band was placed but the frequency of the episodes has increased within the past week. The lap band fluid was removed.  2. Status post total thyroidectomy-wound is healing well.  3. Multiple pulmonary nodules.  Plan: She is able to tolerate liquids once band fluid emptied.  I spoke with Dr. Hassell Done as well.  Will discharge her from ED on a liquid diet for 2 days then she can restart solid food.  Will start her on a PPI BID.  Will arrange for her to see Dr. Hassell Done for possible band refill in 1-2 weeks.  She knows to call if she has problems swallowing prior to that appointment.  As for the pulmonary nodules, will have her follow up with her  PCP, Dr. Justin Mend.  Darelle Kings J 10/06/2015, 7:39 AM

## 2015-10-06 NOTE — ED Notes (Signed)
Pt able to tolerate water without emesis.

## 2015-10-06 NOTE — ED Notes (Signed)
Dr. Elinor Parkinson at bedside and states he drained the fluid in her gastric band.  MD also states that pt is able to have clear liquids w/out a straw to trial her here in the ED.  They will re-evaluate her in a few hours to see if she needs to be admitted. Pt a/o x 4. ABCD's intact.  Denies any pain and is ambulatory with a steady gait.

## 2015-10-06 NOTE — Discharge Instructions (Addendum)
Liquid diet today and tomorrow.  If tolerated, may start solid food on Thursday.  Take Protonix or over the counter Prilosec (Omeprazole) twice a day.  Call if you still have frequent nausea and vomiting.  Our office will call you to make an appointment with Dr. Hassell Done.  If you have not heard from the office by tomorrow afternoon, please call [phone]).  You should see Dr. Justin Mend regarding the lung nodules seen on the CT scan.

## 2015-10-08 DIAGNOSIS — C73 Malignant neoplasm of thyroid gland: Secondary | ICD-10-CM | POA: Diagnosis not present

## 2015-10-08 DIAGNOSIS — E890 Postprocedural hypothyroidism: Secondary | ICD-10-CM | POA: Diagnosis not present

## 2015-10-23 DIAGNOSIS — K5900 Constipation, unspecified: Secondary | ICD-10-CM | POA: Diagnosis not present

## 2015-10-24 ENCOUNTER — Other Ambulatory Visit: Payer: Self-pay | Admitting: Surgery

## 2015-10-24 DIAGNOSIS — K5909 Other constipation: Secondary | ICD-10-CM

## 2015-11-09 ENCOUNTER — Ambulatory Visit
Admission: RE | Admit: 2015-11-09 | Discharge: 2015-11-09 | Disposition: A | Discharge status: Home / Self Care | Payer: BLUE CROSS/BLUE SHIELD | Source: Ambulatory Visit | Attending: Surgery | Admitting: Surgery

## 2015-11-09 DIAGNOSIS — K5909 Other constipation: Secondary | ICD-10-CM

## 2015-11-09 DIAGNOSIS — K5900 Constipation, unspecified: Secondary | ICD-10-CM | POA: Diagnosis not present

## 2015-11-10 DIAGNOSIS — C73 Malignant neoplasm of thyroid gland: Secondary | ICD-10-CM | POA: Diagnosis not present

## 2015-11-10 DIAGNOSIS — E890 Postprocedural hypothyroidism: Secondary | ICD-10-CM | POA: Diagnosis not present

## 2015-11-19 ENCOUNTER — Other Ambulatory Visit: Payer: Self-pay | Admitting: Internal Medicine

## 2015-11-19 DIAGNOSIS — C73 Malignant neoplasm of thyroid gland: Secondary | ICD-10-CM | POA: Diagnosis not present

## 2015-11-19 DIAGNOSIS — E890 Postprocedural hypothyroidism: Secondary | ICD-10-CM | POA: Diagnosis not present

## 2015-11-23 ENCOUNTER — Ambulatory Visit
Admission: RE | Admit: 2015-11-23 | Discharge: 2015-11-23 | Disposition: A | Discharge status: Home / Self Care | Payer: BLUE CROSS/BLUE SHIELD | Source: Ambulatory Visit | Attending: Internal Medicine | Admitting: Internal Medicine

## 2015-11-23 DIAGNOSIS — E890 Postprocedural hypothyroidism: Secondary | ICD-10-CM | POA: Diagnosis not present

## 2015-11-23 DIAGNOSIS — C73 Malignant neoplasm of thyroid gland: Secondary | ICD-10-CM

## 2015-12-04 DIAGNOSIS — M659 Synovitis and tenosynovitis, unspecified: Secondary | ICD-10-CM | POA: Diagnosis not present

## 2015-12-04 DIAGNOSIS — M7751 Other enthesopathy of right foot: Secondary | ICD-10-CM | POA: Diagnosis not present

## 2015-12-04 DIAGNOSIS — M899 Disorder of bone, unspecified: Secondary | ICD-10-CM | POA: Diagnosis not present

## 2015-12-08 ENCOUNTER — Other Ambulatory Visit (HOSPITAL_COMMUNITY): Payer: Self-pay | Admitting: Internal Medicine

## 2015-12-08 DIAGNOSIS — C73 Malignant neoplasm of thyroid gland: Secondary | ICD-10-CM

## 2015-12-11 DIAGNOSIS — M7751 Other enthesopathy of right foot: Secondary | ICD-10-CM | POA: Diagnosis not present

## 2015-12-11 DIAGNOSIS — M76821 Posterior tibial tendinitis, right leg: Secondary | ICD-10-CM | POA: Diagnosis not present

## 2015-12-14 ENCOUNTER — Other Ambulatory Visit: Payer: Self-pay | Admitting: Family Medicine

## 2015-12-14 DIAGNOSIS — Z1231 Encounter for screening mammogram for malignant neoplasm of breast: Secondary | ICD-10-CM

## 2015-12-17 DIAGNOSIS — Z9884 Bariatric surgery status: Secondary | ICD-10-CM | POA: Diagnosis not present

## 2015-12-18 DIAGNOSIS — L814 Other melanin hyperpigmentation: Secondary | ICD-10-CM | POA: Diagnosis not present

## 2015-12-18 DIAGNOSIS — D1801 Hemangioma of skin and subcutaneous tissue: Secondary | ICD-10-CM | POA: Diagnosis not present

## 2015-12-18 DIAGNOSIS — L821 Other seborrheic keratosis: Secondary | ICD-10-CM | POA: Diagnosis not present

## 2015-12-18 DIAGNOSIS — D229 Melanocytic nevi, unspecified: Secondary | ICD-10-CM | POA: Diagnosis not present

## 2015-12-18 DIAGNOSIS — D485 Neoplasm of uncertain behavior of skin: Secondary | ICD-10-CM | POA: Diagnosis not present

## 2015-12-18 DIAGNOSIS — D235 Other benign neoplasm of skin of trunk: Secondary | ICD-10-CM | POA: Diagnosis not present

## 2015-12-23 ENCOUNTER — Encounter (HOSPITAL_COMMUNITY)
Admission: RE | Admit: 2015-12-23 | Discharge: 2015-12-23 | Disposition: A | Discharge status: Home / Self Care | Payer: BLUE CROSS/BLUE SHIELD | Source: Ambulatory Visit | Attending: Internal Medicine | Admitting: Internal Medicine

## 2015-12-23 DIAGNOSIS — C73 Malignant neoplasm of thyroid gland: Secondary | ICD-10-CM

## 2015-12-23 MED ORDER — THYROTROPIN ALFA 1.1 MG IM SOLR
INTRAMUSCULAR | Status: AC
  Filled 2015-12-23: qty 0.9

## 2015-12-23 MED ORDER — THYROTROPIN ALFA 1.1 MG IM SOLR
0.9000 mg | INTRAMUSCULAR | Status: AC
  Administered 2015-12-23: 0.9 mg via INTRAMUSCULAR

## 2015-12-24 ENCOUNTER — Encounter (HOSPITAL_COMMUNITY)
Admission: RE | Admit: 2015-12-24 | Discharge: 2015-12-24 | Disposition: A | Discharge status: Home / Self Care | Payer: BLUE CROSS/BLUE SHIELD | Source: Ambulatory Visit | Attending: Internal Medicine | Admitting: Internal Medicine

## 2015-12-24 DIAGNOSIS — C73 Malignant neoplasm of thyroid gland: Secondary | ICD-10-CM | POA: Diagnosis not present

## 2015-12-24 MED ORDER — THYROTROPIN ALFA 1.1 MG IM SOLR
0.9000 mg | INTRAMUSCULAR | Status: AC
  Administered 2015-12-24: 0.9 mg via INTRAMUSCULAR

## 2015-12-24 MED ORDER — THYROTROPIN ALFA 1.1 MG IM SOLR
INTRAMUSCULAR | Status: AC
  Filled 2015-12-24: qty 0.9

## 2015-12-25 ENCOUNTER — Encounter (HOSPITAL_COMMUNITY)
Admission: RE | Admit: 2015-12-25 | Discharge: 2015-12-25 | Disposition: A | Discharge status: Home / Self Care | Payer: BLUE CROSS/BLUE SHIELD | Source: Ambulatory Visit | Attending: Internal Medicine | Admitting: Internal Medicine

## 2015-12-25 DIAGNOSIS — C73 Malignant neoplasm of thyroid gland: Secondary | ICD-10-CM | POA: Diagnosis not present

## 2015-12-25 MED ORDER — SODIUM IODIDE I 131 CAPSULE
47.6000 | Freq: Once | INTRAVENOUS | Status: AC | PRN
  Administered 2015-12-25: 47.6 via ORAL

## 2016-01-04 ENCOUNTER — Encounter (HOSPITAL_COMMUNITY)
Admission: RE | Admit: 2016-01-04 | Discharge: 2016-01-04 | Disposition: A | Discharge status: Home / Self Care | Payer: BLUE CROSS/BLUE SHIELD | Source: Ambulatory Visit | Attending: Internal Medicine | Admitting: Internal Medicine

## 2016-01-04 DIAGNOSIS — C73 Malignant neoplasm of thyroid gland: Secondary | ICD-10-CM | POA: Diagnosis not present

## 2016-01-11 DIAGNOSIS — C73 Malignant neoplasm of thyroid gland: Secondary | ICD-10-CM | POA: Diagnosis not present

## 2016-01-11 DIAGNOSIS — R918 Other nonspecific abnormal finding of lung field: Secondary | ICD-10-CM | POA: Diagnosis not present

## 2016-01-11 DIAGNOSIS — E890 Postprocedural hypothyroidism: Secondary | ICD-10-CM | POA: Diagnosis not present

## 2016-01-12 ENCOUNTER — Ambulatory Visit
Admission: RE | Admit: 2016-01-12 | Discharge: 2016-01-12 | Disposition: A | Discharge status: Home / Self Care | Payer: BLUE CROSS/BLUE SHIELD | Source: Ambulatory Visit | Attending: Family Medicine | Admitting: Family Medicine

## 2016-01-12 DIAGNOSIS — Z1231 Encounter for screening mammogram for malignant neoplasm of breast: Secondary | ICD-10-CM

## 2016-01-13 DIAGNOSIS — Z9884 Bariatric surgery status: Secondary | ICD-10-CM | POA: Diagnosis not present

## 2016-01-15 ENCOUNTER — Other Ambulatory Visit: Payer: Self-pay | Admitting: Surgery

## 2016-01-15 DIAGNOSIS — L7211 Pilar cyst: Secondary | ICD-10-CM | POA: Diagnosis not present

## 2016-01-15 DIAGNOSIS — D485 Neoplasm of uncertain behavior of skin: Secondary | ICD-10-CM | POA: Diagnosis not present

## 2016-01-15 DIAGNOSIS — D367 Benign neoplasm of other specified sites: Secondary | ICD-10-CM | POA: Diagnosis not present

## 2016-02-17 DIAGNOSIS — Z9884 Bariatric surgery status: Secondary | ICD-10-CM | POA: Diagnosis not present

## 2016-02-25 DIAGNOSIS — C73 Malignant neoplasm of thyroid gland: Secondary | ICD-10-CM | POA: Diagnosis not present

## 2016-04-06 DIAGNOSIS — Z9884 Bariatric surgery status: Secondary | ICD-10-CM | POA: Diagnosis not present

## 2016-04-17 DIAGNOSIS — R42 Dizziness and giddiness: Secondary | ICD-10-CM | POA: Diagnosis not present

## 2016-04-17 DIAGNOSIS — I10 Essential (primary) hypertension: Secondary | ICD-10-CM | POA: Diagnosis not present

## 2016-04-19 DIAGNOSIS — C73 Malignant neoplasm of thyroid gland: Secondary | ICD-10-CM | POA: Diagnosis not present

## 2016-04-24 DIAGNOSIS — H10022 Other mucopurulent conjunctivitis, left eye: Secondary | ICD-10-CM | POA: Diagnosis not present

## 2016-04-24 DIAGNOSIS — I10 Essential (primary) hypertension: Secondary | ICD-10-CM | POA: Diagnosis not present

## 2016-05-11 DIAGNOSIS — Z0000 Encounter for general adult medical examination without abnormal findings: Secondary | ICD-10-CM | POA: Diagnosis not present

## 2016-05-11 DIAGNOSIS — Z5181 Encounter for therapeutic drug level monitoring: Secondary | ICD-10-CM | POA: Diagnosis not present

## 2016-05-11 DIAGNOSIS — E785 Hyperlipidemia, unspecified: Secondary | ICD-10-CM | POA: Diagnosis not present

## 2016-05-11 DIAGNOSIS — Z6841 Body Mass Index (BMI) 40.0 and over, adult: Secondary | ICD-10-CM | POA: Diagnosis not present

## 2016-05-11 DIAGNOSIS — I10 Essential (primary) hypertension: Secondary | ICD-10-CM | POA: Diagnosis not present

## 2016-06-02 DIAGNOSIS — Z9884 Bariatric surgery status: Secondary | ICD-10-CM | POA: Diagnosis not present

## 2016-06-03 DIAGNOSIS — M25562 Pain in left knee: Secondary | ICD-10-CM | POA: Diagnosis not present

## 2016-06-03 DIAGNOSIS — G8929 Other chronic pain: Secondary | ICD-10-CM | POA: Diagnosis not present

## 2016-06-09 DIAGNOSIS — I10 Essential (primary) hypertension: Secondary | ICD-10-CM | POA: Diagnosis not present

## 2016-06-16 DIAGNOSIS — G8929 Other chronic pain: Secondary | ICD-10-CM | POA: Diagnosis not present

## 2016-06-16 DIAGNOSIS — M25562 Pain in left knee: Secondary | ICD-10-CM | POA: Diagnosis not present

## 2016-06-22 DIAGNOSIS — M23322 Other meniscus derangements, posterior horn of medial meniscus, left knee: Secondary | ICD-10-CM | POA: Diagnosis not present

## 2016-06-22 DIAGNOSIS — G8929 Other chronic pain: Secondary | ICD-10-CM | POA: Diagnosis not present

## 2016-06-22 DIAGNOSIS — M25562 Pain in left knee: Secondary | ICD-10-CM | POA: Diagnosis not present

## 2016-07-07 DIAGNOSIS — Z09 Encounter for follow-up examination after completed treatment for conditions other than malignant neoplasm: Secondary | ICD-10-CM | POA: Diagnosis not present

## 2016-07-08 DIAGNOSIS — E890 Postprocedural hypothyroidism: Secondary | ICD-10-CM | POA: Diagnosis not present

## 2016-07-08 DIAGNOSIS — C73 Malignant neoplasm of thyroid gland: Secondary | ICD-10-CM | POA: Diagnosis not present

## 2016-07-14 DIAGNOSIS — E890 Postprocedural hypothyroidism: Secondary | ICD-10-CM | POA: Diagnosis not present

## 2016-07-14 DIAGNOSIS — Z85850 Personal history of malignant neoplasm of thyroid: Secondary | ICD-10-CM | POA: Diagnosis not present

## 2016-07-15 DIAGNOSIS — M23322 Other meniscus derangements, posterior horn of medial meniscus, left knee: Secondary | ICD-10-CM | POA: Diagnosis not present

## 2016-07-15 DIAGNOSIS — M25562 Pain in left knee: Secondary | ICD-10-CM | POA: Diagnosis not present

## 2016-07-15 DIAGNOSIS — G8929 Other chronic pain: Secondary | ICD-10-CM | POA: Diagnosis not present

## 2016-08-02 DIAGNOSIS — G8918 Other acute postprocedural pain: Secondary | ICD-10-CM | POA: Diagnosis not present

## 2016-08-02 DIAGNOSIS — M23222 Derangement of posterior horn of medial meniscus due to old tear or injury, left knee: Secondary | ICD-10-CM | POA: Diagnosis not present

## 2016-08-02 DIAGNOSIS — M94262 Chondromalacia, left knee: Secondary | ICD-10-CM | POA: Diagnosis not present

## 2016-08-02 DIAGNOSIS — M23322 Other meniscus derangements, posterior horn of medial meniscus, left knee: Secondary | ICD-10-CM | POA: Diagnosis not present

## 2016-09-14 DIAGNOSIS — Z9884 Bariatric surgery status: Secondary | ICD-10-CM | POA: Diagnosis not present

## 2016-09-30 DIAGNOSIS — M1712 Unilateral primary osteoarthritis, left knee: Secondary | ICD-10-CM | POA: Diagnosis not present

## 2016-11-30 DIAGNOSIS — M1712 Unilateral primary osteoarthritis, left knee: Secondary | ICD-10-CM | POA: Diagnosis not present

## 2016-12-12 DIAGNOSIS — M25562 Pain in left knee: Secondary | ICD-10-CM | POA: Diagnosis not present

## 2017-01-20 DIAGNOSIS — M67871 Other specified disorders of synovium, right ankle and foot: Secondary | ICD-10-CM | POA: Diagnosis not present

## 2017-01-20 DIAGNOSIS — M79672 Pain in left foot: Secondary | ICD-10-CM | POA: Diagnosis not present

## 2017-01-20 DIAGNOSIS — M79671 Pain in right foot: Secondary | ICD-10-CM | POA: Diagnosis not present

## 2017-01-20 DIAGNOSIS — M67872 Other specified disorders of synovium, left ankle and foot: Secondary | ICD-10-CM | POA: Diagnosis not present

## 2017-02-17 DIAGNOSIS — M67979 Unspecified disorder of synovium and tendon, unspecified ankle and foot: Secondary | ICD-10-CM | POA: Diagnosis not present

## 2017-02-17 DIAGNOSIS — M6790 Unspecified disorder of synovium and tendon, unspecified site: Secondary | ICD-10-CM | POA: Insufficient documentation

## 2017-02-20 ENCOUNTER — Other Ambulatory Visit: Payer: Self-pay | Admitting: Family Medicine

## 2017-02-20 DIAGNOSIS — Z1231 Encounter for screening mammogram for malignant neoplasm of breast: Secondary | ICD-10-CM

## 2017-03-14 ENCOUNTER — Ambulatory Visit
Admission: RE | Admit: 2017-03-14 | Discharge: 2017-03-14 | Disposition: A | Discharge status: Home / Self Care | Payer: BLUE CROSS/BLUE SHIELD | Source: Ambulatory Visit | Attending: Family Medicine | Admitting: Family Medicine

## 2017-03-14 DIAGNOSIS — Z1231 Encounter for screening mammogram for malignant neoplasm of breast: Secondary | ICD-10-CM

## 2017-03-17 DIAGNOSIS — M67862 Other specified disorders of synovium, left knee: Secondary | ICD-10-CM | POA: Diagnosis not present

## 2017-03-17 DIAGNOSIS — M67861 Other specified disorders of synovium, right knee: Secondary | ICD-10-CM | POA: Diagnosis not present

## 2017-06-06 DIAGNOSIS — E890 Postprocedural hypothyroidism: Secondary | ICD-10-CM | POA: Diagnosis not present

## 2017-06-06 DIAGNOSIS — E785 Hyperlipidemia, unspecified: Secondary | ICD-10-CM | POA: Diagnosis not present

## 2017-06-06 DIAGNOSIS — I10 Essential (primary) hypertension: Secondary | ICD-10-CM | POA: Diagnosis not present

## 2017-06-06 DIAGNOSIS — R739 Hyperglycemia, unspecified: Secondary | ICD-10-CM | POA: Diagnosis not present

## 2017-06-06 DIAGNOSIS — Z0000 Encounter for general adult medical examination without abnormal findings: Secondary | ICD-10-CM | POA: Diagnosis not present

## 2017-07-04 IMAGING — CT CT NECK W/ CM
4 of 5 series · 14 of 33 positions shown, 16 images · IV contrast (iopamidol)
Comparison: None.

CLINICAL DATA: 59 y/o F; burning reflux and cough for the past week
status post thyroidectomy on 09/08/2015. Multiple episodes of
vomiting every day.

EXAM:
CT NECK WITH CONTRAST
TECHNIQUE: Multidetector CT imaging of the neck was performed using the
standard protocol following the bolus administration of intravenous
contrast.
CONTRAST:  75mL V0YPTM-GQQ IOPAMIDOL (V0YPTM-GQQ) INJECTION 61%

[Series 3: neck with st · axial · 0.42mm/px · z∈[+1108,+1246]mm · 4 of 117 slices shown, 5 images]
[im 24/117  soft-tissue]
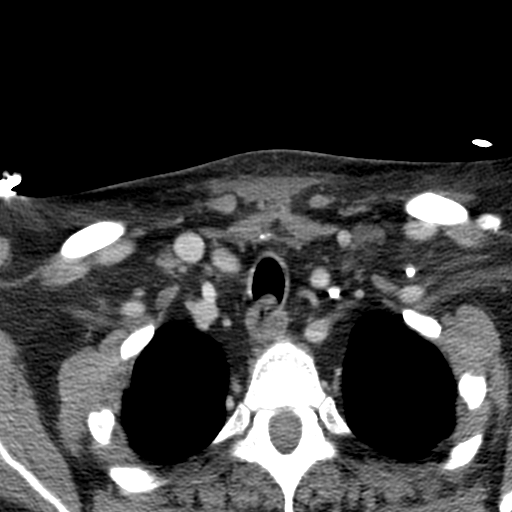
[im 24/117  bone]
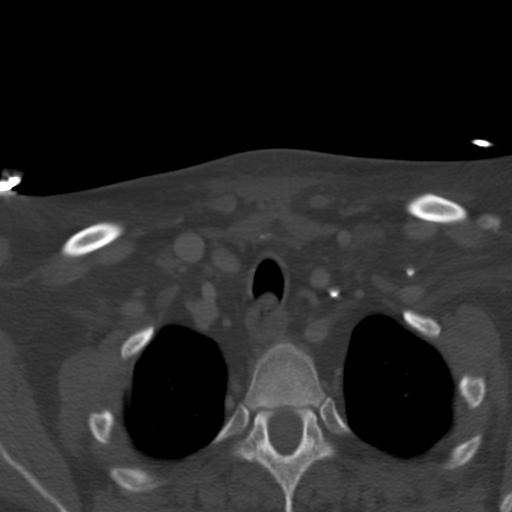
[im 47/117  bone]
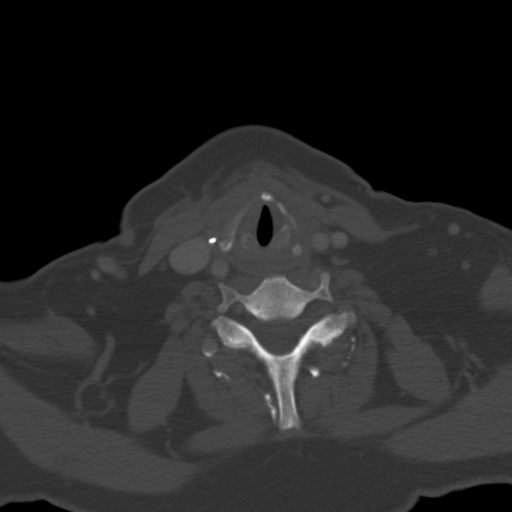
[im 70/117  bone]
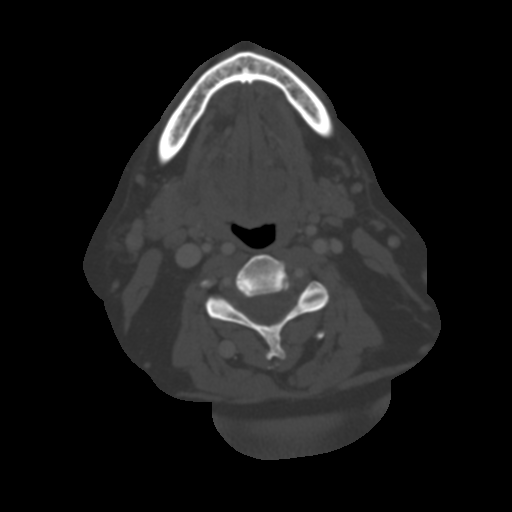
[im 93/117  bone]
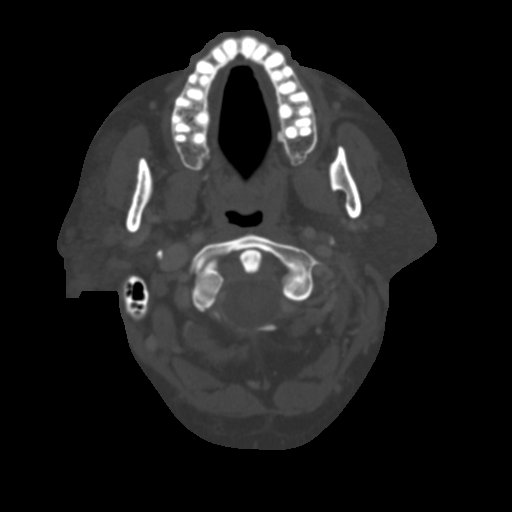

[Series 5: coronal st · coronal · 0.41mm/px · 3 of 84 slices shown]
[im 23/84  bone]
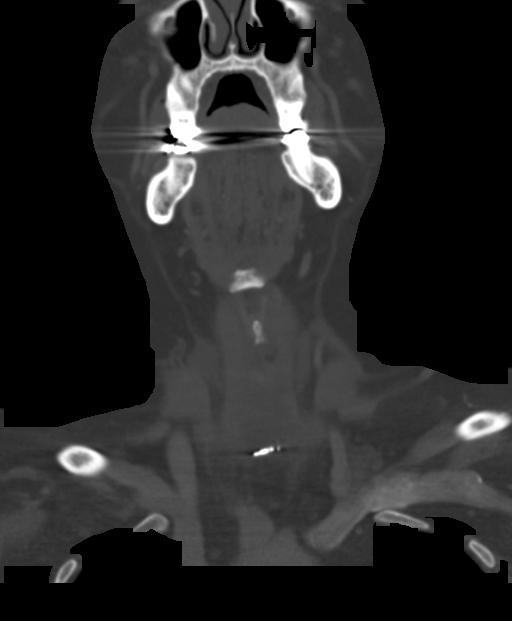
[im 36/84  bone]
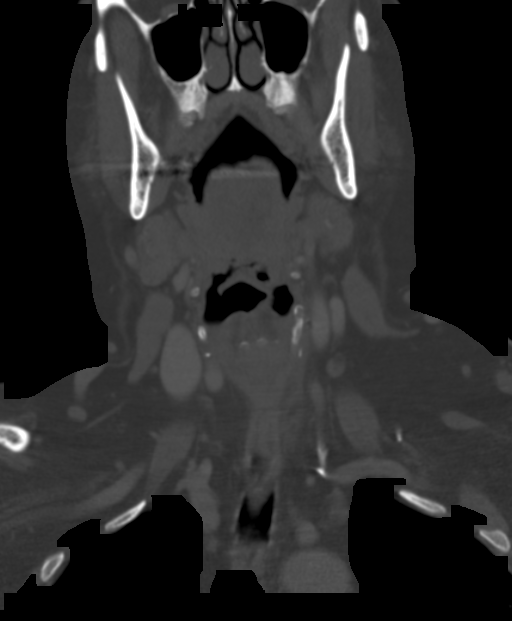
[im 49/84  bone]
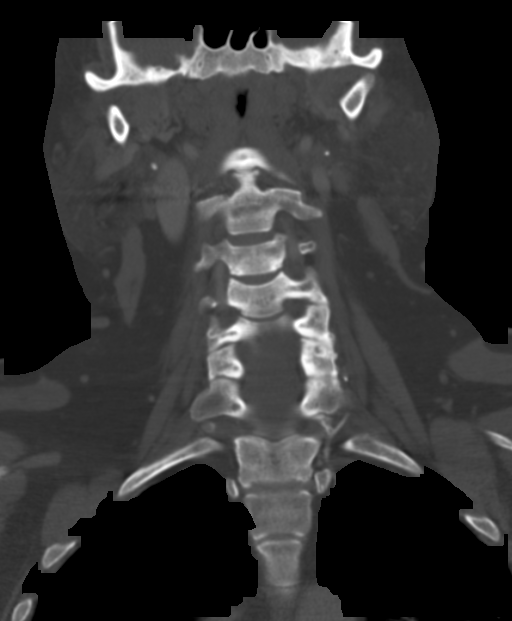

[Series 6: sagittal st · sagittal · 0.37mm/px · 5 of 101 slices shown, 6 images]
[im 34/101  bone]
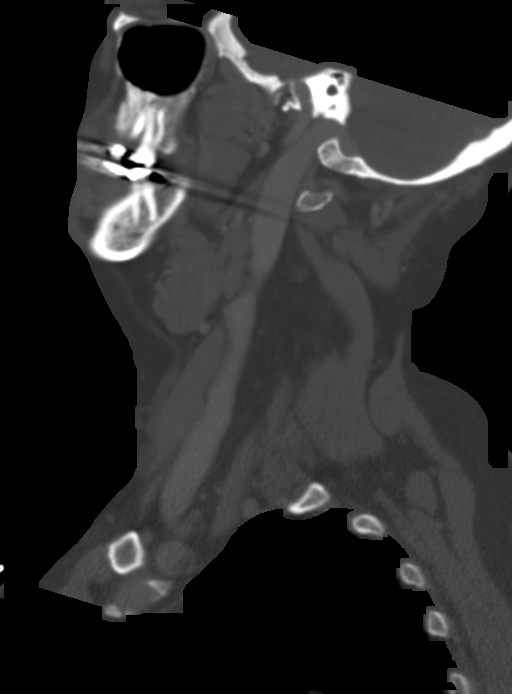
[im 42/101  bone]
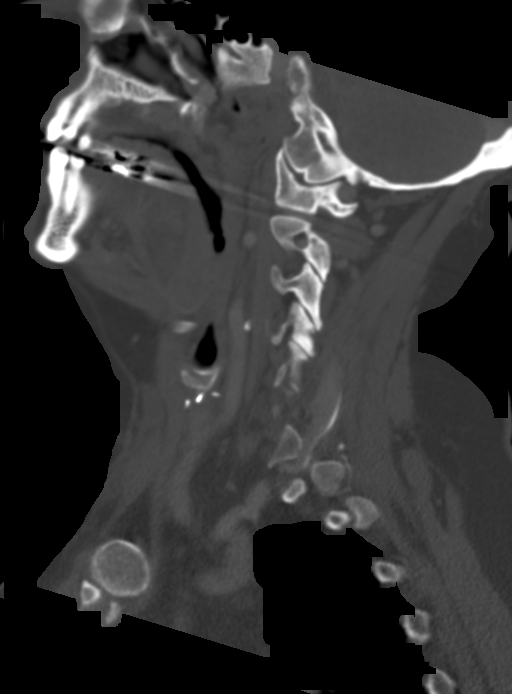
[im 51/101  soft-tissue]
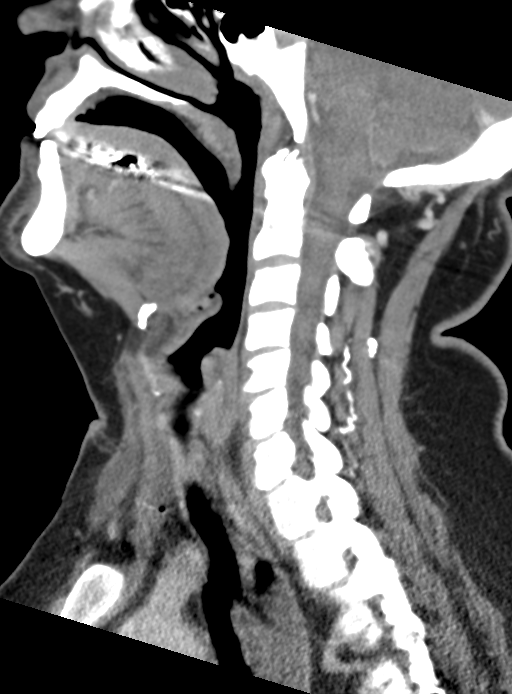
[im 51/101  bone]
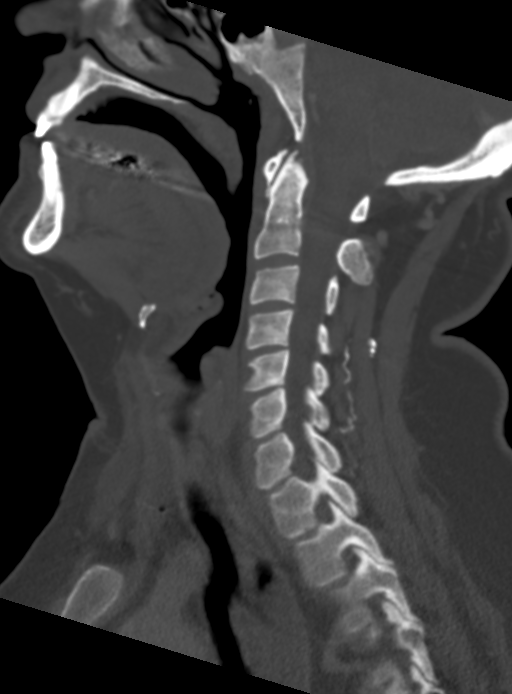
[im 59/101  bone]
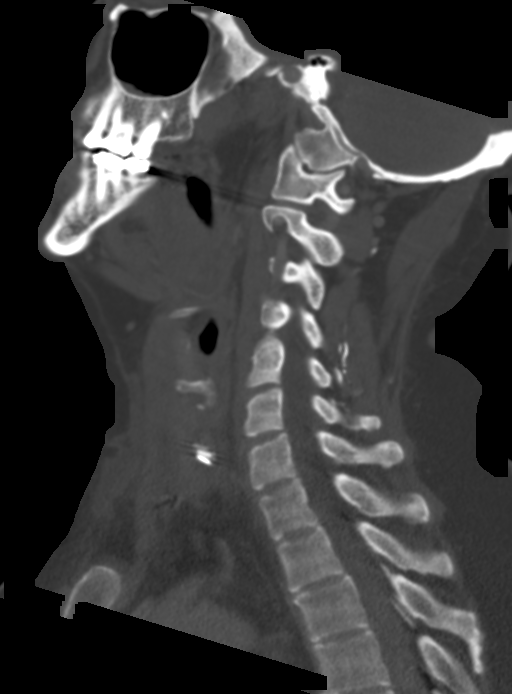
[im 67/101  bone]
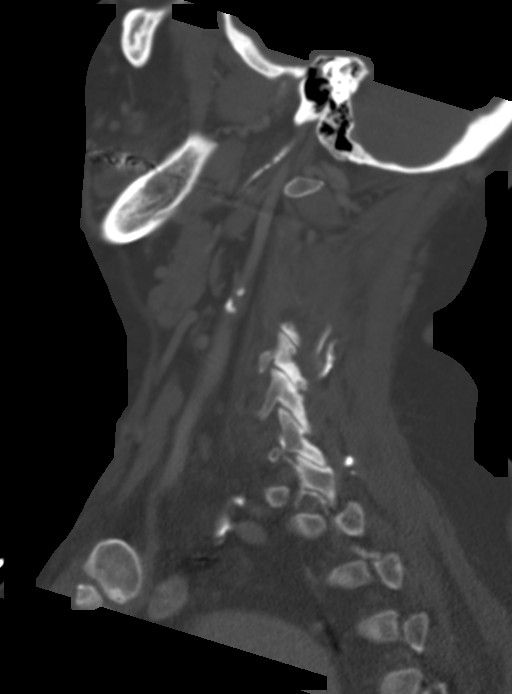

[Series 7: axial recons · axial · 0.34mm/px · z∈[+1082,+1128]mm · 2 of 124 slices shown]
[im 25/124  bone]
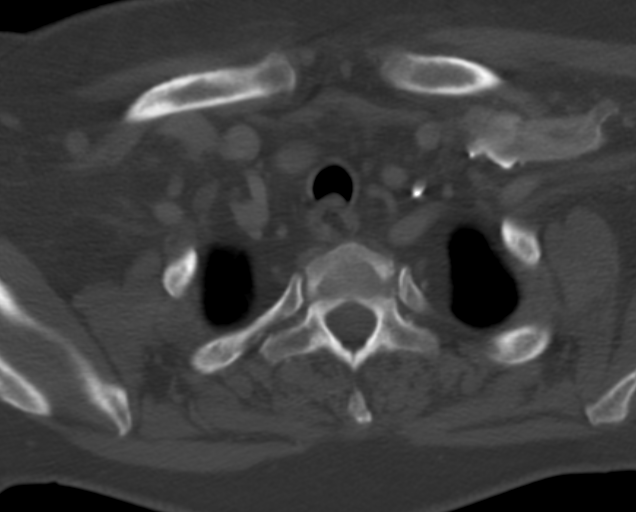
[im 50/124  bone]
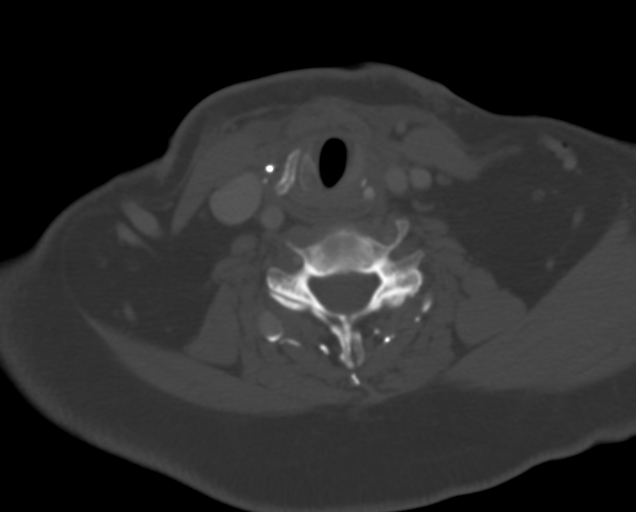

[14 of 33 positions shown; findings below may reference images not displayed]

FINDINGS: Pharynx and larynx: Patent. Debris within the vallecula. No
exophytic lesion is identified. The subglottic airway is normal.

Salivary glands: Normal.

Thyroid: Status post resection with a small fluid collection in the
thyroidectomy bed and thin collection within new the deep
subcutaneous fat measuring 13 x 75 mm axially, series 3, image 87,
probably representing a postoperative seroma. No marginal
enhancement is identified to suggest infection or inflammation.

Lymph nodes: No lymphadenopathy.

Vascular: Carotid and vertebral arteries of the neck are patent.
Mild calcifications of the carotid bifurcations without significant
stenosis. Brief retropharyngeal submucosal course of the proximal
right internal carotid artery. Internal jugular veins are patent.

Limited intracranial: No acute abnormality identified.

Visualized orbits: Unremarkable.

Mastoids and visualized paranasal sinuses: Minimal mucosal
thickening within maxillary sinuses, otherwise normally aerated.

Skeleton: Mild degenerative changes of the cervical spine without
high-grade bony canal stenosis or foraminal narrowing.

Upper chest: Clear lung apices. Markedly dilated and fluid-filled
esophagus downstream to the level of the thyroid gland suggesting a
more distal obstruction.
IMPRESSION: 1. Dilated and fluid-filled esophagus below the level of the thyroid
resection bed suggesting a more downstream obstruction lower in the
mediastinum or abdomen.
2. Nonenhancing fluid collections within the thyroidectomy bed and
subcutaneous fat overlying the resection site probably representing
postoperative seroma.

By: Redmed Munchen M.D.

## 2017-07-14 DIAGNOSIS — E890 Postprocedural hypothyroidism: Secondary | ICD-10-CM | POA: Diagnosis not present

## 2017-07-14 DIAGNOSIS — Z85850 Personal history of malignant neoplasm of thyroid: Secondary | ICD-10-CM | POA: Diagnosis not present

## 2017-07-21 DIAGNOSIS — R5383 Other fatigue: Secondary | ICD-10-CM | POA: Diagnosis not present

## 2017-07-21 DIAGNOSIS — E890 Postprocedural hypothyroidism: Secondary | ICD-10-CM | POA: Diagnosis not present

## 2017-07-21 DIAGNOSIS — Z85850 Personal history of malignant neoplasm of thyroid: Secondary | ICD-10-CM | POA: Diagnosis not present

## 2017-07-21 DIAGNOSIS — R002 Palpitations: Secondary | ICD-10-CM | POA: Diagnosis not present

## 2017-10-12 DIAGNOSIS — E890 Postprocedural hypothyroidism: Secondary | ICD-10-CM | POA: Diagnosis not present

## 2017-10-27 DIAGNOSIS — M67961 Unspecified disorder of synovium and tendon, right lower leg: Secondary | ICD-10-CM | POA: Diagnosis not present

## 2017-10-27 DIAGNOSIS — M79671 Pain in right foot: Secondary | ICD-10-CM | POA: Diagnosis not present

## 2017-10-27 DIAGNOSIS — M67962 Unspecified disorder of synovium and tendon, left lower leg: Secondary | ICD-10-CM | POA: Diagnosis not present

## 2017-10-27 DIAGNOSIS — M79672 Pain in left foot: Secondary | ICD-10-CM | POA: Diagnosis not present

## 2017-12-19 ENCOUNTER — Encounter (HOSPITAL_COMMUNITY): Payer: Self-pay

## 2018-07-24 DIAGNOSIS — Z0000 Encounter for general adult medical examination without abnormal findings: Secondary | ICD-10-CM | POA: Diagnosis not present

## 2018-07-27 DIAGNOSIS — Z1211 Encounter for screening for malignant neoplasm of colon: Secondary | ICD-10-CM | POA: Diagnosis not present

## 2018-07-27 DIAGNOSIS — Z85850 Personal history of malignant neoplasm of thyroid: Secondary | ICD-10-CM | POA: Diagnosis not present

## 2018-07-27 DIAGNOSIS — E890 Postprocedural hypothyroidism: Secondary | ICD-10-CM | POA: Diagnosis not present

## 2018-07-27 DIAGNOSIS — E785 Hyperlipidemia, unspecified: Secondary | ICD-10-CM | POA: Diagnosis not present

## 2018-07-27 DIAGNOSIS — R7303 Prediabetes: Secondary | ICD-10-CM | POA: Diagnosis not present

## 2018-08-06 ENCOUNTER — Other Ambulatory Visit: Payer: Self-pay | Admitting: Family Medicine

## 2018-08-06 DIAGNOSIS — Z1231 Encounter for screening mammogram for malignant neoplasm of breast: Secondary | ICD-10-CM

## 2018-08-14 DIAGNOSIS — Z85850 Personal history of malignant neoplasm of thyroid: Secondary | ICD-10-CM | POA: Diagnosis not present

## 2018-08-14 DIAGNOSIS — E890 Postprocedural hypothyroidism: Secondary | ICD-10-CM | POA: Diagnosis not present

## 2018-09-19 ENCOUNTER — Other Ambulatory Visit: Payer: Self-pay

## 2018-09-19 ENCOUNTER — Ambulatory Visit
Admission: RE | Admit: 2018-09-19 | Discharge: 2018-09-19 | Disposition: A | Discharge status: Home / Self Care | Payer: BLUE CROSS/BLUE SHIELD | Source: Ambulatory Visit | Attending: Family Medicine | Admitting: Family Medicine

## 2018-09-19 DIAGNOSIS — Z1231 Encounter for screening mammogram for malignant neoplasm of breast: Secondary | ICD-10-CM | POA: Diagnosis not present

## 2018-12-18 ENCOUNTER — Other Ambulatory Visit: Payer: Self-pay

## 2018-12-18 DIAGNOSIS — Z20822 Contact with and (suspected) exposure to covid-19: Secondary | ICD-10-CM

## 2018-12-20 LAB — NOVEL CORONAVIRUS, NAA: SARS-CoV-2, NAA: NOT DETECTED

## 2019-01-31 ENCOUNTER — Other Ambulatory Visit: Payer: BLUE CROSS/BLUE SHIELD

## 2019-06-28 DIAGNOSIS — E890 Postprocedural hypothyroidism: Secondary | ICD-10-CM | POA: Diagnosis not present

## 2019-08-09 DIAGNOSIS — Z85850 Personal history of malignant neoplasm of thyroid: Secondary | ICD-10-CM | POA: Diagnosis not present

## 2019-08-09 DIAGNOSIS — E890 Postprocedural hypothyroidism: Secondary | ICD-10-CM | POA: Diagnosis not present

## 2019-08-14 DIAGNOSIS — Z85850 Personal history of malignant neoplasm of thyroid: Secondary | ICD-10-CM | POA: Diagnosis not present

## 2019-08-14 DIAGNOSIS — E890 Postprocedural hypothyroidism: Secondary | ICD-10-CM | POA: Diagnosis not present

## 2019-08-23 DIAGNOSIS — Z0000 Encounter for general adult medical examination without abnormal findings: Secondary | ICD-10-CM | POA: Diagnosis not present

## 2019-09-09 DIAGNOSIS — Z20822 Contact with and (suspected) exposure to covid-19: Secondary | ICD-10-CM | POA: Diagnosis not present

## 2019-10-14 DIAGNOSIS — E890 Postprocedural hypothyroidism: Secondary | ICD-10-CM | POA: Diagnosis not present

## 2019-11-13 ENCOUNTER — Other Ambulatory Visit: Payer: Self-pay | Admitting: Family Medicine

## 2019-11-13 DIAGNOSIS — Z1231 Encounter for screening mammogram for malignant neoplasm of breast: Secondary | ICD-10-CM

## 2019-11-22 DIAGNOSIS — Z23 Encounter for immunization: Secondary | ICD-10-CM | POA: Diagnosis not present

## 2019-11-28 ENCOUNTER — Ambulatory Visit
Admission: RE | Admit: 2019-11-28 | Discharge: 2019-11-28 | Disposition: A | Discharge status: Home / Self Care | Payer: BC Managed Care – PPO | Source: Ambulatory Visit | Attending: Family Medicine | Admitting: Family Medicine

## 2019-11-28 ENCOUNTER — Other Ambulatory Visit: Payer: Self-pay

## 2019-11-28 DIAGNOSIS — Z1231 Encounter for screening mammogram for malignant neoplasm of breast: Secondary | ICD-10-CM

## 2020-02-03 DIAGNOSIS — L309 Dermatitis, unspecified: Secondary | ICD-10-CM | POA: Diagnosis not present

## 2020-02-03 DIAGNOSIS — L821 Other seborrheic keratosis: Secondary | ICD-10-CM | POA: Diagnosis not present

## 2020-02-03 DIAGNOSIS — Z86018 Personal history of other benign neoplasm: Secondary | ICD-10-CM | POA: Diagnosis not present

## 2020-02-03 DIAGNOSIS — D229 Melanocytic nevi, unspecified: Secondary | ICD-10-CM | POA: Diagnosis not present

## 2020-03-10 DIAGNOSIS — D485 Neoplasm of uncertain behavior of skin: Secondary | ICD-10-CM | POA: Diagnosis not present

## 2020-03-10 DIAGNOSIS — D2271 Melanocytic nevi of right lower limb, including hip: Secondary | ICD-10-CM | POA: Diagnosis not present

## 2020-03-10 DIAGNOSIS — D3611 Benign neoplasm of peripheral nerves and autonomic nervous system of face, head, and neck: Secondary | ICD-10-CM | POA: Diagnosis not present

## 2020-06-22 DIAGNOSIS — K5730 Diverticulosis of large intestine without perforation or abscess without bleeding: Secondary | ICD-10-CM | POA: Diagnosis not present

## 2020-06-22 DIAGNOSIS — Z86010 Personal history of colonic polyps: Secondary | ICD-10-CM | POA: Diagnosis not present

## 2020-06-22 DIAGNOSIS — D120 Benign neoplasm of cecum: Secondary | ICD-10-CM | POA: Diagnosis not present

## 2020-06-22 DIAGNOSIS — D123 Benign neoplasm of transverse colon: Secondary | ICD-10-CM | POA: Diagnosis not present

## 2020-08-13 DIAGNOSIS — E890 Postprocedural hypothyroidism: Secondary | ICD-10-CM | POA: Diagnosis not present

## 2020-08-13 DIAGNOSIS — Z85850 Personal history of malignant neoplasm of thyroid: Secondary | ICD-10-CM | POA: Diagnosis not present

## 2020-09-07 DIAGNOSIS — I10 Essential (primary) hypertension: Secondary | ICD-10-CM | POA: Diagnosis not present

## 2020-09-07 DIAGNOSIS — E785 Hyperlipidemia, unspecified: Secondary | ICD-10-CM | POA: Diagnosis not present

## 2020-09-07 DIAGNOSIS — Z0000 Encounter for general adult medical examination without abnormal findings: Secondary | ICD-10-CM | POA: Diagnosis not present

## 2020-09-07 DIAGNOSIS — E538 Deficiency of other specified B group vitamins: Secondary | ICD-10-CM | POA: Diagnosis not present

## 2020-09-07 DIAGNOSIS — R7303 Prediabetes: Secondary | ICD-10-CM | POA: Diagnosis not present

## 2020-09-08 DIAGNOSIS — E890 Postprocedural hypothyroidism: Secondary | ICD-10-CM | POA: Diagnosis not present

## 2020-09-08 DIAGNOSIS — Z85850 Personal history of malignant neoplasm of thyroid: Secondary | ICD-10-CM | POA: Diagnosis not present

## 2020-09-17 DIAGNOSIS — E538 Deficiency of other specified B group vitamins: Secondary | ICD-10-CM | POA: Diagnosis not present

## 2020-09-29 DIAGNOSIS — E538 Deficiency of other specified B group vitamins: Secondary | ICD-10-CM | POA: Diagnosis not present

## 2020-10-06 DIAGNOSIS — E538 Deficiency of other specified B group vitamins: Secondary | ICD-10-CM | POA: Diagnosis not present

## 2020-10-13 DIAGNOSIS — E538 Deficiency of other specified B group vitamins: Secondary | ICD-10-CM | POA: Diagnosis not present

## 2020-10-14 DIAGNOSIS — E538 Deficiency of other specified B group vitamins: Secondary | ICD-10-CM | POA: Diagnosis not present

## 2020-12-02 ENCOUNTER — Other Ambulatory Visit: Payer: Self-pay | Admitting: Family Medicine

## 2020-12-02 DIAGNOSIS — Z1231 Encounter for screening mammogram for malignant neoplasm of breast: Secondary | ICD-10-CM

## 2021-01-05 ENCOUNTER — Ambulatory Visit
Admission: RE | Admit: 2021-01-05 | Discharge: 2021-01-05 | Disposition: A | Discharge status: Home / Self Care | Payer: BC Managed Care – PPO | Source: Ambulatory Visit | Attending: Family Medicine | Admitting: Family Medicine

## 2021-01-05 DIAGNOSIS — Z1231 Encounter for screening mammogram for malignant neoplasm of breast: Secondary | ICD-10-CM

## 2021-03-26 DIAGNOSIS — E890 Postprocedural hypothyroidism: Secondary | ICD-10-CM | POA: Diagnosis not present

## 2021-03-26 DIAGNOSIS — Z85850 Personal history of malignant neoplasm of thyroid: Secondary | ICD-10-CM | POA: Diagnosis not present

## 2021-03-26 DIAGNOSIS — Z8639 Personal history of other endocrine, nutritional and metabolic disease: Secondary | ICD-10-CM | POA: Diagnosis not present

## 2021-07-01 DIAGNOSIS — H43813 Vitreous degeneration, bilateral: Secondary | ICD-10-CM | POA: Diagnosis not present

## 2021-07-01 DIAGNOSIS — H43312 Vitreous membranes and strands, left eye: Secondary | ICD-10-CM | POA: Diagnosis not present

## 2021-07-01 DIAGNOSIS — H538 Other visual disturbances: Secondary | ICD-10-CM | POA: Diagnosis not present

## 2021-07-01 DIAGNOSIS — H2513 Age-related nuclear cataract, bilateral: Secondary | ICD-10-CM | POA: Diagnosis not present

## 2021-07-05 DIAGNOSIS — H2513 Age-related nuclear cataract, bilateral: Secondary | ICD-10-CM | POA: Diagnosis not present

## 2021-07-05 DIAGNOSIS — H25043 Posterior subcapsular polar age-related cataract, bilateral: Secondary | ICD-10-CM | POA: Diagnosis not present

## 2021-07-05 DIAGNOSIS — I10 Essential (primary) hypertension: Secondary | ICD-10-CM | POA: Diagnosis not present

## 2021-07-05 DIAGNOSIS — H25013 Cortical age-related cataract, bilateral: Secondary | ICD-10-CM | POA: Diagnosis not present

## 2021-07-05 DIAGNOSIS — H2511 Age-related nuclear cataract, right eye: Secondary | ICD-10-CM | POA: Diagnosis not present

## 2021-09-15 DIAGNOSIS — E890 Postprocedural hypothyroidism: Secondary | ICD-10-CM | POA: Diagnosis not present

## 2021-09-16 DIAGNOSIS — E890 Postprocedural hypothyroidism: Secondary | ICD-10-CM | POA: Diagnosis not present

## 2021-09-16 DIAGNOSIS — Z85850 Personal history of malignant neoplasm of thyroid: Secondary | ICD-10-CM | POA: Diagnosis not present

## 2021-10-08 DIAGNOSIS — H2513 Age-related nuclear cataract, bilateral: Secondary | ICD-10-CM | POA: Diagnosis not present

## 2021-10-20 DIAGNOSIS — E538 Deficiency of other specified B group vitamins: Secondary | ICD-10-CM | POA: Diagnosis not present

## 2021-10-20 DIAGNOSIS — E785 Hyperlipidemia, unspecified: Secondary | ICD-10-CM | POA: Diagnosis not present

## 2021-10-20 DIAGNOSIS — R002 Palpitations: Secondary | ICD-10-CM | POA: Diagnosis not present

## 2021-10-20 DIAGNOSIS — Z23 Encounter for immunization: Secondary | ICD-10-CM | POA: Diagnosis not present

## 2021-10-20 DIAGNOSIS — Z5181 Encounter for therapeutic drug level monitoring: Secondary | ICD-10-CM | POA: Diagnosis not present

## 2021-10-20 DIAGNOSIS — Z0000 Encounter for general adult medical examination without abnormal findings: Secondary | ICD-10-CM | POA: Diagnosis not present

## 2021-10-20 DIAGNOSIS — I10 Essential (primary) hypertension: Secondary | ICD-10-CM | POA: Diagnosis not present

## 2021-10-20 DIAGNOSIS — E119 Type 2 diabetes mellitus without complications: Secondary | ICD-10-CM | POA: Diagnosis not present

## 2021-10-21 DIAGNOSIS — H2511 Age-related nuclear cataract, right eye: Secondary | ICD-10-CM | POA: Diagnosis not present

## 2021-10-22 DIAGNOSIS — H2512 Age-related nuclear cataract, left eye: Secondary | ICD-10-CM | POA: Diagnosis not present

## 2021-10-22 DIAGNOSIS — H25042 Posterior subcapsular polar age-related cataract, left eye: Secondary | ICD-10-CM | POA: Diagnosis not present

## 2021-10-22 DIAGNOSIS — H25012 Cortical age-related cataract, left eye: Secondary | ICD-10-CM | POA: Diagnosis not present

## 2021-10-25 ENCOUNTER — Other Ambulatory Visit: Payer: Self-pay | Admitting: Family Medicine

## 2021-10-25 DIAGNOSIS — R1900 Intra-abdominal and pelvic swelling, mass and lump, unspecified site: Secondary | ICD-10-CM

## 2021-10-28 ENCOUNTER — Ambulatory Visit
Admission: RE | Admit: 2021-10-28 | Discharge: 2021-10-28 | Disposition: A | Discharge status: Home / Self Care | Payer: BC Managed Care – PPO | Source: Ambulatory Visit | Attending: Family Medicine | Admitting: Family Medicine

## 2021-10-28 DIAGNOSIS — R1900 Intra-abdominal and pelvic swelling, mass and lump, unspecified site: Secondary | ICD-10-CM

## 2021-10-28 DIAGNOSIS — Z90710 Acquired absence of both cervix and uterus: Secondary | ICD-10-CM | POA: Diagnosis not present

## 2021-11-04 DIAGNOSIS — H2512 Age-related nuclear cataract, left eye: Secondary | ICD-10-CM | POA: Diagnosis not present

## 2021-11-08 DIAGNOSIS — E890 Postprocedural hypothyroidism: Secondary | ICD-10-CM | POA: Diagnosis not present

## 2021-11-08 DIAGNOSIS — Z85850 Personal history of malignant neoplasm of thyroid: Secondary | ICD-10-CM | POA: Diagnosis not present

## 2021-11-12 ENCOUNTER — Other Ambulatory Visit: Payer: Self-pay | Admitting: Family Medicine

## 2021-11-12 DIAGNOSIS — R9389 Abnormal findings on diagnostic imaging of other specified body structures: Secondary | ICD-10-CM

## 2021-11-12 DIAGNOSIS — N3289 Other specified disorders of bladder: Secondary | ICD-10-CM

## 2021-11-19 DIAGNOSIS — E119 Type 2 diabetes mellitus without complications: Secondary | ICD-10-CM | POA: Diagnosis not present

## 2021-11-23 ENCOUNTER — Other Ambulatory Visit: Payer: Self-pay | Admitting: Family Medicine

## 2021-11-23 DIAGNOSIS — Z1231 Encounter for screening mammogram for malignant neoplasm of breast: Secondary | ICD-10-CM

## 2021-11-24 ENCOUNTER — Other Ambulatory Visit: Payer: Self-pay | Admitting: Internal Medicine

## 2021-11-24 DIAGNOSIS — C73 Malignant neoplasm of thyroid gland: Secondary | ICD-10-CM

## 2021-11-26 ENCOUNTER — Ambulatory Visit
Admission: RE | Admit: 2021-11-26 | Discharge: 2021-11-26 | Disposition: A | Discharge status: Home / Self Care | Payer: BC Managed Care – PPO | Source: Ambulatory Visit | Attending: Internal Medicine | Admitting: Internal Medicine

## 2021-11-26 DIAGNOSIS — C73 Malignant neoplasm of thyroid gland: Secondary | ICD-10-CM

## 2021-11-26 DIAGNOSIS — Z85850 Personal history of malignant neoplasm of thyroid: Secondary | ICD-10-CM | POA: Diagnosis not present

## 2021-11-28 NOTE — Progress Notes (Unsigned)
Cardiology Office Note:    Date:  11/28/2021   ID:  Lauren [name], DOB July 27, 1955, MRN [id number]  PCP:  Maurice Small, MD   [place] Providers Cardiologist:  None   Referring MD: Maurice Small, MD    History of Present Illness:    Lauren [name] is a 66 y.o. female with a hx of HTN, HLD, and ITP who was referred by Dr. Justin Mend for further evaluation of palpitations.  Was seen by Dr. Justin Mend on 10/27/21. Note reviewed. She reports long history of palpitations that are most notable at rest as well as dyspnea on exertion. She requested to see Cardiology for further evaluation.  Today, ***  Past Medical History:  Diagnosis Date   Hyperlipidemia    Hypertension    Idiopathic thrombocytopenic purpura (ITP) ([place]) 1987   not current problem, no hematologist    Past Surgical History:  Procedure Laterality Date   ABDOMINAL HYSTERECTOMY  11-18-1998   partial   CHOLECYSTECTOMY  08/02/2011   Procedure: LAPAROSCOPIC CHOLECYSTECTOMY WITH INTRAOPERATIVE CHOLANGIOGRAM;  Surgeon: Pedro Earls, MD;  Location: [facility];  Service: General;  Laterality: N/A;   LAPAROSCOPIC GASTRIC BANDING  05/19/2008   LUMBAR [place] SURGERY  03/21/1996   THYROIDECTOMY N/A 09/08/2015   Procedure: TOTAL THYROIDECTOMY;  Surgeon: Armandina Gemma, MD;  Location: [facility];  Service: General;  Laterality: N/A;   TONSILLECTOMY      Current Medications: No outpatient medications have been marked as taking for the 12/01/21 encounter (Appointment) with Freada Bergeron, MD.     Allergies:   Patient has no known allergies.   Social History   Socioeconomic History   Marital status: Married    Spouse name: Not on file   Number of children: Not on file   Years of education: Not on file   Highest education level: Not on file  Occupational History   Not on file  Tobacco Use   Smoking status: Never   Smokeless tobacco: Never  Substance and Sexual Activity   Alcohol use: No   Drug use: No   Sexual activity:  Yes  Other Topics Concern   Not on file  Social History Narrative   Not on file   Social Determinants of Health   Financial Resource Strain: Not on file  Food Insecurity: Not on file  Transportation Needs: Not on file  Physical Activity: Not on file  Stress: Not on file  Social Connections: Not on file     Family History: The patient's ***family history includes Breast cancer in her maternal aunt; Cancer in her maternal aunt; Colon polyps in her mother; Diabetes in her father; Heart disease in her father and mother.  ROS:   Please see the history of present illness.    *** All other systems reviewed and are negative.  EKGs/Labs/Other Studies Reviewed:    The following studies were reviewed today: ***  EKG:  EKG is *** ordered today.  The ekg ordered today demonstrates ***  Recent Labs: No results found for requested labs within last 365 days.  Recent Lipid Panel No results found for: "CHOL", "TRIG", "HDL", "CHOLHDL", "VLDL", "LDLCALC", "LDLDIRECT"   Risk Assessment/Calculations:   {Does this patient have ATRIAL FIBRILLATION?[phone]}  No BP recorded.  {Refresh Note OR Click here to enter BP  :1}***         Physical Exam:    VS:  There were no vitals taken for this visit.    Wt Readings from Last 3 Encounters:  10/05/15 232 lb (105.2 kg)  09/08/15 232 lb (105.2 kg)  09/04/15 232 lb (105.2 kg)     GEN: *** Well nourished, well developed in no acute distress HEENT: Normal NECK: No JVD; No carotid bruits LYMPHATICS: No lymphadenopathy CARDIAC: ***RRR, no murmurs, rubs, gallops RESPIRATORY:  Clear to auscultation without rales, wheezing or rhonchi  ABDOMEN: Soft, non-tender, non-distended MUSCULOSKELETAL:  No edema; No deformity  SKIN: Warm and dry NEUROLOGIC:  Alert and oriented x 3 PSYCHIATRIC:  Normal affect   ASSESSMENT:    No diagnosis found. PLAN:    In order of problems listed above:  #Palpitations: Has been ongoing for several ***. Worse  at rest. No associated lightheadedness, chest pain or syncope. Will check zio monitor for further evaluation. -Check zio -? TTE  #HTN: -Continue benzapril-HCTZ 20-12.'5mg'$  daily -Continue metop 12.'5mg'$  XL daily  #HLD: -Continue simvastatin '20mg'$  daily -LDL 98 -? Ca score      {Are you ordering a CV Procedure (e.g. stress test, cath, DCCV, TEE, etc)?   Press F2        :[id number]}    Medication Adjustments/Labs and Tests Ordered: Current medicines are reviewed at length with the patient today.  Concerns regarding medicines are outlined above.  No orders of the defined types were placed in this encounter.  No orders of the defined types were placed in this encounter.   There are no Patient Instructions on file for this visit.   Signed, Freada Bergeron, MD  11/28/2021 2:36 PM    [place]

## 2021-11-29 ENCOUNTER — Ambulatory Visit
Admission: RE | Admit: 2021-11-29 | Discharge: 2021-11-29 | Disposition: A | Discharge status: Home / Self Care | Payer: BC Managed Care – PPO | Source: Ambulatory Visit | Attending: Family Medicine | Admitting: Family Medicine

## 2021-11-29 DIAGNOSIS — R918 Other nonspecific abnormal finding of lung field: Secondary | ICD-10-CM | POA: Diagnosis not present

## 2021-11-29 DIAGNOSIS — I700 Atherosclerosis of aorta: Secondary | ICD-10-CM | POA: Diagnosis not present

## 2021-11-29 DIAGNOSIS — R9389 Abnormal findings on diagnostic imaging of other specified body structures: Secondary | ICD-10-CM

## 2021-11-29 DIAGNOSIS — Z9049 Acquired absence of other specified parts of digestive tract: Secondary | ICD-10-CM | POA: Diagnosis not present

## 2021-11-29 DIAGNOSIS — K5730 Diverticulosis of large intestine without perforation or abscess without bleeding: Secondary | ICD-10-CM | POA: Diagnosis not present

## 2021-11-29 DIAGNOSIS — N3289 Other specified disorders of bladder: Secondary | ICD-10-CM

## 2021-11-29 MED ORDER — IOPAMIDOL (ISOVUE-300) INJECTION 61%
100.0000 mL | Freq: Once | INTRAVENOUS | Status: AC | PRN
  Administered 2021-11-29: 100 mL via INTRAVENOUS

## 2021-11-30 ENCOUNTER — Other Ambulatory Visit: Payer: Self-pay | Admitting: Internal Medicine

## 2021-11-30 DIAGNOSIS — C73 Malignant neoplasm of thyroid gland: Secondary | ICD-10-CM | POA: Diagnosis not present

## 2021-11-30 DIAGNOSIS — R682 Dry mouth, unspecified: Secondary | ICD-10-CM | POA: Diagnosis not present

## 2021-11-30 DIAGNOSIS — E890 Postprocedural hypothyroidism: Secondary | ICD-10-CM | POA: Diagnosis not present

## 2021-11-30 DIAGNOSIS — R9389 Abnormal findings on diagnostic imaging of other specified body structures: Secondary | ICD-10-CM | POA: Diagnosis not present

## 2021-12-01 ENCOUNTER — Ambulatory Visit: Payer: BC Managed Care – PPO | Attending: Cardiology | Admitting: Cardiology

## 2021-12-01 ENCOUNTER — Encounter: Payer: Self-pay | Admitting: Cardiology

## 2021-12-01 ENCOUNTER — Telehealth: Payer: Self-pay | Admitting: *Deleted

## 2021-12-01 ENCOUNTER — Ambulatory Visit: Payer: BC Managed Care – PPO | Attending: Cardiology

## 2021-12-01 VITALS — BP 150/98 | HR 79 | Ht 66.0 in | Wt 223.2 lb

## 2021-12-01 DIAGNOSIS — R002 Palpitations: Secondary | ICD-10-CM

## 2021-12-01 DIAGNOSIS — R0602 Shortness of breath: Secondary | ICD-10-CM

## 2021-12-01 DIAGNOSIS — Z6836 Body mass index (BMI) 36.0-36.9, adult: Secondary | ICD-10-CM

## 2021-12-01 DIAGNOSIS — Z79899 Other long term (current) drug therapy: Secondary | ICD-10-CM | POA: Diagnosis not present

## 2021-12-01 DIAGNOSIS — E785 Hyperlipidemia, unspecified: Secondary | ICD-10-CM

## 2021-12-01 DIAGNOSIS — E119 Type 2 diabetes mellitus without complications: Secondary | ICD-10-CM

## 2021-12-01 DIAGNOSIS — I700 Atherosclerosis of aorta: Secondary | ICD-10-CM

## 2021-12-01 DIAGNOSIS — I10 Essential (primary) hypertension: Secondary | ICD-10-CM

## 2021-12-01 MED ORDER — ROSUVASTATIN CALCIUM 20 MG PO TABS: 20.0000 mg | ORAL_TABLET | Freq: Every day | ORAL | 2 refills | Status: DC

## 2021-12-01 MED ORDER — CARVEDILOL 12.5 MG PO TABS: 12.5000 mg | ORAL_TABLET | Freq: Two times a day (BID) | ORAL | 2 refills | Status: DC

## 2021-12-01 NOTE — Progress Notes (Unsigned)
Enrolled for [employer] to mail a ZIO XT long term holter monitor to the patients address on file.  

## 2021-12-01 NOTE — Patient Instructions (Signed)
Medication Instructions:   STOP TAKING METOPROLOL NOW  STOP TAKING SIMVASTATIN NOW  START TAKING CARVEDILOL (COREG) 12.5 MG BY MOUTH TWICE DAILY  START TAKING ROSUVASTATIN (CRESTOR) 20 MG BY MOUTH DAILY  *If you need a refill on your cardiac medications before your next appointment, please call your pharmacy*   Lab Work:  IN 8 WEEKS HERE IN THE OFFICE--CHECK LIPIDS AT THAT TIME--PLEASE COME FASTING TO THIS LAB APPOINTMENT  If you have labs (blood work) drawn today and your tests are completely normal, you will receive your results only by: [place] (if you have MyChart) OR A paper copy in the mail If you have any lab test that is abnormal or we need to change your treatment, we will call you to review the results.   Testing/Procedures:  Your physician has requested that you have an echocardiogram. Echocardiography is a painless test that uses sound waves to create images of your heart. It provides your doctor with information about the size and shape of your heart and how well your heart's chambers and valves are working. This procedure takes approximately one hour. There are no restrictions for this procedure. Please do NOT wear cologne, perfume, aftershave, or lotions (deodorant is allowed). Please arrive 15 minutes prior to your appointment time.    ZIO XT- Long Term Monitor Instructions  Your physician has requested you wear a ZIO patch monitor for 14 days.  This is a single patch monitor. [employer] supplies one patch monitor per enrollment. Additional stickers are not available. Please do not apply patch if you will be having a Nuclear Stress Test,  Echocardiogram, Cardiac CT, MRI, or Chest Xray during the period you would be wearing the  monitor. The patch cannot be worn during these tests. You cannot remove and re-apply the  ZIO XT patch monitor.  Your ZIO patch monitor will be mailed 3 day USPS to your address on file. It may take 3-5 days  to receive your monitor  after you have been enrolled.  Once you have received your monitor, please review the enclosed instructions. Your monitor  has already been registered assigning a specific monitor serial # to you.  Billing and Patient Assistance Program Information  We have supplied [employer] with any of your insurance information on file for billing purposes. [employer] offers a sliding scale Patient Assistance Program for patients that do not have  insurance, or whose insurance does not completely cover the cost of the ZIO monitor.  You must apply for the Patient Assistance Program to qualify for this discounted rate.  To apply, please call [employer] at [phone], select option 4, select option 2, ask to apply for  Patient Assistance Program. Theodore Demark will ask your household income, and how many people  are in your household. They will quote your out-of-pocket cost based on that information.  [employer] will also be able to set up a 51-month interest-free payment plan if needed.  Applying the monitor   Shave hair from upper left chest.  Hold abrader disc by orange tab. Rub abrader in 40 strokes over the upper left chest as  indicated in your monitor instructions.  Clean area with 4 enclosed alcohol pads. Let dry.  Apply patch as indicated in monitor instructions. Patch will be placed under collarbone on left  side of chest with arrow pointing upward.  Rub patch adhesive wings for 2 minutes. Remove white label marked "1". Remove the white  label marked "2". Rub patch adhesive wings for 2 additional minutes.  While looking in a mirror, press and release button in center of patch. A small green light will  flash 3-4 times. This will be your only indicator that the monitor has been turned on.  Do not shower for the first 24 hours. You may shower after the first 24 hours.  Press the button if you feel a symptom. You will hear a small click. Record Date, Time and  Symptom in the Patient Logbook.  When you are  ready to remove the patch, follow instructions on the last 2 pages of Patient  Logbook. Stick patch monitor onto the last page of Patient Logbook.  Place Patient Logbook in the blue and white box. Use locking tab on box and tape box closed  securely. The blue and white box has prepaid postage on it. Please place it in the mailbox as  soon as possible. Your physician should have your test results approximately 7 days after the  monitor has been mailed back to [facility].  Call [place] at [phone] if you have questions regarding  your ZIO XT patch monitor. Call them immediately if you see an orange light blinking on your  monitor.  If your monitor falls off in less than 4 days, contact our Monitor department at [phone].  If your monitor becomes loose or falls off after 4 days call [employer] at [phone] for  suggestions on securing your monitor    Follow-Up: At [facility], you and your health needs are our priority.  As part of our continuing mission to provide you with exceptional heart care, we have created designated Provider Care Teams.  These Care Teams include your primary Cardiologist (physician) and Advanced Practice Providers (APPs -  Physician Assistants and Nurse Practitioners) who all work together to provide you with the care you need, when you need it.  We recommend signing up for the patient portal called "MyChart".  Sign up information is provided on this After Visit Summary.  MyChart is used to connect with patients for Virtual Visits (Telemedicine).  Patients are able to view lab/test results, encounter notes, upcoming appointments, etc.  Non-urgent messages can be sent to your provider as well.   To learn more about what you can do with MyChart, go to [email or web address].    Your next appointment:   6 month(s)  The format for your next appointment:   In Person  Provider:   Robbie Lis, PA-C, Nicholes Rough, PA-C, Melina Copa, PA-C, Ambrose Pancoast, NP, Cecilie Kicks, NP, Ermalinda Barrios, PA-C, Christen Bame, NP, or Richardson Dopp, PA-C          Important Information About Sugar

## 2021-12-01 NOTE — Telephone Encounter (Signed)
-----   Message from Jennefer Bravo sent at 12/01/2021  1:12 PM EDT ----- Regarding: RE: 2 WEEK ZIO PER DR. Johney Frame done ----- Message ----- From: Nuala Alpha, LPN Sent: 38/11/1749  10:36 AM EDT To: Nuala Alpha, LPN; Shelly A Wells Subject: 2 WEEK ZIO PER DR. Johney Frame                   Dr. Johney Frame ordered a 2 week zio on this pt for palpitations.  Please enroll and let me know when you do.  Thanks [employer]

## 2021-12-01 NOTE — Progress Notes (Signed)
Cardiology Office Note:    Date:  12/01/2021   ID:  Lauren [name], DOB March 18, 1955, MRN [id number]  PCP:  Maurice Small, MD   [place] Providers Cardiologist:  None   Referring MD: Maurice Small, MD    History of Present Illness:    Lauren [name] is a 66 y.o. female with a hx of HTN, HLD, and ITP who was referred by Dr. Justin Mend for further evaluation of palpitations.  Was seen by Dr. Justin Mend on 10/27/21. Note reviewed. She reports long history of palpitations that are most notable at rest as well as dyspnea on exertion. She requested to see Cardiology for further evaluation.  Today, the patient states that she was having palpitations very often, initially a couple times daily. Recently they have been less frequent, about 3 times in the last 2 weeks. Most of the time they occur randomly while she is sitting at her desk. They feel rapid; her smart watch has detected heart rates as high as 112 bpm but more often in the 90s. She confirms taking 1 tablet of 25 mg metoprolol daily. All of her medications are taken in the mornings aside from simvastatin which is nightly.  Although her blood pressure was elevated at her visit with Dr. Justin Mend and at today's visit (142/90; On recheck 150/98), she states that overall her blood pressure has normalized to 120s/70s at home. She uses both a wrist and arm cuff at home. Her arm cuff does not always fit properly. Previously she noted high blood pressures with headaches. She denies any headaches within the past 2-3 weeks.  At last year's physical she was 242 lbs. Today she is 223 lbs. She attributes half of this weight loss to starting metformin about a month ago. She continues to work on weight loss, but she is limited by ankle, knee, and hip pain while walking. Also she will be mildly short of breath with exertion but feels more limited by her arthralgias. If she is able to shop while supporting herself with a cart she does better.   She denies any  chest pain, or peripheral edema. No lightheadedness, syncope, orthopnea, or PND.  In her family, her father had carotid artery disease and an aortic aneurysm; he was a known smoker. Her brother has Afib.  Of note, she reports detection of recurring thyroid cancer. A biopsy will be performed soon.   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   not current problem, no hematologist    Past Surgical History:  Procedure Laterality Date   ABDOMINAL HYSTERECTOMY  11-18-1998   partial   CHOLECYSTECTOMY  08/02/2011   Procedure: LAPAROSCOPIC CHOLECYSTECTOMY WITH INTRAOPERATIVE CHOLANGIOGRAM;  Surgeon: Pedro Earls, MD;  Location: [facility];  Service: General;  Laterality: N/A;   LAPAROSCOPIC GASTRIC BANDING  05/19/2008   LUMBAR [place] SURGERY  03/21/1996   THYROIDECTOMY N/A 09/08/2015   Procedure: TOTAL THYROIDECTOMY;  Surgeon: Armandina Gemma, MD;  Location: [facility];  Service: General;  Laterality: N/A;   TONSILLECTOMY      Current Medications: Current Meds  Medication Sig   aspirin 81 MG tablet Take 81 mg by mouth daily.    benazepril-hydrochlorthiazide (LOTENSIN HCT) 20-12.5 MG per tablet Take 1 tablet by mouth daily.    carvedilol (COREG) 12.5 MG tablet Take 1 tablet (12.5 mg total) by mouth 2 (two) times daily.   docusate sodium (COLACE) 100 MG capsule Take  100 mg by mouth daily.   levothyroxine (SYNTHROID) 150 MCG tablet Take 150 mcg by mouth every morning.   metFORMIN (GLUCOPHAGE) 500 MG tablet 1 Tablet(s) By Mouth Every Evening   prednisoLONE acetate (PRED FORTE) 1 % ophthalmic suspension Place 1 drop into the left eye daily in the afternoon.   rosuvastatin (CRESTOR) 20 MG tablet Take 1 tablet (20 mg total) by mouth daily.   [DISCONTINUED] metoprolol succinate (TOPROL-XL) 25 MG 24 hr tablet Take 12.5 mg by mouth daily.   [DISCONTINUED] simvastatin (ZOCOR) 20 MG tablet Take 20 mg by mouth every evening.      Allergies:   Patient has no known allergies.   Social History   Socioeconomic History   Marital status: Married    Spouse name: Not on file   Number of children: Not on file   Years of education: Not on file   Highest education level: Not on file  Occupational History   Not on file  Tobacco Use   Smoking status: Never   Smokeless tobacco: Never  Substance and Sexual Activity   Alcohol use: No   Drug use: No   Sexual activity: Yes  Other Topics Concern   Not on file  Social History Narrative   Not on file   Social Determinants of Health   Financial Resource Strain: Not on file  Food Insecurity: Not on file  Transportation Needs: Not on file  Physical Activity: Not on file  Stress: Not on file  Social Connections: Not on file     Family History: The patient's family history includes Breast cancer in her maternal aunt; Cancer in her maternal aunt; Colon polyps in her mother; Diabetes in her father; Heart disease in her father and mother.  ROS:   Review of Systems  Constitutional:  Negative for chills and fever.  HENT:  Negative for nosebleeds and tinnitus.   Eyes:  Negative for blurred vision and pain.  Respiratory:  Positive for shortness of breath. Negative for cough, hemoptysis and stridor.   Cardiovascular:  Positive for palpitations. Negative for chest pain, orthopnea, claudication, leg swelling and PND.  Gastrointestinal:  Negative for blood in stool, diarrhea, nausea and vomiting.  Genitourinary:  Negative for dysuria and hematuria.  Musculoskeletal:  Positive for joint pain (Ankles, knees, hips). Negative for falls.  Neurological:  Negative for dizziness, loss of consciousness and headaches.  Psychiatric/Behavioral:  Negative for depression, hallucinations and substance abuse. The patient does not have insomnia.      EKGs/Labs/Other Studies Reviewed:    The following studies were reviewed today:  No prior cardiovascular studies available.  EKG:  EKG  is personally reviewed. 12/01/2021: Sinus rhythm. Rate 79 bpm.   Recent Labs: No results found for requested labs within last 365 days.   Recent Lipid Panel No results found for: "CHOL", "TRIG", "HDL", "CHOLHDL", "VLDL", "LDLCALC", "LDLDIRECT"   Risk Assessment/Calculations:      HYPERTENSION CONTROL Vitals:   12/01/21 0944 12/01/21 1020  BP: (!) 142/90 (!) 150/98    The patient's blood pressure is elevated above target today.  In order to address the patient's elevated BP: A new medication was prescribed today.            Physical Exam:    VS:  BP (!) 150/98 (BP Location: Left Arm, Patient Position: Sitting, Cuff Size: Large)   Pulse 79   Ht '5\' 6"'$  (1.676 m)   Wt 223 lb 3.2 oz (101.2 kg)   SpO2 97%   BMI 36.03  kg/m     Wt Readings from Last 3 Encounters:  12/01/21 223 lb 3.2 oz (101.2 kg)  10/05/15 232 lb (105.2 kg)  09/08/15 232 lb (105.2 kg)     GEN: Well nourished, well developed in no acute distress HEENT: Normal NECK: No JVD; No carotid bruits LYMPHATICS: No lymphadenopathy CARDIAC: RRR, no murmurs, rubs, gallops RESPIRATORY:  Clear to auscultation without rales, wheezing or rhonchi  ABDOMEN: Soft, non-tender, non-distended MUSCULOSKELETAL:  No edema; No deformity  SKIN: Warm and dry NEUROLOGIC:  Alert and oriented x 3 PSYCHIATRIC:  Normal affect   ASSESSMENT:    1. Palpitations   2. Medication management   3. Hyperlipidemia, unspecified hyperlipidemia type   4. SOB (shortness of breath)   5. Class 2 severe obesity due to excess calories with serious comorbidity and body mass index (BMI) of 36.0 to 36.9 in adult ([place])   6. Diabetes mellitus with coincident hypertension ([place])   7. Aortic atherosclerosis ([place])   8. Primary hypertension    PLAN:    In order of problems listed above:  #Palpitations: Has improved since her prior visit with Dr. Justin Mend. Reports intermittent episodes of her heart racing while at rest. No associated chest pain,  lightheadedness or SOB. Will check zio monitor for further evaluation.  -Check 2 week zio  #DOE: Notes dyspnea on exertion with occasional LE edema that has been ongoing. No exertional chest pain. Given risk factors, will check TTE. Can consider stress testing in the future pending symptoms and echo findings.  -Check TTE  #HTN: Elevated on multiple checks here but states it has been better controlled at PCP office. Will change metop to coreg and monitor response. -Continue benzapril-HCTZ 20-12.'5mg'$  daily -Change metop to coreg 12.'5mg'$  BID  #HLD: #Aortic Atherosclerosis: -Change simva to crestor '20mg'$  daily -Goal LDL<70; currently 98 -Declined Ca score as she is having a CT chest next week -Continue lifestyle modifications as below -Repeat lipids in 6-8 weeks  #Obesity: BMI 36. Working on lifestyle modifications. Declined GLP-1.  #DMII: Recently diagnosed. A1C 8.7. Has been started on metformin. -Management per PCP -Declined ozempic for now; may consider in the future   Follow-up:  6 months.  Medication Adjustments/Labs and Tests Ordered: Current medicines are reviewed at length with the patient today.  Concerns regarding medicines are outlined above.   Orders Placed This Encounter  Procedures   Lipid Profile   LONG TERM MONITOR (3-14 DAYS)   EKG 12-Lead   ECHOCARDIOGRAM COMPLETE   Meds ordered this encounter  Medications   rosuvastatin (CRESTOR) 20 MG tablet    Sig: Take 1 tablet (20 mg total) by mouth daily.    Dispense:  90 tablet    Refill:  2    Stopped simvastatin and switched to this.   carvedilol (COREG) 12.5 MG tablet    Sig: Take 1 tablet (12.5 mg total) by mouth 2 (two) times daily.    Dispense:  180 tablet    Refill:  2    Stopped metoprolol and switched to this.   Patient Instructions  Medication Instructions:   STOP TAKING METOPROLOL NOW  STOP TAKING SIMVASTATIN NOW  START TAKING CARVEDILOL (COREG) 12.5 MG BY MOUTH TWICE DAILY  START TAKING  ROSUVASTATIN (CRESTOR) 20 MG BY MOUTH DAILY  *If you need a refill on your cardiac medications before your next appointment, please call your pharmacy*   Lab Work:  IN 8 WEEKS HERE IN THE OFFICE--CHECK LIPIDS AT THAT TIME--PLEASE COME FASTING TO THIS LAB APPOINTMENT  If you have labs (blood work) drawn today and your tests are completely normal, you will receive your results only by: [place] (if you have MyChart) OR A paper copy in the mail If you have any lab test that is abnormal or we need to change your treatment, we will call you to review the results.   Testing/Procedures:  Your physician has requested that you have an echocardiogram. Echocardiography is a painless test that uses sound waves to create images of your heart. It provides your doctor with information about the size and shape of your heart and how well your heart's chambers and valves are working. This procedure takes approximately one hour. There are no restrictions for this procedure. Please do NOT wear cologne, perfume, aftershave, or lotions (deodorant is allowed). Please arrive 15 minutes prior to your appointment time.    ZIO XT- Long Term Monitor Instructions  Your physician has requested you wear a ZIO patch monitor for 14 days.  This is a single patch monitor. [employer] supplies one patch monitor per enrollment. Additional stickers are not available. Please do not apply patch if you will be having a Nuclear Stress Test,  Echocardiogram, Cardiac CT, MRI, or Chest Xray during the period you would be wearing the  monitor. The patch cannot be worn during these tests. You cannot remove and re-apply the  ZIO XT patch monitor.  Your ZIO patch monitor will be mailed 3 day USPS to your address on file. It may take 3-5 days  to receive your monitor after you have been enrolled.  Once you have received your monitor, please review the enclosed instructions. Your monitor  has already been registered assigning a  specific monitor serial # to you.  Billing and Patient Assistance Program Information  We have supplied [employer] with any of your insurance information on file for billing purposes. [employer] offers a sliding scale Patient Assistance Program for patients that do not have  insurance, or whose insurance does not completely cover the cost of the ZIO monitor.  You must apply for the Patient Assistance Program to qualify for this discounted rate.  To apply, please call [employer] at [phone], select option 4, select option 2, ask to apply for  Patient Assistance Program. Theodore Demark will ask your household income, and how many people  are in your household. They will quote your out-of-pocket cost based on that information.  [employer] will also be able to set up a 110-month interest-free payment plan if needed.  Applying the monitor   Shave hair from upper left chest.  Hold abrader disc by orange tab. Rub abrader in 40 strokes over the upper left chest as  indicated in your monitor instructions.  Clean area with 4 enclosed alcohol pads. Let dry.  Apply patch as indicated in monitor instructions. Patch will be placed under collarbone on left  side of chest with arrow pointing upward.  Rub patch adhesive wings for 2 minutes. Remove white label marked "1". Remove the white  label marked "2". Rub patch adhesive wings for 2 additional minutes.  While looking in a mirror, press and release button in center of patch. A small green light will  flash 3-4 times. This will be your only indicator that the monitor has been turned on.  Do not shower for the first 24 hours. You may shower after the first 24 hours.  Press the button if you feel a symptom. You will hear a small click. Record Date, Time and  Symptom in the  Patient Logbook.  When you are ready to remove the patch, follow instructions on the last 2 pages of Patient  Logbook. Stick patch monitor onto the last page of Patient Logbook.  Place Patient  Logbook in the blue and white box. Use locking tab on box and tape box closed  securely. The blue and white box has prepaid postage on it. Please place it in the mailbox as  soon as possible. Your physician should have your test results approximately 7 days after the  monitor has been mailed back to [facility].  Call [place] at [phone] if you have questions regarding  your ZIO XT patch monitor. Call them immediately if you see an orange light blinking on your  monitor.  If your monitor falls off in less than 4 days, contact our Monitor department at [phone].  If your monitor becomes loose or falls off after 4 days call [employer] at [phone] for  suggestions on securing your monitor    Follow-Up: At [facility], you and your health needs are our priority.  As part of our continuing mission to provide you with exceptional heart care, we have created designated Provider Care Teams.  These Care Teams include your primary Cardiologist (physician) and Advanced Practice Providers (APPs -  Physician Assistants and Nurse Practitioners) who all work together to provide you with the care you need, when you need it.  We recommend signing up for the patient portal called "MyChart".  Sign up information is provided on this After Visit Summary.  MyChart is used to connect with patients for Virtual Visits (Telemedicine).  Patients are able to view lab/test results, encounter notes, upcoming appointments, etc.  Non-urgent messages can be sent to your provider as well.   To learn more about what you can do with MyChart, go to [email or web address].    Your next appointment:   6 month(s)  The format for your next appointment:   In Person  Provider:   Robbie Lis, PA-C, Nicholes Rough, PA-C, Melina Copa, PA-C, Ambrose Pancoast, NP, Cecilie Kicks, NP, Ermalinda Barrios, PA-C, Christen Bame, NP, or Richardson Dopp, PA-C          Important Information About  Sugar         I,Mathew Stumpf,acting as a scribe for Freada Bergeron, MD.,have documented all relevant documentation on the behalf of Freada Bergeron, MD,as directed by  Freada Bergeron, MD while in the presence of Freada Bergeron, MD.  I, Freada Bergeron, MD, have reviewed all documentation for this visit. The documentation on 12/01/21 for the exam, diagnosis, procedures, and orders are all accurate and complete.   Signed, Freada Bergeron, MD  12/01/2021 10:38 AM    [place]

## 2021-12-06 ENCOUNTER — Other Ambulatory Visit: Payer: Self-pay | Admitting: Internal Medicine

## 2021-12-06 DIAGNOSIS — C73 Malignant neoplasm of thyroid gland: Secondary | ICD-10-CM

## 2021-12-17 ENCOUNTER — Ambulatory Visit (HOSPITAL_COMMUNITY): Payer: BC Managed Care – PPO | Attending: Cardiology

## 2021-12-17 DIAGNOSIS — R0602 Shortness of breath: Secondary | ICD-10-CM | POA: Insufficient documentation

## 2021-12-17 LAB — ECHOCARDIOGRAM COMPLETE
Area-P 1/2: 4.17 cm2
S' Lateral: 3.3 cm

## 2021-12-21 ENCOUNTER — Other Ambulatory Visit (HOSPITAL_COMMUNITY)
Admission: RE | Admit: 2021-12-21 | Discharge: 2021-12-21 | Disposition: A | Discharge status: Home / Self Care | Payer: BC Managed Care – PPO | Source: Ambulatory Visit | Attending: Internal Medicine | Admitting: Internal Medicine

## 2021-12-21 ENCOUNTER — Ambulatory Visit
Admission: RE | Admit: 2021-12-21 | Discharge: 2021-12-21 | Disposition: A | Discharge status: Home / Self Care | Payer: BC Managed Care – PPO | Source: Ambulatory Visit | Attending: Internal Medicine | Admitting: Internal Medicine

## 2021-12-21 DIAGNOSIS — Z9884 Bariatric surgery status: Secondary | ICD-10-CM | POA: Diagnosis not present

## 2021-12-21 DIAGNOSIS — R918 Other nonspecific abnormal finding of lung field: Secondary | ICD-10-CM | POA: Diagnosis not present

## 2021-12-21 DIAGNOSIS — C73 Malignant neoplasm of thyroid gland: Secondary | ICD-10-CM | POA: Diagnosis not present

## 2021-12-21 DIAGNOSIS — E890 Postprocedural hypothyroidism: Secondary | ICD-10-CM | POA: Diagnosis not present

## 2021-12-21 DIAGNOSIS — E079 Disorder of thyroid, unspecified: Secondary | ICD-10-CM | POA: Diagnosis not present

## 2021-12-21 DIAGNOSIS — R946 Abnormal results of thyroid function studies: Secondary | ICD-10-CM | POA: Diagnosis not present

## 2021-12-21 DIAGNOSIS — Z85850 Personal history of malignant neoplasm of thyroid: Secondary | ICD-10-CM | POA: Diagnosis not present

## 2021-12-21 DIAGNOSIS — K449 Diaphragmatic hernia without obstruction or gangrene: Secondary | ICD-10-CM | POA: Diagnosis not present

## 2021-12-21 MED ORDER — IOPAMIDOL (ISOVUE-300) INJECTION 61%
100.0000 mL | Freq: Once | INTRAVENOUS | Status: AC | PRN
  Administered 2021-12-21: 100 mL via INTRAVENOUS

## 2021-12-23 LAB — CYTOLOGY - NON PAP

## 2021-12-30 DIAGNOSIS — I10 Essential (primary) hypertension: Secondary | ICD-10-CM | POA: Diagnosis not present

## 2021-12-30 DIAGNOSIS — E119 Type 2 diabetes mellitus without complications: Secondary | ICD-10-CM | POA: Diagnosis not present

## 2021-12-30 DIAGNOSIS — Z6837 Body mass index (BMI) 37.0-37.9, adult: Secondary | ICD-10-CM | POA: Diagnosis not present

## 2022-01-08 DIAGNOSIS — R0602 Shortness of breath: Secondary | ICD-10-CM | POA: Diagnosis not present

## 2022-01-08 DIAGNOSIS — R002 Palpitations: Secondary | ICD-10-CM

## 2022-01-10 ENCOUNTER — Ambulatory Visit: Payer: BC Managed Care – PPO

## 2022-01-26 ENCOUNTER — Ambulatory Visit: Payer: BC Managed Care – PPO | Attending: Cardiology

## 2022-01-26 ENCOUNTER — Telehealth: Payer: Self-pay | Admitting: *Deleted

## 2022-01-26 DIAGNOSIS — R002 Palpitations: Secondary | ICD-10-CM

## 2022-01-26 DIAGNOSIS — E785 Hyperlipidemia, unspecified: Secondary | ICD-10-CM | POA: Diagnosis not present

## 2022-01-26 DIAGNOSIS — E890 Postprocedural hypothyroidism: Secondary | ICD-10-CM | POA: Diagnosis not present

## 2022-01-26 DIAGNOSIS — Z79899 Other long term (current) drug therapy: Secondary | ICD-10-CM | POA: Diagnosis not present

## 2022-01-26 DIAGNOSIS — R0602 Shortness of breath: Secondary | ICD-10-CM | POA: Diagnosis not present

## 2022-01-26 DIAGNOSIS — C73 Malignant neoplasm of thyroid gland: Secondary | ICD-10-CM | POA: Diagnosis not present

## 2022-01-26 DIAGNOSIS — R9389 Abnormal findings on diagnostic imaging of other specified body structures: Secondary | ICD-10-CM | POA: Diagnosis not present

## 2022-01-26 MED ORDER — METOPROLOL SUCCINATE ER 50 MG PO TB24: 50.0000 mg | ORAL_TABLET | Freq: Every day | ORAL | 1 refills | Status: DC

## 2022-01-26 NOTE — Telephone Encounter (Signed)
-----   Message from Sueanne Margarita, MD sent at 01/26/2022  5:46 PM EST ----- Heart monitor showed runs of extra heart beats from top of the heart up to 187bpm and extra heart beats from top and bottom of heart.  She was recently changed from Toprol XL 12.'5mg'$  daily to Carvedilol 12.'5mg'$  BID for better BP control.    I think Toprol is better to suppress palpitations. Please stop carvedilol and start Toprol XL to '50mg'$  daily and check BP and HR daily for a week and call with results.  Followup with Dr. Johney Frame in 2 weeks. Have her come in for BMET, TSH and Mag

## 2022-01-26 NOTE — Telephone Encounter (Signed)
The patient has been notified of the result and verbalized understanding.  All questions (if any) were answered.  Pt aware to stop taking coreg and we will put her back on Toprol XL at 50 mg po daily.  Advised her to check her HR/BP daily at home and send some readings via mychart in the next week, if she can.  Pt states she will have to go and purchase a cuff to be able to monitor this, and will try tracking this and send some readings if available.   Pt states she just saw her Endocrinologist today who manages her TSH and she will have this followed by them as directed, so no need to order this on our end.   She will come into the office to see Nicholes Rough PA-C in 3 weeks (out of town at the 2 week mark and Dr. Johney Frame is not in the office).  Scheduled the pt to see Nicholes Rough PA-C on 02/15/22 at 2:45 pm.  Pt wants to have her labs done to check BMET and Mg level same day as this appt, for she is out of town until that time.  Scheduled her a lab appt same day as this visit on 02/15/22 with Tessa.  She is aware to get this done prior to the appt or after the appt.   Confirmed the pharmacy of choice with the pt.   Pt verbalized understanding and agrees with this plan.  Will make Dr. Radford Pax (covering for Dr. Johney Frame) aware that pt will see Tessa on 02/15/22 and will have lab same day to only check BMET and Mg level, since Endocrinology heavily manages her TSH levels.

## 2022-01-27 ENCOUNTER — Telehealth: Payer: Self-pay | Admitting: *Deleted

## 2022-01-27 DIAGNOSIS — Z79899 Other long term (current) drug therapy: Secondary | ICD-10-CM

## 2022-01-27 DIAGNOSIS — E785 Hyperlipidemia, unspecified: Secondary | ICD-10-CM

## 2022-01-27 DIAGNOSIS — I700 Atherosclerosis of aorta: Secondary | ICD-10-CM

## 2022-01-27 DIAGNOSIS — E119 Type 2 diabetes mellitus without complications: Secondary | ICD-10-CM

## 2022-01-27 LAB — LIPID PANEL
Chol/HDL Ratio: 4.2 ratio (ref 0.0–4.4)
Cholesterol, Total: 121 mg/dL (ref 100–199)
HDL: 29 mg/dL — ABNORMAL LOW (ref >39)
LDL Chol Calc (NIH): 56 mg/dL (ref 0–99)
Triglycerides: 219 mg/dL — ABNORMAL HIGH (ref 0–149)
VLDL Cholesterol Cal: 36 mg/dL (ref 5–40)

## 2022-01-27 MED ORDER — ROSUVASTATIN CALCIUM 40 MG PO TABS: 40.0000 mg | ORAL_TABLET | Freq: Every day | ORAL | 1 refills | Status: DC

## 2022-01-27 NOTE — Telephone Encounter (Signed)
The patient has been notified of the result and verbalized understanding.  All questions (if any) were answered.  Pt aware to increase her crestor to 40 mg po daily and come in for repeat lipids and ALT in 8 weeks to reassess.   Confirmed the pharmacy of choice with the pt.   Scheduled the pt for lipids and ALT in 8 weeks on 03/25/22.  She is aware to come fasting to this lab appt.  Pt verbalized understanding and agrees with this plan.

## 2022-01-27 NOTE — Telephone Encounter (Signed)
-----   Message from Sueanne Margarita, MD sent at 01/27/2022  1:49 PM EST ----- Increase Crestor to '40mg'$  daily for elevated TAGS and repeat FLP and ALT in 8 weeks ----- Message ----- From: Nuala Alpha, LPN Sent: 52/59/1028  12:27 PM EST To: Freada Bergeron, MD  FYI- please review below--she was fasting for this lab   ----- Message ----- From: Sueanne Margarita, MD Sent: 01/27/2022   8:27 AM EST To: Nuala Alpha, LPN  LDL at goal but TAGS are elevated - please verify patient was fasting

## 2022-02-09 ENCOUNTER — Encounter: Payer: Self-pay | Admitting: Cardiology

## 2022-02-13 NOTE — Progress Notes (Signed)
Office Visit    Patient Name: Lauren [name] Date of Encounter: 02/15/2022  PCP:  Shirlean Mylar, MD   [facility]  Cardiologist:  Meriam Sprague, MD  Advanced Practice Provider:  No care team member to display Electrophysiologist:  None   HPI    Lauren [name] is a 66 y.o. female with a past medical history of hypertension, hyperlipidemia, and ITP who was referred by Dr. Hyman Hopes for further evaluation for palpitations presents today for follow-up.  The patient was seen by Dr. Hyman Hopes 10/27/2021.  The patient reports a long history of palpitations that were most notable at rest as well as dyspnea on exertion.  Requested cardiology follow-up.  She was seen by Dr. Shari Prows 08/31/2021 and she was having palpitations quite frequently initially a couple times a day.  Recently they had been less frequent about 3 times in the last 2 weeks.  Most the time they go randomly while she is sitting at her desk.  They feel rapid.  Smart watch has detected heart rates up to 112 bpm but most of the time in the 90s.  Confirms that she is taking 1 tablet 25 mg of metoprolol daily.  All medications are taken in the morning aside from simvastatin which is taken nightly.  Blood pressure has been elevated at her primary care office as well as at our office.  She claims that her blood pressure usually is 120s over 70s at home.  She uses both a wrist and arm cuff.  It does not always fit properly.  Previously had blood pressure with headache.  Denied headaches in the past 2 to 3 weeks.  She had lost some weight.  She recently started on metformin which she attributes to the weight loss to.  She continued to work on weight loss however is limited by her ankle, knee, and hip pain while walking.  She denies chest pain or peripheral edema.  No lightheadedness, syncope, orthopnea, or PND.   Her father had carotid artery disease and an aortic aneurysm, he was a known smoker.  Brother has a history of  atrial fibrillation.  Of note, she reported detecting a recurring thyroid cancer.  Biopsy was scheduled at the time for appointment.  Today, she has been feeling better since the medication changes have been made. She was changed from carvedilol to Toprol XL. She feels like it has helped her palpitations. She also changed statins. She does endorse fatigue but she has recurrent thyroid cancer and they increased her thyroid medication. This could certainly be contributing. She also was on B12 shots at one time and plans to get it rechecked when she sees her primary.   Reports no shortness of breath nor dyspnea on exertion. Reports no chest pain, pressure, or tightness. No edema, orthopnea, PND. Reports no palpitations.    Past Medical History    Past Medical History:  Diagnosis Date   Hyperlipidemia    Hypertension    Idiopathic thrombocytopenic purpura (ITP) (HCC) 1987   not current problem, no hematologist   Past Surgical History:  Procedure Laterality Date   ABDOMINAL HYSTERECTOMY  11-18-1998   partial   CHOLECYSTECTOMY  08/02/2011   Procedure: LAPAROSCOPIC CHOLECYSTECTOMY WITH INTRAOPERATIVE CHOLANGIOGRAM;  Surgeon: Valarie Merino, MD;  Location: [facility];  Service: General;  Laterality: N/A;   LAPAROSCOPIC GASTRIC BANDING  05/19/2008   LUMBAR DISC SURGERY  03/21/1996   THYROIDECTOMY N/A 09/08/2015   Procedure: TOTAL THYROIDECTOMY;  Surgeon: Darnell Level,  MD;  Location: [facility];  Service: General;  Laterality: N/A;   TONSILLECTOMY      Allergies  No Known Allergies   EKGs/Labs/Other Studies Reviewed:   The following studies were reviewed today:  Cardiac monitor 12/01/21    Predominant rhythm was normal sinus rhythm with average heart rate 79bpm and ranged from 58 to 115bpm.   11 episodes of SVT s/w atrial tachycardia with longest episode lasting 16.5 sec and fastest rate 187bpm.   Rare PACs, atrial couplets and triplets   Rare PVCs, ventricular couplets and ventricular trigeminy.   PVC load < 1%     Patch Wear Time:  13 days and 21 hours (2023-11-25T10:57:07-498 to 2023-12-09T08:45:58-0500)   Patient had a min HR of 58 bpm, max HR of 187 bpm, and avg HR of 79 bpm. Predominant underlying rhythm was Sinus Rhythm. 11 Supraventricular Tachycardia runs occurred, the run with the fastest interval lasting 5 beats with a max rate of 187 bpm, the  longest lasting 16.5 secs with an avg rate of 118 bpm. Isolated SVEs were rare (<1.0%), SVE Couplets were rare (<1.0%), and SVE Triplets were rare (<1.0%). Isolated VEs were rare (<1.0%), VE Couplets were rare (<1.0%), and no VE Triplets were present.  Ventricular Trigeminy was present.     EKG:  EKG is not ordered today.   Recent Labs: No results found for requested labs within last 365 days.  Recent Lipid Panel    Component Value Date/Time   CHOL 121 01/26/2022 0939   TRIG 219 (H) 01/26/2022 0939   HDL 29 (L) 01/26/2022 0939   CHOLHDL 4.2 01/26/2022 0939   LDLCALC 56 01/26/2022 0939    Home Medications   Current Meds  Medication Sig   aspirin 81 MG tablet Take 81 mg by mouth daily.    Cholecalciferol (VITAMIN D3) 25 MCG (1000 UT) CAPS    docusate sodium (COLACE) 100 MG capsule Take 100 mg by mouth daily.   EUTHYROX 175 MCG tablet Take 175 mcg by mouth daily before breakfast.   metFORMIN (GLUCOPHAGE) 500 MG tablet 1 Tablet(s) By Mouth Every Evening   metoprolol succinate (TOPROL XL) 50 MG 24 hr tablet Take 1 tablet (50 mg total) by mouth daily. Take with or immediately following a meal.   rosuvastatin (CRESTOR) 40 MG tablet Take 1 tablet (40 mg total) by mouth daily.   terbinafine (LAMISIL) 250 MG tablet      Review of Systems      All other systems reviewed and are otherwise negative except as noted above.  Physical Exam    VS:  BP 138/86   Pulse 66   Ht 5\' 6"  (1.676 m)   Wt 221 lb (100.2 kg)   SpO2 98%   BMI 35.67 kg/m  , BMI Body mass index is 35.67 kg/m.  Wt Readings from Last 3 Encounters:  02/15/22  221 lb (100.2 kg)  12/01/21 223 lb 3.2 oz (101.2 kg)  10/05/15 232 lb (105.2 kg)     GEN: Well nourished, well developed, in no acute distress. HEENT: normal. Neck: Supple, no JVD, carotid bruits, or masses. Cardiac: RRR, no murmurs, rubs, or gallops. No clubbing, cyanosis, edema.  Radials/PT 2+ and equal bilaterally.  Respiratory:  Respirations regular and unlabored, clear to auscultation bilaterally. GI: Soft, nontender, nondistended. MS: No deformity or atrophy. Skin: Warm and dry, no rash. Neuro:  Strength and sensation are intact. Psych: Normal affect.  Assessment & Plan    Palpitations -better on Toprol XL  -check  labs BMP, mag  DOE -sometimes she gets winded getting up in the middle of the night to use the bathroom -not normally edema, every once in a while with sodium intake -continue to use compression hose as needed  HTN -well controlled today -she has been monitoring at home using a wrist cuff and its usually lower -continue current mediations: Lotensin HCT 20-25mg  daily, Toprol-XL  HLD/aortic atherosclerosis -recently changed to Crestor 40mg  daily -Triglycerides were elevated and plan to repeat in 8 weeks   Obesity/DM2 -encourage daily exercise and nutrition management -she sees an endocrinologist for her diabetes and recently started on medication -per PCP to continue to track A1c closely      Disposition: Follow up 3 months with Meriam Sprague, MD or APP.  Signed, Sharlene Dory, PA-C 02/15/2022, 4:35 PM [facility]

## 2022-02-15 ENCOUNTER — Ambulatory Visit: Payer: BC Managed Care – PPO | Attending: Cardiology

## 2022-02-15 ENCOUNTER — Ambulatory Visit: Payer: BC Managed Care – PPO | Attending: Physician Assistant | Admitting: Physician Assistant

## 2022-02-15 ENCOUNTER — Encounter: Payer: Self-pay | Admitting: Physician Assistant

## 2022-02-15 VITALS — BP 138/86 | HR 66 | Ht 66.0 in | Wt 221.0 lb

## 2022-02-15 DIAGNOSIS — E119 Type 2 diabetes mellitus without complications: Secondary | ICD-10-CM

## 2022-02-15 DIAGNOSIS — R002 Palpitations: Secondary | ICD-10-CM | POA: Diagnosis not present

## 2022-02-15 DIAGNOSIS — I700 Atherosclerosis of aorta: Secondary | ICD-10-CM | POA: Diagnosis not present

## 2022-02-15 DIAGNOSIS — I10 Essential (primary) hypertension: Secondary | ICD-10-CM

## 2022-02-15 DIAGNOSIS — Z79899 Other long term (current) drug therapy: Secondary | ICD-10-CM | POA: Diagnosis not present

## 2022-02-15 DIAGNOSIS — E785 Hyperlipidemia, unspecified: Secondary | ICD-10-CM

## 2022-02-15 DIAGNOSIS — R0602 Shortness of breath: Secondary | ICD-10-CM

## 2022-02-15 NOTE — Patient Instructions (Signed)
Medication Instructions:  Your physician recommends that you continue on your current medications as directed. Please refer to the Current Medication list given to you today.  *If you need a refill on your cardiac medications before your next appointment, please call your pharmacy*   Lab Work: None ordered  If you have labs (blood work) drawn today and your tests are completely normal, you will receive your results only by: [place] (if you have MyChart) OR A paper copy in the mail If you have any lab test that is abnormal or we need to change your treatment, we will call you to review the results.   Testing/Procedures: None ordered   Follow-Up: At [facility], you and your health needs are our priority.  As part of our continuing mission to provide you with exceptional heart care, we have created designated Provider Care Teams.  These Care Teams include your primary Cardiologist (physician) and Advanced Practice Providers (APPs -  Physician Assistants and Nurse Practitioners) who all work together to provide you with the care you need, when you need it.  We recommend signing up for the patient portal called "MyChart".  Sign up information is provided on this After Visit Summary.  MyChart is used to connect with patients for Virtual Visits (Telemedicine).  Patients are able to view lab/test results, encounter notes, upcoming appointments, etc.  Non-urgent messages can be sent to your provider as well.   To learn more about what you can do with MyChart, go to [email or web address].    Your next appointment:   3 month(s)  The format for your next appointment:   In Person  Provider:   Gwyndolyn Kaufman, MD     Other Instructions    Important Information About Sugar

## 2022-02-16 LAB — BASIC METABOLIC PANEL
BUN/Creatinine Ratio: 21 (ref 12–28)
BUN: 15 mg/dL (ref 8–27)
CO2: 26 mmol/L (ref 20–29)
Calcium: 9.5 mg/dL (ref 8.7–10.3)
Chloride: 98 mmol/L (ref 96–106)
Creatinine, Ser: 0.73 mg/dL (ref 0.57–1.00)
Glucose: 259 mg/dL — ABNORMAL HIGH (ref 70–99)
Potassium: 4 mmol/L (ref 3.5–5.2)
Sodium: 138 mmol/L (ref 134–144)
eGFR: 91 mL/min/{1.73_m2} (ref >59)

## 2022-02-16 LAB — MAGNESIUM: Magnesium: 1.8 mg/dL (ref 1.6–2.3)

## 2022-02-22 DIAGNOSIS — E538 Deficiency of other specified B group vitamins: Secondary | ICD-10-CM | POA: Diagnosis not present

## 2022-02-22 DIAGNOSIS — I10 Essential (primary) hypertension: Secondary | ICD-10-CM | POA: Diagnosis not present

## 2022-02-22 DIAGNOSIS — E559 Vitamin D deficiency, unspecified: Secondary | ICD-10-CM | POA: Diagnosis not present

## 2022-02-22 DIAGNOSIS — E119 Type 2 diabetes mellitus without complications: Secondary | ICD-10-CM | POA: Diagnosis not present

## 2022-03-04 ENCOUNTER — Ambulatory Visit
Admission: RE | Admit: 2022-03-04 | Discharge: 2022-03-04 | Disposition: A | Discharge status: Home / Self Care | Payer: BC Managed Care – PPO | Source: Ambulatory Visit | Attending: Family Medicine | Admitting: Family Medicine

## 2022-03-04 DIAGNOSIS — Z1231 Encounter for screening mammogram for malignant neoplasm of breast: Secondary | ICD-10-CM | POA: Diagnosis not present

## 2022-03-07 ENCOUNTER — Other Ambulatory Visit: Payer: Self-pay | Admitting: Family Medicine

## 2022-03-07 DIAGNOSIS — R928 Other abnormal and inconclusive findings on diagnostic imaging of breast: Secondary | ICD-10-CM

## 2022-03-10 ENCOUNTER — Other Ambulatory Visit: Payer: Self-pay | Admitting: Family Medicine

## 2022-03-10 ENCOUNTER — Ambulatory Visit
Admission: RE | Admit: 2022-03-10 | Discharge: 2022-03-10 | Disposition: A | Discharge status: Home / Self Care | Payer: BC Managed Care – PPO | Source: Ambulatory Visit | Attending: Family Medicine | Admitting: Family Medicine

## 2022-03-10 DIAGNOSIS — N6489 Other specified disorders of breast: Secondary | ICD-10-CM | POA: Diagnosis not present

## 2022-03-10 DIAGNOSIS — R928 Other abnormal and inconclusive findings on diagnostic imaging of breast: Secondary | ICD-10-CM | POA: Diagnosis not present

## 2022-03-10 DIAGNOSIS — R921 Mammographic calcification found on diagnostic imaging of breast: Secondary | ICD-10-CM

## 2022-03-15 ENCOUNTER — Ambulatory Visit
Admission: RE | Admit: 2022-03-15 | Discharge: 2022-03-15 | Disposition: A | Discharge status: Home / Self Care | Payer: BC Managed Care – PPO | Source: Ambulatory Visit | Attending: Family Medicine | Admitting: Family Medicine

## 2022-03-15 DIAGNOSIS — N6489 Other specified disorders of breast: Secondary | ICD-10-CM

## 2022-03-15 DIAGNOSIS — R928 Other abnormal and inconclusive findings on diagnostic imaging of breast: Secondary | ICD-10-CM | POA: Diagnosis not present

## 2022-03-15 DIAGNOSIS — R921 Mammographic calcification found on diagnostic imaging of breast: Secondary | ICD-10-CM

## 2022-03-15 DIAGNOSIS — C50412 Malignant neoplasm of upper-outer quadrant of left female breast: Secondary | ICD-10-CM | POA: Diagnosis not present

## 2022-03-15 DIAGNOSIS — D0512 Intraductal carcinoma in situ of left breast: Secondary | ICD-10-CM | POA: Diagnosis not present

## 2022-03-15 HISTORY — PX: BREAST BIOPSY: SHX20

## 2022-03-18 ENCOUNTER — Telehealth: Payer: Self-pay | Admitting: Hematology and Oncology

## 2022-03-18 NOTE — Telephone Encounter (Signed)
Spoke to patient to confirm upcoming morning [facility] clinic appointment  on 2/7 paperwork will be sent via e-mail.   Gave location and time, also informed patient that the surgeon's office would be calling as well to get information from them similar to the packet that they will be receiving so make sure to do both.  Reminded patient that all providers will be coming to the clinic to see them HERE and if they had any questions to not hesitate to reach back out to myself or their navigators.

## 2022-03-21 ENCOUNTER — Encounter: Payer: Self-pay | Admitting: *Deleted

## 2022-03-21 DIAGNOSIS — Z170 Estrogen receptor positive status [ER+]: Secondary | ICD-10-CM | POA: Insufficient documentation

## 2022-03-22 ENCOUNTER — Encounter: Payer: Self-pay | Admitting: Genetic Counselor

## 2022-03-22 NOTE — Progress Notes (Signed)
Radiation Oncology         [phone] ________________________________  Name: Lauren [name]        MRN: [id number]  Date of Service: 03/23/2022 DOB: [birth year]  JK:DTOI, Arbie Cookey, MD  Coralie Keens, MD     REFERRING PHYSICIAN: Coralie Keens, MD   DIAGNOSIS: The encounter diagnosis was Malignant neoplasm of upper-outer quadrant of left breast in female, estrogen receptor positive ([place]).   HISTORY OF PRESENT ILLNESS: Lauren [name] is a 67 y.o. female seen in the multidisciplinary breast clinic for a new diagnosis of left breast cancer. The patient was noted to have a screening detected group of calcifications and distortion in the left breast. The calcifications measured up to 9.2 cm by mammogram, and 4.9 cm of non mass fainding in the 2:00 area by ultrasound. While she had multiple visualized axillary nodes, none of them were abnormal in appearance.  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 There was also intermediate grade DCIS with necrosis that was ER/PR positive. She's seen to discuss treatment recommendations of her cancer.   PREVIOUS RADIATION THERAPY:   02/24/15: The patient received radioactive iodine for treatment of thyroid cancer.    PAST MEDICAL HISTORY:  Past Medical History:  Diagnosis Date   Hyperlipidemia    Hypertension    Idiopathic thrombocytopenic purpura (ITP) ([place]) 1987   not current problem, no hematologist       PAST SURGICAL HISTORY: Past Surgical History:  Procedure Laterality Date   ABDOMINAL HYSTERECTOMY  11-18-1998   partial   BREAST BIOPSY Left 03/15/2022   [place] LT BREAST BX W LOC DEV 1ST LESION IMG BX SPEC US GUIDE 03/15/2022 [facility]   BREAST BIOPSY Left 03/15/2022   [place] LT BREAST BX W LOC DEV EA ADD LESION IMG BX [place] US GUIDE 03/15/2022 [facility]   CHOLECYSTECTOMY  08/02/2011   Procedure: LAPAROSCOPIC  CHOLECYSTECTOMY WITH INTRAOPERATIVE CHOLANGIOGRAM;  Surgeon: Pedro Earls, MD;  Location: [facility];  Service: General;  Laterality: N/A;   LAPAROSCOPIC GASTRIC BANDING  05/19/2008   LUMBAR [place] [facility]  03/21/1996   THYROIDECTOMY N/A 09/08/2015   Procedure: TOTAL THYROIDECTOMY;  Surgeon: Armandina Gemma, MD;  Location: [facility];  Service: General;  Laterality: N/A;   TONSILLECTOMY       FAMILY HISTORY:  Family History  Problem Relation Age of Onset   Colon polyps Mother    Heart disease Mother    Diabetes Father    Heart disease Father    Breast cancer Maternal Aunt      SOCIAL HISTORY:  reports that she has never smoked. She has never used smokeless tobacco. She reports that she does not drink alcohol and does not use drugs. The patient is married and lives in [place]. 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 She is hoping to retire at the end of this year. She's accompanied by her husband and daughter Lexine Baton.    ALLERGIES: Patient has no known allergies.   MEDICATIONS:  Current Outpatient Medications  Medication Sig Dispense Refill   aspirin 81 MG tablet Take 81 mg by mouth daily.      benazepril-hydrochlorthiazide (LOTENSIN HCT) 20-25 MG tablet Take 1 tablet by mouth daily.     Cholecalciferol (VITAMIN D3) 25 MCG (1000 UT) CAPS      docusate sodium (COLACE) 100 MG capsule Take 100 mg  by mouth daily.     EUTHYROX 175 MCG tablet Take 175 mcg by mouth daily before breakfast.     metFORMIN (GLUCOPHAGE) 500 MG tablet 1 Tablet(s) By Mouth Every Evening     metoprolol succinate (TOPROL XL) 50 MG 24 hr tablet Take 1 tablet (50 mg total) by mouth daily. Take with or immediately following a meal. 90 tablet 1   rosuvastatin (CRESTOR) 40 MG tablet Take 1 tablet (40 mg total) by mouth daily. 90 tablet 1   terbinafine (LAMISIL) 250 MG tablet      No current facility-administered medications for this visit.     REVIEW OF SYSTEMS: On review of  systems, the patient reports that she is doing well overall. She's had some discomfort since her biopsy but no other breast specific complaints are noted.      PHYSICAL EXAM:  Wt Readings from Last 3 Encounters:  02/15/22 221 lb (100.2 kg)  12/01/21 223 lb 3.2 oz (101.2 kg)  10/05/15 232 lb (105.2 kg)   Temp Readings from Last 3 Encounters:  10/05/15 97.9 F (36.6 C) (Oral)  09/09/15 97.7 F (36.5 C) (Oral)  09/04/15 98.7 F (37.1 C) (Oral)   BP Readings from Last 3 Encounters:  02/15/22 138/86  12/01/21 (!) 150/98  10/06/15 148/99   Pulse Readings from Last 3 Encounters:  02/15/22 66  12/01/21 79  10/06/15 69    In general this is a well appearing caucasian female in no acute distress. She's alert and oriented x4 and appropriate throughout the examination. Cardiopulmonary assessment is negative for acute distress and she exhibits normal effort. Bilateral breast exam is deferred.    ECOG = 1  0 - Asymptomatic (Fully active, able to carry on all predisease activities without restriction)  1 - Symptomatic but completely ambulatory (Restricted in physically strenuous activity but ambulatory and able to carry out work of a light or sedentary nature. For example, light housework, office work)  2 - Symptomatic, <50% in bed during the day (Ambulatory and capable of all self care but unable to carry out any work activities. Up and about more than 50% of waking hours)  3 - Symptomatic, >50% in bed, but not bedbound (Capable of only limited self-care, confined to bed or chair 50% or more of waking hours)  4 - Bedbound (Completely disabled. Cannot carry on any self-care. Totally confined to bed or chair)  5 - Death   Eustace Pen MM, Creech RH, Tormey DC, et al. [phone]). "Toxicity and response criteria of the [facility]". [place] Oncol. 5 (6): 649-55    LABORATORY DATA:  Lab Results  Component Value Date   WBC 5.4 10/05/2015   HGB 13.6 10/05/2015   HCT  39.0 10/05/2015   MCV 88.4 10/05/2015   PLT 162 10/05/2015   Lab Results  Component Value Date   NA 138 02/15/2022   K 4.0 02/15/2022   CL 98 02/15/2022   CO2 26 02/15/2022   Lab Results  Component Value Date   ALT 28 08/03/2011   AST 29 08/03/2011   ALKPHOS 56 08/03/2011   BILITOT 0.5 08/03/2011      RADIOGRAPHY: [place] LT BREAST BX W LOC DEV 1ST LESION IMG BX SPEC US GUIDE  Addendum Date: 03/18/2022   ADDENDUM REPORT: 03/18/2022 10:32 ADDENDUM: Pathology revealed GRADE II INVASIVE MAMMARY CARCINOMA, DUCTAL CARCINOMA IN SITU, CRIBRIFORM TYPE, NUCLEAR GRADE 2 OF 3 of the LEFT breast, 2 o'clock, 3 cmfn, (coil clip). This was found to  be concordant by Dr. Kristopher Oppenheim. Pathology revealed DUCTAL CARCINOMA IN SITU, CRIBRIFORM TYPE, NUCLEAR GRADE 2 OF 3, NECROSIS: SINGLE CELL NECROSIS of the LEFT breast, 2 o'clock, 5 cmfn, (heart clip). This was found to be concordant by Dr. Kristopher Oppenheim. Pathology results were discussed with the patient by telephone. The patient reported doing well after the biopsies with tenderness at the sites. Post biopsy instructions and care were reviewed and questions were answered. The patient was encouraged to call [facility] for any additional concerns. My direct phone number was provided. The patient was referred to [facility] on March 23, 2022. Pathology results reported by Terie Purser, RN on 03/17/2022. Electronically Signed   By: Kristopher Oppenheim M.D.   On: 03/18/2022 10:32   Result Date: 03/18/2022 CLINICAL DATA:  67 year old female with a suspicious left breast findings. EXAM: ULTRASOUND GUIDED LEFT BREAST CORE NEEDLE BIOPSY x2 COMPARISON:  Previous exam(s). PROCEDURE: I met with the patient and we discussed the procedure of ultrasound-guided biopsy, including benefits and alternatives. We discussed the high likelihood of a successful procedure. We discussed the  risks of the procedure, including infection, bleeding, tissue injury, clip migration, and inadequate sampling. Informed written consent was given. The usual time-out protocol was performed immediately prior to the procedure. Lesion quadrant: Upper outer quadrant Using sterile technique and 1% Lidocaine as local anesthetic, under direct ultrasound visualization, a 14 gauge spring-loaded device was used to perform biopsy of a focal, suspicious area at the 2 o'clock position 3 cm from the nipple using a inferior approach. At the conclusion of the procedure a coil shaped tissue marker clip was deployed into the biopsy cavity. Lesion quadrant: Upper outer quadrant Using sterile technique and 1% Lidocaine as local anesthetic, under direct ultrasound visualization, a 14 gauge spring-loaded device was used to perform biopsy of a focal, suspicious area at the 2 o'clock position 5 cm from the nipple using a inferior approach. At the conclusion of the procedure a heart shaped tissue marker clip was deployed into the biopsy cavity. Follow up 2 view mammogram was performed and dictated separately. IMPRESSION: Ultrasound guided biopsy of the left breast x2. No apparent complications. Electronically Signed: By: Kristopher Oppenheim M.D. On: 03/15/2022 10:30  [place] LT BREAST BX W LOC DEV EA ADD LESION IMG BX SPEC US GUIDE  Addendum Date: 03/18/2022   ADDENDUM REPORT: 03/18/2022 10:32 ADDENDUM: Pathology revealed GRADE II INVASIVE MAMMARY CARCINOMA, DUCTAL CARCINOMA IN SITU, CRIBRIFORM TYPE, NUCLEAR GRADE 2 OF 3 of the LEFT breast, 2 o'clock, 3 cmfn, (coil clip). This was found to be concordant by Dr. Kristopher Oppenheim. Pathology revealed DUCTAL CARCINOMA IN SITU, CRIBRIFORM TYPE, NUCLEAR GRADE 2 OF 3, NECROSIS: SINGLE CELL NECROSIS of the LEFT breast, 2 o'clock, 5 cmfn, (heart clip). This was found to be concordant by Dr. Kristopher Oppenheim. Pathology results were discussed with the patient by telephone. The patient reported doing well after the  biopsies with tenderness at the sites. Post biopsy instructions and care were reviewed and questions were answered. The patient was encouraged to call [facility] for any additional concerns. My direct phone number was provided. The patient was referred to [facility] at [facility] on March 23, 2022. Pathology results reported by Terie Purser, RN on 03/17/2022. Electronically Signed   By: Kristopher Oppenheim M.D.   On: 03/18/2022 10:32   Result Date:  03/18/2022 CLINICAL DATA:  67 year old female with a suspicious left breast findings. EXAM: ULTRASOUND GUIDED LEFT BREAST CORE NEEDLE BIOPSY x2 COMPARISON:  Previous exam(s). PROCEDURE: I met with the patient and we discussed the procedure of ultrasound-guided biopsy, including benefits and alternatives. We discussed the high likelihood of a successful procedure. We discussed the risks of the procedure, including infection, bleeding, tissue injury, clip migration, and inadequate sampling. Informed written consent was given. The usual time-out protocol was performed immediately prior to the procedure. Lesion quadrant: Upper outer quadrant Using sterile technique and 1% Lidocaine as local anesthetic, under direct ultrasound visualization, a 14 gauge spring-loaded device was used to perform biopsy of a focal, suspicious area at the 2 o'clock position 3 cm from the nipple using a inferior approach. At the conclusion of the procedure a coil shaped tissue marker clip was deployed into the biopsy cavity. Lesion quadrant: Upper outer quadrant Using sterile technique and 1% Lidocaine as local anesthetic, under direct ultrasound visualization, a 14 gauge spring-loaded device was used to perform biopsy of a focal, suspicious area at the 2 o'clock position 5 cm from the nipple using a inferior approach. At the conclusion of the procedure a heart shaped tissue marker clip was deployed into the  biopsy cavity. Follow up 2 view mammogram was performed and dictated separately. IMPRESSION: Ultrasound guided biopsy of the left breast x2. No apparent complications. Electronically Signed: By: Kristopher Oppenheim M.D. On: 03/15/2022 10:30  MM CLIP PLACEMENT LEFT  Result Date: 03/15/2022 CLINICAL DATA:  67 year old female as post left breast ultrasound biopsies. EXAM: 3D DIAGNOSTIC LEFT MAMMOGRAM POST ULTRASOUND BIOPSY COMPARISON:  Previous exam(s). FINDINGS: 3D Mammographic images were obtained following ultrasound guided biopsy of the upper-outer left breast. The biopsy marking clips are in the expected positions. IMPRESSION: Appropriate positioning of both post biopsy marking clips. Final Assessment: Post Procedure Mammograms for Marker Placement Electronically Signed   By: Kristopher Oppenheim M.D.   On: 03/15/2022 10:54  MM DIAG BREAST TOMO UNI LEFT  Result Date: 03/10/2022 CLINICAL DATA:  Patient returns after screening for evaluation LEFT breast calcifications and possible distortion. EXAM: DIGITAL DIAGNOSTIC UNILATERAL LEFT MAMMOGRAM WITH TOMOSYNTHESIS; ULTRASOUND LEFT BREAST LIMITED TECHNIQUE: Left digital diagnostic mammography and breast tomosynthesis was performed.; Targeted ultrasound examination of the left breast was performed. COMPARISON:  Previous exam(s). ACR Breast Density Category b: There are scattered areas of fibroglandular density. FINDINGS: Additional 2-D and 3-D images are performed. These views show extensive coarse calcifications in the UPPER-OUTER QUADRANT of the LEFT breast, spanning 9.2 x 4.7 x 1.9 centimeters. Some of the calcifications are linear in distribution, particularly near the nipple. A focal asymmetry is identified in the UPPER-OUTER QUADRANT and is associated with numerous calcifications, approximately 6 centimeters from the nipple. On physical exam, I palpate nodular thickening in the UPPER-OUTER QUADRANT of the LEFT breast. I palpate no discrete mass. Targeted ultrasound is  performed, showing a heterogeneous non mass segmental finding in the UPPER-OUTER QUADRANT of the LEFT breast, well seen in the 2 o'clock location 6 centimeters from the nipple, measuring 4.9 x 1.3 x 3.3 centimeters. There is associated internal blood flow. At real-time exam, numerous calcifications are identified throughout this region. Evaluation of the LEFT axilla shows numerous lymph nodes with fatty replaced hila and thin cortices, symmetric compared to the RIGHT axilla which is scanned comparison purposes. IMPRESSION: Suspicious calcifications and non mass finding in the 2 o'clock location of the LEFT breast warranting tissue diagnosis. No LEFT axillary adenopathy. RECOMMENDATION: Recommend 2 site ultrasound-guided  core biopsy of the 2 o'clock location of the LEFT breast, targeting the more distal and proximal aspects for extent of disease. I have discussed the findings and recommendations with the patient. If applicable, a reminder letter will be sent to the patient regarding the next appointment. BI-RADS CATEGORY  4: Suspicious. Electronically Signed   By: Nolon Nations M.D.   On: 03/10/2022 13:01  US BREAST LTD UNI LEFT INC AXILLA  Result Date: 03/10/2022 CLINICAL DATA:  Patient returns after screening for evaluation LEFT breast calcifications and possible distortion. EXAM: DIGITAL DIAGNOSTIC UNILATERAL LEFT MAMMOGRAM WITH TOMOSYNTHESIS; ULTRASOUND LEFT BREAST LIMITED TECHNIQUE: Left digital diagnostic mammography and breast tomosynthesis was performed.; Targeted ultrasound examination of the left breast was performed. COMPARISON:  Previous exam(s). ACR Breast Density Category b: There are scattered areas of fibroglandular density. FINDINGS: Additional 2-D and 3-D images are performed. These views show extensive coarse calcifications in the UPPER-OUTER QUADRANT of the LEFT breast, spanning 9.2 x 4.7 x 1.9 centimeters. Some of the calcifications are linear in distribution, particularly near the nipple.  A focal asymmetry is identified in the UPPER-OUTER QUADRANT and is associated with numerous calcifications, approximately 6 centimeters from the nipple. On physical exam, I palpate nodular thickening in the UPPER-OUTER QUADRANT of the LEFT breast. I palpate no discrete mass. Targeted ultrasound is performed, showing a heterogeneous non mass segmental finding in the UPPER-OUTER QUADRANT of the LEFT breast, well seen in the 2 o'clock location 6 centimeters from the nipple, measuring 4.9 x 1.3 x 3.3 centimeters. There is associated internal blood flow. At real-time exam, numerous calcifications are identified throughout this region. Evaluation of the LEFT axilla shows numerous lymph nodes with fatty replaced hila and thin cortices, symmetric compared to the RIGHT axilla which is scanned comparison purposes. IMPRESSION: Suspicious calcifications and non mass finding in the 2 o'clock location of the LEFT breast warranting tissue diagnosis. No LEFT axillary adenopathy. RECOMMENDATION: Recommend 2 site ultrasound-guided core biopsy of the 2 o'clock location of the LEFT breast, targeting the more distal and proximal aspects for extent of disease. I have discussed the findings and recommendations with the patient. If applicable, a reminder letter will be sent to the patient regarding the next appointment. BI-RADS CATEGORY  4: Suspicious. Electronically Signed   By: Nolon Nations M.D.   On: 03/10/2022 13:01  MM 3D SCREEN BREAST BILATERAL  Result Date: 03/04/2022 CLINICAL DATA:  Screening. EXAM: DIGITAL SCREENING BILATERAL MAMMOGRAM WITH TOMOSYNTHESIS AND CAD TECHNIQUE: Bilateral screening digital craniocaudal and mediolateral oblique mammograms were obtained. Bilateral screening digital breast tomosynthesis was performed. The images were evaluated with computer-aided detection. COMPARISON:  Previous exam(s). ACR Breast Density Category b: There are scattered areas of fibroglandular density. FINDINGS: In the left  breast, calcifications as well as possible distortion warrant further evaluation. In the right breast, no findings suspicious for malignancy. IMPRESSION: Further evaluation is suggested for calcifications as well as possible distortion in the left breast. RECOMMENDATION: Diagnostic mammogram of the left breast. (Code:[id number]) The patient will be contacted regarding the findings, and additional imaging will be scheduled. BI-RADS CATEGORY  0: Incomplete. Need additional imaging evaluation and/or prior mammograms for comparison. Electronically Signed   By: Marin Olp M.D.   On: 03/04/2022 15:01       IMPRESSION/PLAN: 1. 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 Dr. Lisbeth Renshaw discusses the pathology findings and reviews the nature of early stage breast disease. Dr. Ninfa Linden has recommended mastectomy after discussing options  with the patient which she is in agreement with. Dr. Chryl Heck plans Oncotype Dx score to determine a role for systemic therapy. While she has plans for mastectomy and has invasive and insitu disease, Dr. Lisbeth Renshaw discusses that we would base recommendations for postmastectomy radiation on her final pathology. We discussed criteria for postmastectomy radiation. We discussed the risks, benefits, short, and long term effects of radiotherapy, and will follow up with the results of her surgery. If she needed radiation Dr. Lisbeth Renshaw would anticipate 6 1/2 weeks of therapy and we would be happy to meet back to discuss this if needed about 4 weeks after surgery. 2. Possible genetic predisposition to malignancy. The patient is a candidate for genetic testing given her personal and family history. She will meet with our geneticist today in clinic. 3. History of Papillary Thyroid cancer. She follows with Dr. Buddy Duty in Endocrinology. They have been following thyroid uptake but recent biopsy was negative and plans are to repeat imaging in April 2024.  This will also be followed by medical oncology.   In a visit lasting 60 minutes, greater than 50% of the time was spent face to face reviewing her case, as well as in preparation of, discussing, and coordinating the patient's care.  The above documentation reflects my direct findings during this shared patient visit. Please see the separate note by Dr. Lisbeth Renshaw on this date for the remainder of the patient's plan of care.    Carola Rhine, [facility]    **Disclaimer: This note was dictated with voice recognition software. Similar sounding words can inadvertently be transcribed and this note may contain transcription errors which may not have been corrected upon publication of note.**

## 2022-03-23 ENCOUNTER — Inpatient Hospital Stay: Payer: BC Managed Care – PPO | Attending: Hematology and Oncology | Admitting: Hematology and Oncology

## 2022-03-23 ENCOUNTER — Ambulatory Visit
Admission: RE | Admit: 2022-03-23 | Discharge: 2022-03-23 | Disposition: A | Discharge status: Home / Self Care | Payer: BC Managed Care – PPO | Source: Ambulatory Visit | Attending: Radiation Oncology | Admitting: Radiation Oncology

## 2022-03-23 ENCOUNTER — Inpatient Hospital Stay (HOSPITAL_BASED_OUTPATIENT_CLINIC_OR_DEPARTMENT_OTHER): Payer: BC Managed Care – PPO | Admitting: Genetic Counselor

## 2022-03-23 ENCOUNTER — Encounter: Payer: Self-pay | Admitting: Physical Therapy

## 2022-03-23 ENCOUNTER — Encounter: Payer: Self-pay | Admitting: Genetic Counselor

## 2022-03-23 ENCOUNTER — Inpatient Hospital Stay: Payer: BC Managed Care – PPO

## 2022-03-23 ENCOUNTER — Inpatient Hospital Stay: Payer: BC Managed Care – PPO | Admitting: Licensed Clinical Social Worker

## 2022-03-23 ENCOUNTER — Encounter: Payer: Self-pay | Admitting: Hematology and Oncology

## 2022-03-23 ENCOUNTER — Encounter: Payer: Self-pay | Admitting: Radiology

## 2022-03-23 ENCOUNTER — Other Ambulatory Visit: Payer: Self-pay

## 2022-03-23 ENCOUNTER — Ambulatory Visit: Payer: BC Managed Care – PPO | Attending: Surgery | Admitting: Physical Therapy

## 2022-03-23 ENCOUNTER — Other Ambulatory Visit: Payer: Self-pay | Admitting: Surgery

## 2022-03-23 VITALS — BP 145/58 | HR 75 | Temp 97.7°F | Resp 18 | Ht 66.0 in | Wt 217.6 lb

## 2022-03-23 DIAGNOSIS — I10 Essential (primary) hypertension: Secondary | ICD-10-CM | POA: Insufficient documentation

## 2022-03-23 DIAGNOSIS — Z803 Family history of malignant neoplasm of breast: Secondary | ICD-10-CM | POA: Diagnosis not present

## 2022-03-23 DIAGNOSIS — Z170 Estrogen receptor positive status [ER+]: Secondary | ICD-10-CM

## 2022-03-23 DIAGNOSIS — C50412 Malignant neoplasm of upper-outer quadrant of left female breast: Secondary | ICD-10-CM

## 2022-03-23 DIAGNOSIS — D0512 Intraductal carcinoma in situ of left breast: Secondary | ICD-10-CM | POA: Diagnosis not present

## 2022-03-23 DIAGNOSIS — D693 Immune thrombocytopenic purpura: Secondary | ICD-10-CM | POA: Diagnosis not present

## 2022-03-23 DIAGNOSIS — R293 Abnormal posture: Secondary | ICD-10-CM | POA: Diagnosis not present

## 2022-03-23 DIAGNOSIS — Z853 Personal history of malignant neoplasm of breast: Secondary | ICD-10-CM | POA: Diagnosis not present

## 2022-03-23 DIAGNOSIS — C50912 Malignant neoplasm of unspecified site of left female breast: Secondary | ICD-10-CM | POA: Diagnosis not present

## 2022-03-23 DIAGNOSIS — Z8041 Family history of malignant neoplasm of ovary: Secondary | ICD-10-CM | POA: Diagnosis not present

## 2022-03-23 LAB — CMP (CANCER CENTER ONLY)
ALT: 15 U/L (ref 0–44)
AST: 17 U/L (ref 15–41)
Albumin: 4 g/dL (ref 3.5–5.0)
Alkaline Phosphatase: 77 U/L (ref 38–126)
Anion gap: 8 (ref 5–15)
BUN: 15 mg/dL (ref 8–23)
CO2: 30 mmol/L (ref 22–32)
Calcium: 9.8 mg/dL (ref 8.9–10.3)
Chloride: 103 mmol/L (ref 98–111)
Creatinine: 0.76 mg/dL (ref 0.44–1.00)
GFR, Estimated: >60 mL/min (ref >60)
Glucose, Bld: 143 mg/dL — ABNORMAL HIGH (ref 70–99)
Potassium: 3.6 mmol/L (ref 3.5–5.1)
Sodium: 141 mmol/L (ref 135–145)
Total Bilirubin: 0.4 mg/dL (ref 0.3–1.2)
Total Protein: 6.7 g/dL (ref 6.5–8.1)

## 2022-03-23 LAB — CBC WITH DIFFERENTIAL (CANCER CENTER ONLY)
Abs Immature Granulocytes: 0.03 10*3/uL (ref 0.00–0.07)
Basophils Absolute: 0.1 10*3/uL (ref 0.0–0.1)
Basophils Relative: 1 %
Eosinophils Absolute: 0.2 10*3/uL (ref 0.0–0.5)
Eosinophils Relative: 2 %
HCT: 40.5 % (ref 36.0–46.0)
Hemoglobin: 13.6 g/dL (ref 12.0–15.0)
Immature Granulocytes: 0 %
Lymphocytes Relative: 21 %
Lymphs Abs: 1.9 10*3/uL (ref 0.7–4.0)
MCH: 29.9 pg (ref 26.0–34.0)
MCHC: 33.6 g/dL (ref 30.0–36.0)
MCV: 89 fL (ref 80.0–100.0)
Monocytes Absolute: 0.6 10*3/uL (ref 0.1–1.0)
Monocytes Relative: 7 %
Neutro Abs: 6 10*3/uL (ref 1.7–7.7)
Neutrophils Relative %: 69 %
Platelet Count: 182 10*3/uL (ref 150–400)
RBC: 4.55 MIL/uL (ref 3.87–5.11)
RDW: 12.5 % (ref 11.5–15.5)
WBC Count: 8.8 10*3/uL (ref 4.0–10.5)
nRBC: 0 % (ref 0.0–0.2)

## 2022-03-23 LAB — GENETIC SCREENING ORDER

## 2022-03-23 NOTE — Research (Signed)
[employer] 2021-05 - Specimen Collection Study to Evaluate Biomarkers in Subjects with Cancer    03/23/2022  CONSENT INTRO:  Patient Lauren [name] was identified by Dr. Chryl Heck as a potential candidate for the above listed study.  This Clinical Research Coordinator met with Lauren [name], [id number], on 03/23/22 in a manner and location that ensures patient privacy to discuss participation in the above listed research study.  Patient is Accompanied by her daughter and husbant .  A copy of the informed consent document with embedded HIPAA language was provided to the patient.  Patient reads, speaks, and understands [place].   Patient was provided with the business card of this Coordinator and encouraged to contact the research team with any questions.  Approximately 15 minutes were spent with the patient reviewing the informed consent documents.  Patient was provided the option of taking informed consent documents home to review and was encouraged to review at their convenience with their support network, including other care providers.   After completion of consent introduction, it was discovered that patient has a potential recurrence of thyroid cancer (initially diagnosed in 2017). Due to this potential recurrence, patient is not eligible for the above mentioned study. Patient and her family were thanked for their time and consideration of the above mentioned study.   Carol Ada, RT(R)(T) Clinical Research Coordinator

## 2022-03-23 NOTE — Progress Notes (Signed)
Lauren [name] CONSULT NOTE  Patient Care Team: Shirlean Mylar, MD as PCP - General (Family Medicine) Meriam Sprague, MD as PCP - Cardiology (Cardiology) Means, Raliegh Ip, RN as Registered Nurse (General Practice) Abigail Miyamoto, MD as Consulting Physician (General Surgery) Rachel Moulds, MD as Consulting Physician (Hematology and Oncology) Dorothy Puffer, MD as Consulting Physician (Radiation Oncology) Pershing Proud, RN as Oncology Nurse Navigator Donnelly Angelica, RN as Oncology Nurse Navigator  CHIEF COMPLAINTS/PURPOSE OF CONSULTATION:  Newly diagnosed breast cancer  HISTORY OF PRESENTING ILLNESS:  Lauren [name] 67 y.o. female is here because of recent diagnosis of left breast cancer.  I reviewed her records extensively and collaborated the history with the patient.  SUMMARY OF ONCOLOGIC HISTORY: Oncology History  Malignant neoplasm of upper-outer quadrant of left breast in female, estrogen receptor positive (HCC)  03/04/2022 Mammogram   Screening mammogram showed calcs and possible distortion in the left breast. Diag mammogram showed suspicious calcs and non mass finding in the 2 0 clock location of the left breast warranting further tissue diagnosis. No left axillary adenopathy   03/10/2022 Breast US   Breast ultrasound showed suspicious calcs and non-mass finding in the 2:00 location of the left breast with no evidence of left axillary adenopathy.  The area measured approximately 4.9 cm in largest dimension.   03/21/2022 Initial Diagnosis   Malignant neoplasm of upper-outer quadrant of left breast in female, estrogen receptor positive (HCC)   03/23/2022 Cancer Staging   Staging form: Breast, AJCC 8th Edition - Clinical: Stage IB (cT2, cN0, cM0, G2, ER+, PR+, HER2-) - Signed by Ronny Bacon, PA-C on 03/23/2022 Stage prefix: Initial diagnosis Method of lymph node assessment: Clinical Histologic grading system: 3 grade system   03/25/2022 Pathology Results    Pathology showed grade 2 IDC, ER positive, PR positive, her 2 neg, Ki 67 30%    Lauren [name] is here with her husband and her daughter. She has underlying history of HTN, ITP. She is currently working as an Production designer, theatre/television/film. She has one child, no prolonged use of HRT or OCP.  History of breast cancer.  Family history of breast cancer in paternal aunt in her 46s. Rest of the pertinent 10 point ROS reviewed and negative.  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   not current problem, no hematologist    SURGICAL HISTORY: Past Surgical History:  Procedure Laterality Date   ABDOMINAL HYSTERECTOMY  11-18-1998   partial   BREAST BIOPSY Left 03/15/2022   [place] LT BREAST BX W LOC DEV 1ST LESION IMG BX SPEC US GUIDE 03/15/2022 [facility]   BREAST BIOPSY Left 03/15/2022   [place] LT BREAST BX W LOC DEV EA ADD LESION IMG BX SPEC US GUIDE 03/15/2022 [facility]   CHOLECYSTECTOMY  08/02/2011   Procedure: LAPAROSCOPIC CHOLECYSTECTOMY WITH INTRAOPERATIVE CHOLANGIOGRAM;  Surgeon: Valarie Merino, MD;  Location: [facility];  Service: General;  Laterality: N/A;   LAPAROSCOPIC GASTRIC BANDING  05/19/2008   LUMBAR DISC SURGERY  03/21/1996   THYROIDECTOMY N/A 09/08/2015   Procedure: TOTAL THYROIDECTOMY;  Surgeon: Darnell Level, MD;  Location: [facility];  Service: General;  Laterality: N/A;   TONSILLECTOMY      SOCIAL HISTORY: Social History   Socioeconomic History   Marital status: Married    Spouse name: Not on file   Number of children: Not  on file   Years of education: Not on file   Highest education level: Not on file  Occupational History   Not on file  Tobacco Use   Smoking status: Never   Smokeless tobacco: Never  Substance and Sexual Activity   Alcohol use: No   Drug use: No   Sexual activity: Yes  Other Topics Concern   Not on file  Social History  Narrative   Not on file   Social Determinants of Health   Financial Resource Strain: Low Risk  (03/23/2022)   Overall Financial Resource Strain (CARDIA)    Difficulty of Paying Living Expenses: Not hard at all  Food Insecurity: No Food Insecurity (03/23/2022)   Hunger Vital Sign    Worried About Running Out of Food in the Last Year: Never true    Ran Out of Food in the Last Year: Never true  Transportation Needs: No Transportation Needs (03/23/2022)   PRAPARE - Administrator, Civil Service (Medical): No    Lack of Transportation (Non-Medical): No  Physical Activity: Not on file  Stress: Not on file  Social Connections: Not on file  Intimate Partner Violence: Not on file    FAMILY HISTORY: Family History  Problem Relation Age of Onset   Colon polyps Mother    Heart disease Mother    Diabetes Father    Heart disease Father    Breast cancer Maternal Aunt 31 - 69   Ovarian cancer Paternal Aunt    Bladder Cancer Paternal Uncle        paternal half-uncle   Breast cancer Cousin 42 - 51    ALLERGIES:  has No Known Allergies.  MEDICATIONS:  Current Outpatient Medications  Medication Sig Dispense Refill   ACCU-CHEK GUIDE test strip check sugar 3x daily In Vitro three times daily for 90 days     Accu-Chek Softclix Lancets lancets USE 1 TO CHECK GLUCOSE THREE TIMES DAILY     aspirin 81 MG tablet Take 81 mg by mouth daily.      benazepril-hydrochlorthiazide (LOTENSIN HCT) 20-25 MG tablet Take 1 tablet by mouth daily.     Blood Glucose Monitoring Suppl (ACCU-CHEK GUIDE) w/Device KIT check glucose daily and as needed for 365 days     Cholecalciferol (VITAMIN D3) 25 MCG (1000 UT) CAPS      Cyanocobalamin (B-12) 500 MCG SUBL Place 1 tablet under the tongue daily.     docusate sodium (COLACE) 100 MG capsule Take 100 mg by mouth daily.     EUTHYROX 175 MCG tablet Take 175 mcg by mouth daily before breakfast.     metFORMIN (GLUCOPHAGE) 500 MG tablet 1 Tablet(s) By Mouth Every  Evening     metoprolol succinate (TOPROL XL) 50 MG 24 hr tablet Take 1 tablet (50 mg total) by mouth daily. Take with or immediately following a meal. 90 tablet 1   nystatin cream (MYCOSTATIN) 1 application Externally Twice a day as needed for 30 days     rosuvastatin (CRESTOR) 40 MG tablet Take 1 tablet (40 mg total) by mouth daily. 90 tablet 1   terbinafine (LAMISIL) 250 MG tablet      triamcinolone (KENALOG) 0.025 % cream 1 application Externally Once a day as neeed     No current facility-administered medications for this visit.   REVIEW OF SYSTEMS:   Constitutional: Denies fevers, chills or abnormal night sweats Eyes: Denies blurriness of vision, double vision or watery eyes Ears, nose, mouth, throat, and face: Denies mucositis  or sore throat Respiratory: Denies cough, dyspnea or wheezes Cardiovascular: Denies palpitation, chest discomfort or lower extremity swelling Gastrointestinal:  Denies nausea, heartburn or change in bowel habits Skin: Denies abnormal skin rashes Lymphatics: Denies new lymphadenopathy or easy bruising Neurological:Denies numbness, tingling or new weaknesses Behavioral/Psych: Mood is stable, no new changes  Breast: Denies any palpable lumps or discharge All other systems were reviewed with the patient and are negative.  PHYSICAL EXAMINATION: ECOG PERFORMANCE STATUS: 0 - Asymptomatic  Vitals:   03/23/22 0845  BP: (!) 145/58  Pulse: 75  Resp: 18  Temp: 97.7 F (36.5 C)  SpO2: 99%   Filed Weights   03/23/22 0845  Weight: 217 lb 9.6 oz (98.7 kg)   GENERAL:alert, no distress and comfortable SKIN: skin color, texture, turgor are normal, no rashes or significant lesions EYES: normal, conjunctiva are pink and non-injected, sclera clear OROPHARYNX:no exudate, no erythema and lips, buccal mucosa, and tongue normal  NECK: supple, thyroid normal size, non-tender, without nodularity LYMPH:  no palpable lymphadenopathy in the cervical, axillary LUNGS: clear to  auscultation and percussion with normal breathing effort HEART: regular rate & rhythm and no murmurs and no lower extremity edema ABDOMEN:abdomen soft, non-tender and normal bowel sounds Musculoskeletal:no cyanosis of digits and no clubbing  PSYCH: alert & oriented x 3 with fluent speech NEURO: no focal motor/sensory deficits BREAST: No palpable nodules in breast. No palpable axillary or supraclavicular lymphadenopathy   LABORATORY DATA:  I have reviewed the data as listed Lab Results  Component Value Date   WBC 8.8 03/23/2022   HGB 13.6 03/23/2022   HCT 40.5 03/23/2022   MCV 89.0 03/23/2022   PLT 182 03/23/2022   Lab Results  Component Value Date   NA 141 03/23/2022   K 3.6 03/23/2022   CL 103 03/23/2022   CO2 30 03/23/2022    RADIOGRAPHIC STUDIES: I have personally reviewed the radiological reports and agreed with the findings in the report.  ASSESSMENT AND PLAN:  Malignant neoplasm of upper-outer quadrant of left breast in female, estrogen receptor positive (HCC) This is a very pleasant 67 yr old female patient with PMH significant for HTN, obesity, hyperlipidemia referred to breast MDC for recommendations.  Patient arrived to the appointment today with her daughter as well as her husband.  Given the large area of calcs and non-mass finding on mammogram as well as ultrasound, she will be proceeding with mastectomy based on her discussion with the surgeon.  After mastectomy, we will once again review final pathology and see if she will be a candidate for Oncotype.  She understands the following details about Oncotype.  We have discussed about Oncotype Dx score which is a well validated prognostic scoring system which can predict outcome with endocrine therapy alone and whether chemotherapy reduces recurrence.  Typically in patients with ER positive cancers that are node negative if the RS score is high typically greater than or equal to 26, chemotherapy is recommended.  In women  with intermediate recurrence score younger than 50, there can still be some role for chemotherapy in addition to endocrine therapy especially if the recurrence score is between 21-25. If chemotherapy is needed, this will precede radiation and then after radiation she will continue on antiestrogen therapy.  We have also discussed about role of antiestrogen therapy after completion of radiation if this is indeed recommended.  We have discussed options for antiestrogen therapy today  With regards to Tamoxifen, we discussed that this is a SERM, selective estrogen receptor modulator.  We discussed mechanism of action of Tamoxifen, adverse effects on Tamoxifen including but not limited to post menopausal symptoms, increased risk of DVT/PE, increased risk of endometrial cancer, questionable cataracts with long term use and increased risk of cardiovascular events in the study which was not statistically significant. A benefit from Tamoxifen would be improvement in bone density. With regards to aromatase inhibitors, we discussed mechanism of action, adverse effects including but not limited to post menopausal symptoms, arthralgias, myalgias, increased risk of cardiovascular events and bone loss.   She had several excellent questions and all questions were answered to the best of my knowledge.  She will return to clinic after surgery to review final pathology and to discuss additional recommendations.        Total time spent: 60 minutes including history, physical exam, review of records, counseling and coordination of care, breast MDC discussion. All questions were answered. The patient knows to call the clinic with any problems, questions or concerns.    Rachel Moulds, MD 03/24/22

## 2022-03-23 NOTE — Assessment & Plan Note (Signed)
This is a very pleasant 67 yr old female patient with PMH significant for HTN, obesity, hyperlipidemia referred to breast [facility] for recommendations.

## 2022-03-23 NOTE — Therapy (Signed)
OUTPATIENT PHYSICAL THERAPY BREAST CANCER BASELINE EVALUATION   Patient Name: Lauren [name] MRN: [id number] DOB:Sep 28, 1955, 67 y.o., female Today's Date: 03/23/2022  END OF SESSION:  PT End of Session - 03/23/22 1028     Visit Number 1    Number of Visits 2    Date for PT Re-Evaluation 05/18/22    PT Start Time 1031    PT Stop Time 1055   Also saw pt from 2197 to 1141 for a total of 30 minutes   PT Time Calculation (min) 24 min    Activity Tolerance Patient tolerated treatment well    Behavior During Therapy [facility] for tasks assessed/performed             Past Medical History:  Diagnosis Date   Breast cancer ([place])    Hyperlipidemia    Hypertension    Idiopathic thrombocytopenic purpura (ITP) ([place]) 02/14/1985   not current problem, no hematologist   Past Surgical History:  Procedure Laterality Date   ABDOMINAL HYSTERECTOMY  11-18-1998   partial   BREAST BIOPSY Left 03/15/2022   [place] LT BREAST BX W LOC DEV 1ST LESION IMG BX SPEC US GUIDE 03/15/2022 [facility]   BREAST BIOPSY Left 03/15/2022   [place] LT BREAST BX W LOC DEV EA ADD LESION IMG BX [place] US GUIDE 03/15/2022 [facility]   CHOLECYSTECTOMY  08/02/2011   Procedure: LAPAROSCOPIC CHOLECYSTECTOMY WITH INTRAOPERATIVE CHOLANGIOGRAM;  Surgeon: Pedro Earls, MD;  Location: [facility];  Service: General;  Laterality: N/A;   LAPAROSCOPIC GASTRIC BANDING  05/19/2008   LUMBAR [place] SURGERY  03/21/1996   THYROIDECTOMY N/A 09/08/2015   Procedure: TOTAL THYROIDECTOMY;  Surgeon: Armandina Gemma, MD;  Location: [facility];  Service: General;  Laterality: N/A;   TONSILLECTOMY     Patient Active Problem List   Diagnosis Date Noted   Malignant neoplasm of upper-outer quadrant of left breast in female, estrogen receptor positive ([place]) 03/21/2022   Toxic multinodular goiter 09/08/2015   Hyperthyroidism 09/04/2015   Multinodular goiter 04/29/2011   Chronic cholecystitis 04/29/2011   Lapband APS April 2010 03/18/2011   GIST  (gastrointestinal stromal tumor), non-malignant-1 cm incidental finding at time of banding 03/18/2011   REFERRING PROVIDER: Dr. Coralie Keens  REFERRING DIAG: Left breast cancer  THERAPY DIAG:  Malignant neoplasm of upper-outer quadrant of left breast in female, estrogen receptor positive ([place])  Abnormal posture  Rationale for Evaluation and Treatment: Rehabilitation  ONSET DATE: 03/04/2022  SUBJECTIVE:  SUBJECTIVE STATEMENT: Patient reports she is here today to be seen by her medical team for her newly diagnosed right breast cancer.   PERTINENT HISTORY:  Patient was diagnosed on left breast cancer with left grade 2 invasive ductal carcinoma breast cancer. It measures 9.2 cm of calcifications and is located in the upper outer quadrant. It is ER/PR positive and HER2 negative with a Ki67 of 30%.   PATIENT GOALS:   reduce lymphedema risk and learn post op HEP.   PAIN:  Are you having pain? Yes - she has chronic bilateral feet, knee, and hip pain which varies dependent on activity.  PRECAUTIONS: Active CA   HAND DOMINANCE: left  WEIGHT BEARING RESTRICTIONS: No  FALLS:  Has patient fallen in last 6 months? 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  LEISURE: She does not exercise   PRIOR LEVEL OF FUNCTION: Independent   OBJECTIVE:  COGNITION: Overall cognitive status: Within functional limits for tasks assessed    POSTURE:  Forward head and rounded shoulders posture  UPPER EXTREMITY AROM/PROM:  A/PROM RIGHT   eval   Shoulder extension 42  Shoulder flexion 121  Shoulder abduction 122  Shoulder internal  rotation 74  Shoulder external rotation 74    (Blank rows = not tested)  A/PROM LEFT   eval  Shoulder extension 58  Shoulder flexion 117  Shoulder abduction 123  Shoulder internal rotation 75  Shoulder external rotation 79    (Blank rows = not tested)  CERVICAL AROM: All within normal limits:    Percent limited  Flexion WNL  Extension 50% limited  Right lateral flexion WNL  Left lateral flexion WNL  Right rotation WNL  Left rotation WNL    UPPER EXTREMITY STRENGTH: WFL  LYMPHEDEMA ASSESSMENTS:   LANDMARK RIGHT   eval  10 cm proximal to olecranon process 37.5  Olecranon process 30.1  10 cm proximal to ulnar styloid process 26.7  Just proximal to ulnar styloid process 19.3  Across hand at thumb web space 19.1  At base of 2nd digit 6.3  (Blank rows = not tested)  LANDMARK LEFT   eval  10 cm proximal to olecranon process 42.2  Olecranon process 29.2  10 cm proximal to ulnar styloid process 26.5  Just proximal to ulnar styloid process 19.4  Across hand at thumb web space 19.6  At base of 2nd digit 6.4   (Blank rows = not tested)  L-DEX LYMPHEDEMA SCREENING:  The patient was assessed using the L-Dex machine today to produce a lymphedema index baseline score. The patient will be reassessed on a regular basis (typically every 3 months) to obtain new L-Dex scores. If the score is > 6.5 points away from his/her baseline score indicating onset of subclinical lymphedema, it will be recommended to wear a compression garment for 4 weeks, 12 hours per day and then be reassessed. If the score continues to be > 6.5 points from baseline at reassessment, we will initiate lymphedema treatment. Assessing in this manner has a 95% rate of preventing clinically significant lymphedema.   L-DEX FLOWSHEETS - 03/23/22 1000       L-DEX LYMPHEDEMA SCREENING   Measurement Type Unilateral    L-DEX MEASUREMENT EXTREMITY Upper Extremity    POSITION  Standing    DOMINANT SIDE Right    At  Risk Side Left    BASELINE  SCORE (UNILATERAL) -3.2             QUICK DASH SURVEY:  Katina Dung - 03/23/22 0001     Open a tight or new jar No difficulty    Do heavy household chores (wash walls, wash floors) Severe difficulty    Carry a shopping bag or briefcase No difficulty    Wash your back Moderate difficulty    Use a knife to cut food No difficulty    Recreational activities in which you take some force or impact through your arm, shoulder, or hand (golf, hammering, tennis) Unable    During the past week, to what extent has your arm, shoulder or hand problem interfered with your normal social activities with family, friends, neighbors, or groups? Slightly    During the past week, to what extent has your arm, shoulder or hand problem limited your work or other regular daily activities Slightly    Arm, shoulder, or hand pain. None    Tingling (pins and needles) in your arm, shoulder, or hand None    Difficulty Sleeping No difficulty    DASH Score 25 %              PATIENT EDUCATION:  Education details: Lymphedema risk reduction and post op shoulder/posture HEP Person educated: Patient Education method: Explanation, Demonstration, Handout Education comprehension: Patient verbalized understanding and returned demonstration  HOME EXERCISE PROGRAM: Patient was instructed today in a home exercise program today for post op shoulder range of motion. These included active assist shoulder flexion in sitting, scapular retraction, wall walking with shoulder abduction, and hands behind head external rotation.  She was encouraged to do these twice a day, holding 3 seconds and repeating 5 times when permitted by her physician.   ASSESSMENT:  CLINICAL IMPRESSION: Patient was diagnosed on left breast cancer with left grade 2 invasive ductal carcinoma breast cancer. It measures 9.2 cm of calcifications and is located in the upper outer quadrant. It is ER/PR positive and HER2 negative  with a Ki67 of 30%. Her multidisciplinary medical team met prior to her assessments to determine a recommended treatment plan. She is planning to have a left mastectomy and sentinel node biopsy followed by Oncotype testing and anti-estrogen therapy. She will benefit from a post op PT reassessment to determine needs and from L-Dex screens every 3 months for 2 years to detect subclinical lymphedema.  Pt will benefit from skilled therapeutic intervention to improve on the following deficits: Decreased knowledge of precautions, impaired UE functional use, pain, decreased ROM, postural dysfunction.   PT treatment/interventions: ADL/self-care home management, pt/family education, therapeutic exercise  REHAB POTENTIAL: Excellent  CLINICAL DECISION MAKING: Stable/uncomplicated  EVALUATION COMPLEXITY: Low   GOALS: Goals reviewed with patient? YES  LONG TERM GOALS: (STG=LTG)    Name Target Date Goal status  1 Pt will be able to verbalize understanding of pertinent lymphedema risk reduction practices relevant to her dx specifically related to skin care.  Baseline:  No knowledge 03/23/2022 Achieved at eval  2 Pt will be able to return demo and/or verbalize understanding of the post op HEP related to regaining shoulder ROM. Baseline:  No knowledge 03/23/2022 Achieved at eval  3 Pt will be able to verbalize understanding of the importance of attending the post op After Breast CA Class for further lymphedema risk reduction education and therapeutic exercise.  Baseline:  No knowledge 03/23/2022 Achieved at eval  4 Pt will demo she has regained full shoulder ROM and function post operatively compared  to baselines.  Baseline: See objective measurements taken today. 05/18/2022     PLAN:  PT FREQUENCY/DURATION: EVAL and 1 follow up appointment.   PLAN FOR NEXT SESSION: will reassess 3-4 weeks post op to determine needs.   Patient will follow up at outpatient cancer rehab 3-4 weeks following surgery.  If the  patient requires physical therapy at that time, a specific plan will be dictated and sent to the referring physician for approval. The patient was educated today on appropriate basic range of motion exercises to begin post operatively and the importance of attending the After Breast Cancer class following surgery.  Patient was educated today on lymphedema risk reduction practices as it pertains to recommendations that will benefit the patient immediately following surgery.  She verbalized good understanding.    Physical Therapy Information for After Breast Cancer Surgery/Treatment:  Lymphedema is a swelling condition that you may be at risk for in your arm if you have lymph nodes removed from the armpit area.  After a sentinel node biopsy, the risk is approximately 5-9% and is higher after an axillary node dissection.  There is treatment available for this condition and it is not life-threatening.  Contact your physician or physical therapist with concerns. You may begin the 4 shoulder/posture exercises (see additional sheet) when permitted by your physician (typically a week after surgery).  If you have drains, you may need to wait until those are removed before beginning range of motion exercises.  A general recommendation is to not lift your arms above shoulder height until drains are removed.  These exercises should be done to your tolerance and gently.  This is not a "no pain/no gain" type of recovery so listen to your body and stretch into the range of motion that you can tolerate, stopping if you have pain.  If you are having immediate reconstruction, ask your plastic surgeon about doing exercises as he or she may want you to wait. We encourage you to attend the free one time ABC (After Breast Cancer) class offered by [facility].  You will learn information related to lymphedema risk, prevention and treatment and additional exercises to regain mobility following surgery.  You  can call [phone] for more information.  This is offered the 1st and 3rd Monday of each month.  You only attend the class one time. While undergoing any medical procedure or treatment, try to avoid blood pressure being taken or needle sticks from occurring on the arm on the side of cancer.   This recommendation begins after surgery and continues for the rest of your life.  This may help reduce your risk of getting lymphedema (swelling in your arm). An excellent resource for those seeking information on lymphedema is the National Lymphedema Network's web site. It can be accessed at [place].org If you notice swelling in your hand, arm or breast at any time following surgery (even if it is many years from now), please contact your doctor or physical therapist to discuss this.  Lymphedema can be treated at any time but it is easier for you if it is treated early on.  If you feel like your shoulder motion is not returning to normal in a reasonable amount of time, please contact your surgeon or physical therapist.  [place] [phone]. [address], [place] [address]  ABC CLASS After Breast Cancer Class  After Breast Cancer Class is a specially designed exercise class to assist you in  a safe recover after having breast cancer surgery.  In this class you will learn how to get back to full function whether your drains were just removed or if you had surgery a month ago.  This one-time class is held the 1st and 3rd Monday of every month from 11:00 a.m. until 12:00 noon virtually.  This class is FREE and space is limited. For more information or to register for the next available class, call [phone].  Class Goals  Understand specific stretches to improve the flexibility of you chest and shoulder. Learn ways to safely strengthen your upper body and improve your posture. Understand the warning signs of infection and why you may be at risk  for an arm infection. Learn about Lymphedema and prevention.  ** You do not attend this class until after surgery.  Drains must be removed to participate  Patient was instructed today in a home exercise program today for post op shoulder range of motion. These included active assist shoulder flexion in sitting, scapular retraction, wall walking with shoulder abduction, and hands behind head external rotation.  She was encouraged to do these twice a day, holding 3 seconds and repeating 5 times when permitted by her physician.  Annia Friendly, [place] 03/23/22 11:44 AM

## 2022-03-23 NOTE — Progress Notes (Signed)
[facility]  Initial Assessment   ICESIS RENN is a 67 y.o. year old female accompanied by husband Lanny Hurst) and daughter Lexine Baton). Clinical Social Work was referred by  [facility]  for assessment of psychosocial needs.   SDOH (Social Determinants of Health) assessments performed: Yes SDOH Interventions    Flowsheet Row Clinical Support from 03/23/2022 in [place] at [facility]  SDOH Interventions   Food Insecurity Interventions Intervention Not Indicated  Housing Interventions Intervention Not Indicated  Transportation Interventions Intervention Not Indicated  Utilities Interventions Intervention Not Indicated  Financial Strain Interventions Intervention Not Indicated       SDOH Screenings   Food Insecurity: No Food Insecurity (03/23/2022)  Housing: Low Risk  (03/23/2022)  Transportation Needs: No Transportation Needs (03/23/2022)  Utilities: Not At Risk (03/23/2022)  Financial Resource Strain: Low Risk  (03/23/2022)  Tobacco Use: Low Risk  (03/23/2022)     Distress Screen completed: Yes    03/23/2022    1:35 PM  ONCBCN DISTRESS SCREENING  Screening Type Initial Screening  Distress experienced in past week (1-10) 3  Emotional problem type Nervousness/Anxiety      Family/Social Information:  Housing Arrangement: patient lives with husband Family members/support persons in your life? 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  Income source: Employment Financial concerns: No Type of concern: None Food access concerns: no Religious or spiritual practice: Not known Services Currently in place:  [employer]  Coping/ Adjustment to diagnosis: Patient understands treatment plan and what happens next? yes, feeling some relief after speaking with the medical team Patient reported stressors: Anxiety/ nervousness Hopes  and/or priorities: be able to still engage with her grandkids and see them in basketball, gymnastics Patient enjoys time with family/ friends Current coping skills/ strengths: Capable of independent living , Communication skills , and Supportive family/friends     SUMMARY: Current SDOH Barriers:  No significant barriers noted today  Clinical Social Work Clinical Goal(s):  No clinical social work goals at this time  Interventions: Discussed common feeling and emotions when being diagnosed with cancer, and the importance of support during treatment Informed patient of the support team roles and support services at [facility] Provided [place] contact information and encouraged patient to call with any questions or concerns   Follow Up Plan: Patient will contact CSW with any support or resource needs Patient verbalizes understanding of plan: Yes    Naithan Delage E Kerrigan Gombos, LCSW

## 2022-03-23 NOTE — Progress Notes (Unsigned)
REFERRING PROVIDER: Benay Pike, MD  PRIMARY PROVIDER:  Maurice Small, MD  PRIMARY REASON FOR VISIT:  1. Malignant neoplasm of upper-outer quadrant of left breast in female, estrogen receptor positive ([place])   2. Family history of breast cancer   3. Family history of ovarian cancer     HISTORY OF PRESENT ILLNESS:   Lauren [name], a 67 y.o. female, was seen for a [place] cancer genetics consultation at the request of Dr. Chryl Heck due to a personal and family history of cancer.  Lauren [name] presents to clinic today to discuss the possibility of a hereditary predisposition to cancer, to discuss genetic testing, and to further clarify her future cancer risks, as well as potential cancer risks for family members.   In February 2024, at the age of 53, Lauren [name] was diagnosed with invasive ductal carcinoma of the left breast (ER/PR positive, HER2 negative). 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  CANCER HISTORY:  Oncology History  Malignant neoplasm of upper-outer quadrant of left breast in female, estrogen receptor positive ([place])  03/21/2022 Initial Diagnosis   Malignant neoplasm of upper-outer quadrant of left breast in female, estrogen receptor positive ([place])   03/23/2022 Cancer Staging   Staging form: Breast, AJCC 8th Edition - Clinical: Stage IB (cT2, cN0, cM0, G2, ER+, PR+, HER2-) - Signed by Hayden Pedro, PA-C on 03/23/2022 Stage prefix: Initial diagnosis Method of lymph node assessment: Clinical Histologic grading system: 3 grade system     RISK FACTORS:  Menarche was at age 22.  First live birth at age 44.  OCP use for approximately 4-5 years.  Ovaries intact: yes.  Uterus intact: no.  Menopausal status: postmenopausal.  HRT use: 0 years. Colonoscopy: yes;  2022 . Mammogram within the last year: yes.  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   not current problem, no hematologist    Past Surgical History:  Procedure Laterality Date   ABDOMINAL HYSTERECTOMY  11-18-1998   partial   BREAST BIOPSY Left 03/15/2022   [place] LT BREAST BX W LOC DEV 1ST LESION IMG BX SPEC US GUIDE 03/15/2022 [facility]   BREAST BIOPSY Left 03/15/2022   [place] LT BREAST BX W LOC DEV EA ADD LESION IMG BX [place] US GUIDE 03/15/2022 [facility]   CHOLECYSTECTOMY  08/02/2011   Procedure: LAPAROSCOPIC CHOLECYSTECTOMY WITH INTRAOPERATIVE CHOLANGIOGRAM;  Surgeon: Pedro Earls, MD;  Location: [facility];  Service: General;  Laterality: N/A;   LAPAROSCOPIC GASTRIC BANDING  05/19/2008   LUMBAR [place] SURGERY  03/21/1996   THYROIDECTOMY N/A 09/08/2015   Procedure: TOTAL THYROIDECTOMY;  Surgeon: Armandina Gemma, MD;  Location: [facility];  Service: General;  Laterality: N/A;   TONSILLECTOMY      Social History   Socioeconomic History   Marital status: Married    Spouse name: Not on file   Number of children: Not on file   Years of education: Not on file   Highest education level: Not on file  Occupational History   Not on file  Tobacco Use   Smoking status: Never   Smokeless tobacco: Never  Substance and Sexual Activity   Alcohol use: No   Drug use: No   Sexual activity: Yes  Other Topics Concern   Not on file  Social History Narrative   Not on  file   Social Determinants of Health   Financial Resource Strain: Low Risk  (03/23/2022)   Overall Financial Resource Strain (CARDIA)    Difficulty of Paying Living Expenses: Not hard at all  Food Insecurity: No Food Insecurity (03/23/2022)   Hunger Vital Sign    Worried About Running Out of Food in the Last Year: Never true    Ran Out of Food in the Last Year: Never true  Transportation Needs: No Transportation Needs (03/23/2022)   PRAPARE - Hydrologist (Medical): No    Lack of Transportation (Non-Medical): No  Physical Activity:  Not on file  Stress: Not on file  Social Connections: Not on file     FAMILY HISTORY:  We obtained a detailed, 4-generation family history.  Significant diagnoses are listed below: Family History  Problem Relation Age of Onset   Colon polyps Mother    Heart disease Mother    Diabetes Father    Heart disease Father    Breast cancer Maternal Aunt 60 - 69   Ovarian cancer Paternal Aunt    Bladder Cancer Paternal Uncle        paternal half-uncle   Breast cancer Cousin 53 - 30     Lauren [name]'s maternal aunt was diagnosed with breast cancer in her 24s, she is deceased. Her paternal aunt was diagnosed with ovarian cancer, she is deceased. This paternal aunt's daughter was diagnosed with breast cancer in her 36s. Lauren [name] is unaware of previous family history of genetic testing for hereditary cancer risks. There is no reported Ashkenazi Jewish ancestry.  GENETIC COUNSELING ASSESSMENT: Lauren [name] is a 68 y.o. female with a personal and family history of cancer which is somewhat suggestive of a hereditary predisposition to cancer. We, therefore, discussed and recommended the following at today's visit.   DISCUSSION: We discussed that 5 - 10% of cancer is hereditary, with most cases of breast cancer associated with BRCA1/2.  There are other genes that can be associated with hereditary breast cancer syndromes.  We discussed that testing is beneficial for several reasons including knowing how to follow individuals after completing their treatment, identifying whether potential treatment options would be beneficial, and understanding if other family members could be at risk for cancer and allowing them to undergo genetic testing.   We reviewed the characteristics, features and inheritance patterns of hereditary cancer syndromes. We also discussed genetic testing, including the appropriate family members to test, the process of testing, insurance coverage and turn-around-time for results. We  discussed the implications of a negative, positive, carrier and/or variant of uncertain significant result. We recommended Lauren [name] pursue genetic testing for a panel that includes genes associated with breast, ovarian, thyroid, and bladder cancer.   Lauren [name] elected to have [facility] Multi-Cancer Panel. The Multi-Cancer + RNA Panel offered by [facility] includes sequencing and/or deletion/duplication analysis of the following 70 genes:  AIP*, ALK, APC*, ATM*, AXIN2*, BAP1*, BARD1*, BLM*, BMPR1A*, BRCA1*, BRCA2*, BRIP1*, CDC73*, CDH1*, CDK4, CDKN1B*, CDKN2A, CHEK2*, CTNNA1*, DICER1*, EPCAM (del/dup only), EGFR, FH*, FLCN*, GREM1 (promoter dup only), HOXB13, KIT, LZTR1, MAX*, MBD4, MEN1*, MET, MITF, MLH1*, MSH2*, MSH3*, MSH6*, MUTYH*, NF1*, NF2*, NTHL1*, PALB2*, PDGFRA, PMS2*, POLD1*, POLE*, POT1*, PRKAR1A*, PTCH1*, PTEN*, RAD51C*, RAD51D*, RB1*, RET, SDHA* (sequencing only), SDHAF2*, SDHB*, SDHC*, SDHD*, SMAD4*, SMARCA4*, SMARCB1*, SMARCE1*, STK11*, SUFU*, TMEM127*, TP53*, TSC1*, TSC2*, VHL*. RNA analysis is performed for * genes.   Based on Lauren [name]'s personal and family history of cancer, she meets medical criteria for  genetic testing. Despite that she meets criteria, she may still have an out of pocket cost. We discussed that if her out of pocket cost for testing is over $100, the laboratory will call and confirm whether she wants to proceed with testing.  If the out of pocket cost of testing is less than $100 she will be billed by the genetic testing laboratory.   PLAN: After considering the risks, benefits, and limitations, Lauren [name] provided informed consent to pursue genetic testing and the blood sample was sent to [facility] for analysis of the Multi-Cancer Panel. Results should be available within approximately 2-3 weeks' time, at which point they will be disclosed by telephone to Lauren [name], as will any additional recommendations warranted by these results. Lauren [name] will  receive a summary of her genetic counseling visit and a copy of her results once available. This information will also be available in Epic.   Lauren [name] questions were answered to her satisfaction today. Our contact information was provided should additional questions or concerns arise. Thank you for the referral and allowing [place] to share in the care of your patient.   Lucille Passy, MS, [facility] Genetic Counselor [email or web address] (P) [phone]  The patient was seen for a total of 20 minutes in face-to-face genetic counseling.  The patient brought her husband and daughter. Drs. Lindi Adie and/or Burr Medico were available to discuss this case as needed.   _______________________________________________________________________ For Office Staff:  Number of people involved in session: 3 Was an Intern/ student involved with case: no

## 2022-03-25 ENCOUNTER — Ambulatory Visit: Payer: BC Managed Care – PPO | Attending: Cardiology

## 2022-03-25 DIAGNOSIS — E785 Hyperlipidemia, unspecified: Secondary | ICD-10-CM

## 2022-03-25 DIAGNOSIS — I700 Atherosclerosis of aorta: Secondary | ICD-10-CM

## 2022-03-25 DIAGNOSIS — E119 Type 2 diabetes mellitus without complications: Secondary | ICD-10-CM

## 2022-03-25 DIAGNOSIS — Z79899 Other long term (current) drug therapy: Secondary | ICD-10-CM | POA: Diagnosis not present

## 2022-03-25 DIAGNOSIS — I10 Essential (primary) hypertension: Secondary | ICD-10-CM | POA: Diagnosis not present

## 2022-03-26 LAB — LIPID PANEL
Chol/HDL Ratio: 3.1 ratio (ref 0.0–4.4)
Cholesterol, Total: 101 mg/dL (ref 100–199)
HDL: 33 mg/dL — ABNORMAL LOW (ref >39)
LDL Chol Calc (NIH): 44 mg/dL (ref 0–99)
Triglycerides: 137 mg/dL (ref 0–149)
VLDL Cholesterol Cal: 24 mg/dL (ref 5–40)

## 2022-03-26 LAB — ALT: ALT: 14 IU/L (ref 0–32)

## 2022-03-28 ENCOUNTER — Telehealth: Payer: Self-pay

## 2022-03-28 NOTE — Telephone Encounter (Signed)
Hello Tessa,  We received a surgical request for left mastectomy with sentinel node biopsy for Ms. Benefield.  You saw her last in clinic on 02/15/2022.  Do you feel she is at acceptable risk to proceed with her upcoming surgical procedure?  Please send your reply to P CV DIV PREOP.  Thank you for your time and looking forward to your feedback.  Ambrose Pancoast, NP

## 2022-03-28 NOTE — Telephone Encounter (Signed)
..     Pre-operative Risk Assessment    Patient Name: Lauren [name]  DOB: Nov 11, 1955 MRN: [id number]      Request for Surgical Clearance    Procedure:   LEFT MASTECTOMY WITH SENTINEL NODE BIOPSY SURGERY  Date of Surgery:  Clearance TBD                                 Surgeon:  DR Coralie Keens Surgeon's Group or Practice Name:  [facility] Phone number:  [phone] Fax number:  [phone]   Type of Clearance Requested:   - Medical  - Pharmacy:  Hold Aspirin     Type of Anesthesia:  General    Additional requests/questions:    Gwenlyn Found   03/28/2022, 12:28 PM

## 2022-03-31 ENCOUNTER — Telehealth: Payer: Self-pay | Admitting: Hematology and Oncology

## 2022-03-31 ENCOUNTER — Encounter: Payer: Self-pay | Admitting: Genetic Counselor

## 2022-03-31 ENCOUNTER — Encounter: Payer: Self-pay | Admitting: *Deleted

## 2022-03-31 ENCOUNTER — Telehealth: Payer: Self-pay | Admitting: *Deleted

## 2022-03-31 ENCOUNTER — Telehealth: Payer: Self-pay | Admitting: Cardiology

## 2022-03-31 ENCOUNTER — Telehealth: Payer: Self-pay | Admitting: Genetic Counselor

## 2022-03-31 DIAGNOSIS — Z1379 Encounter for other screening for genetic and chromosomal anomalies: Secondary | ICD-10-CM | POA: Insufficient documentation

## 2022-03-31 NOTE — Telephone Encounter (Signed)
Per 2/15 Per IB reached out to schedule patient , patient scheduled and aware of time and date of appointment.

## 2022-03-31 NOTE — Telephone Encounter (Signed)
I contacted [name] to discuss her genetic testing results. No pathogenic variants were identified in the 70 genes analyzed. Detailed clinic note to follow.  The test report has been scanned into EPIC and is located under the Molecular Pathology section of the Results Review tab.  A portion of the result report is included below for reference.   Lucille Passy, MS, [facility] Genetic Counselor [email or web address] (P) [phone]

## 2022-03-31 NOTE — Telephone Encounter (Signed)
This message was sent to late. I have already s/w the pt and set up tele pre op appt

## 2022-03-31 NOTE — Telephone Encounter (Signed)
I s/w the pt and she has been scheduled for tele pre op appt 04/05/22 @ 9:40. Pt tells me that he procedure date is 04/14/22, though surgein office stated to our office TBD. I will change the appt visit to reflect clearance 04/14/22. Med rec and consent are done.     Patient Consent for Virtual Visit        Lauren [name] has provided verbal consent on 03/31/2022 for a virtual visit (video or telephone).   CONSENT FOR VIRTUAL VISIT FOR:  Lauren [name]  By participating in this virtual visit I agree to the following:  I hereby voluntarily request, consent and authorize [facility] and its employed or contracted physicians, physician assistants, nurse practitioners or other licensed health care professionals (the Practitioner), to provide me with telemedicine health care services (the "Services") as deemed necessary by the treating Practitioner. I acknowledge and consent to receive the Services by the Practitioner via telemedicine. I understand that the telemedicine visit will involve communicating with the Practitioner through live audiovisual communication technology and the disclosure of certain medical information by electronic transmission. I acknowledge that I have been given the opportunity to request an in-person assessment or other available alternative prior to the telemedicine visit and am voluntarily participating in the telemedicine visit.  I understand that I have the right to withhold or withdraw my consent to the use of telemedicine in the course of my care at any time, without affecting my right to future care or treatment, and that the Practitioner or I may terminate the telemedicine visit at any time. I understand that I have the right to inspect all information obtained and/or recorded in the course of the telemedicine visit and may receive copies of available information for a reasonable fee.  I understand that some of the potential risks of receiving the Services via  telemedicine include:  Delay or interruption in medical evaluation due to technological equipment failure or disruption; Information transmitted may not be sufficient (e.g. poor resolution of images) to allow for appropriate medical decision making by the Practitioner; and/or  In rare instances, security protocols could fail, causing a breach of personal health information.  Furthermore, I acknowledge that it is my responsibility to provide information about my medical history, conditions and care that is complete and accurate to the best of my ability. I acknowledge that Practitioner's advice, recommendations, and/or decision may be based on factors not within their control, such as incomplete or inaccurate data provided by me or distortions of diagnostic images or specimens that may result from electronic transmissions. I understand that the practice of medicine is not an exact science and that Practitioner makes no warranties or guarantees regarding treatment outcomes. I acknowledge that a copy of this consent can be made available to me via my patient portal ([place]), or I can request a printed copy by calling the office of [facility].    I understand that my insurance will be billed for this visit.   I have read or had this consent read to me. I understand the contents of this consent, which adequately explains the benefits and risks of the Services being provided via telemedicine.  I have been provided ample opportunity to ask questions regarding this consent and the Services and have had my questions answered to my satisfaction. I give my informed consent for the services to be provided through the use of telemedicine in my medical care

## 2022-03-31 NOTE — Telephone Encounter (Signed)
Left vm regarding [facility] from 2.7.24. Contact information provided for questions or needs.

## 2022-03-31 NOTE — Telephone Encounter (Signed)
Left message for the pt to call back to schedule a tele pre op appt 

## 2022-03-31 NOTE — Telephone Encounter (Signed)
Patient is returning CMA's call to setup virtual preop appt. Please advise.

## 2022-03-31 NOTE — Telephone Encounter (Signed)
   Name: Lauren [name]  DOB: Jun 09, 1955  MRN: [id number]  Primary Cardiologist: Freada Bergeron, MD   Preoperative team, please contact this patient and set up a phone call appointment for further preoperative risk assessment. Please obtain consent and complete medication review. Thank you for your help.  I confirm that guidance regarding antiplatelet and oral anticoagulation therapy has been completed and, if necessary, noted below.  From a cardiology perspective, she may hold aspirin for 5-7 days prior to procedure.    Mayra Reel, NP 03/31/2022, 8:28 AM [place]

## 2022-03-31 NOTE — Telephone Encounter (Signed)
I s/w the pt and she has been scheduled for tele pre op appt 04/05/22 @ 9:40. Pt tells me that he procedure date is 04/14/22, though surgein office stated to our office TBD. I will change the appt visit to reflect clearance 04/14/22. Med rec and consent are done.

## 2022-04-04 NOTE — Progress Notes (Unsigned)
Virtual Visit via Telephone Note   Because of Lauren [name]'s co-morbid illnesses, she is at least at moderate risk for complications without adequate follow up.  This format is felt to be most appropriate for this patient at this time.  The patient did not have access to video technology/had technical difficulties with video requiring transitioning to audio format only (telephone).  All issues noted in this document were discussed and addressed.  No physical exam could be performed with this format.  Please refer to the patient's chart for her consent to telehealth for [facility].  Evaluation Performed:  Preoperative cardiovascular risk assessment _____________   Date:  04/04/2022   Patient ID:  Lauren, [name] 03/09/1955, MRN [id number] Patient Location:  Home Provider location:   Office  Primary Care Provider:  Maurice Small, MD Primary Cardiologist:  Freada Bergeron, MD  Chief Complaint / Patient Profile   67 y.o. y/o female with a h/o palpitations with cardiac monitor 11/2021 which revealed 11 episodes of SVT with longest episode lasting 16.5 seconds, DOE, thyroid cancer, HTN, HLD, ITP who is pending left mastectomy with sentinel node biopsy and presents today for telephonic preoperative cardiovascular risk assessment.  History of Present Illness    Lauren [name] is a 67 y.o. female who presents via audio/video conferencing for a telehealth visit today.  Pt was last seen in cardiology clinic on 02/15/22 by Nicholes Rough, PA.  At that time Lauren [name] was having DOE and fatigue. Was advised to also check in with PCP due to history of B12 deficiency and thyroid cancer.  The patient is now pending procedure as outlined above. Since her last visit, she *** denies chest pain, shortness of breath, lower extremity edema, fatigue, palpitations, melena, hematuria, hemoptysis, diaphoresis, weakness, presyncope, syncope, orthopnea, and PND.   Past Medical History     Past Medical History:  Diagnosis Date   Breast cancer ([place])    Hyperlipidemia    Hypertension    Idiopathic thrombocytopenic purpura (ITP) ([place]) 02/14/1985   not current problem, no hematologist   Past Surgical History:  Procedure Laterality Date   ABDOMINAL HYSTERECTOMY  11-18-1998   partial   BREAST BIOPSY Left 03/15/2022   [place] LT BREAST BX W LOC DEV 1ST LESION IMG BX SPEC US GUIDE 03/15/2022 [facility]   BREAST BIOPSY Left 03/15/2022   [place] LT BREAST BX W LOC DEV EA ADD LESION IMG BX [place] US GUIDE 03/15/2022 [facility]   CHOLECYSTECTOMY  08/02/2011   Procedure: LAPAROSCOPIC CHOLECYSTECTOMY WITH INTRAOPERATIVE CHOLANGIOGRAM;  Surgeon: Pedro Earls, MD;  Location[facility];  Service: General;  Laterality: N/A;   LAPAROSCOPIC GASTRIC BANDING  05/19/2008   LUMBAR [place] SURGERY  03/21/1996   THYROIDECTOMY N/A 09/08/2015   Procedure: TOTAL THYROIDECTOMY;  Surgeon: Armandina Gemma, MD;  Location: [facility];  Service: General;  Laterality: N/A;   TONSILLECTOMY      Allergies  No Known Allergies  Home Medications    Prior to Admission medications   Medication Sig Start Date End Date Taking? Authorizing Provider  ACCU-CHEK GUIDE test strip check sugar 3x daily In [place] three times daily for 90 days 11/19/21   [provider]  Accu-Chek Softclix Lancets lancets USE 1 TO CHECK GLUCOSE THREE TIMES DAILY 01/24/22   [provider]  aspirin 81 MG tablet Take 81 mg by mouth daily.     [provider]  benazepril-hydrochlorthiazide (LOTENSIN HCT) 20-25 MG tablet Take 1 tablet by mouth  daily.    [provider]  Blood Glucose Monitoring Suppl (ACCU-CHEK GUIDE) w/Device KIT check glucose daily and as needed for 365 days 11/19/21   [provider]  Cholecalciferol (VITAMIN D3) 25 MCG (1000 UT) CAPS  11/14/21   [provider]  Cyanocobalamin (B-12) 500 MCG SUBL Place 1 tablet under the tongue daily. 02/22/22   [provider]  docusate  sodium (COLACE) 100 MG capsule Take 100 mg by mouth daily.    [provider]  EUTHYROX 175 MCG tablet Take 175 mcg by mouth daily before breakfast. 12/06/21   [provider]  metFORMIN (GLUCOPHAGE) 500 MG tablet 1 Tablet(s) By Mouth Every Evening 10/21/21   [provider]  metoprolol succinate (TOPROL XL) 50 MG 24 hr tablet Take 1 tablet (50 mg total) by mouth daily. Take with or immediately following a meal. 01/26/22   Pemberton, Greer Ee, MD  nystatin cream (MYCOSTATIN) 1 application Externally Twice a day as needed for 30 days    [provider]  rosuvastatin (CRESTOR) 40 MG tablet Take 1 tablet (40 mg total) by mouth daily. 01/27/22   Freada Bergeron, MD  terbinafine (LAMISIL) 250 MG tablet  01/14/22   [provider]  triamcinolone (KENALOG) Q000111Q % cream 1 application Externally Once a day as neeed    [provider]    Physical Exam    Vital Signs:  Lauren [name] does not have vital signs available for review today.***  Given telephonic nature of communication, physical exam is limited. AAOx3. NAD. Normal affect.  Speech and respirations are unlabored.  Accessory Clinical Findings    None  Assessment & Plan    1.  Preoperative Cardiovascular Risk Assessment:  The patient was advised that if she develops new symptoms prior to surgery to contact our office to arrange for a follow-up visit, and she verbalized understanding.  Per office protocol, he may hold aspirin for 5-7 days prior to procedure and should resume as soon as hemodynamically stable postoperatively.   A copy of this note will be routed to requesting surgeon.  Time:   Today, I have spent *** minutes with the patient with telehealth technology discussing medical history, symptoms, and management plan.     Emmaline Life, NP  04/04/2022, 7:14 AM;e

## 2022-04-05 ENCOUNTER — Encounter: Payer: Self-pay | Admitting: Nurse Practitioner

## 2022-04-05 ENCOUNTER — Ambulatory Visit: Payer: BC Managed Care – PPO | Attending: Cardiovascular Disease | Admitting: Nurse Practitioner

## 2022-04-05 DIAGNOSIS — Z01810 Encounter for preprocedural cardiovascular examination: Secondary | ICD-10-CM

## 2022-04-06 DIAGNOSIS — C50912 Malignant neoplasm of unspecified site of left female breast: Secondary | ICD-10-CM | POA: Diagnosis not present

## 2022-04-07 NOTE — Progress Notes (Signed)
Reviewed cardiac clearance with Dr. Lanetta Inch at [facility]. Okay to proceed with surgery as scheduled.

## 2022-04-08 ENCOUNTER — Encounter (HOSPITAL_BASED_OUTPATIENT_CLINIC_OR_DEPARTMENT_OTHER): Payer: Self-pay | Admitting: Surgery

## 2022-04-11 ENCOUNTER — Encounter (HOSPITAL_BASED_OUTPATIENT_CLINIC_OR_DEPARTMENT_OTHER)
Admission: RE | Admit: 2022-04-11 | Discharge: 2022-04-11 | Disposition: A | Discharge status: Home / Self Care | Payer: BC Managed Care – PPO | Source: Ambulatory Visit | Attending: Surgery | Admitting: Surgery

## 2022-04-11 DIAGNOSIS — Z01812 Encounter for preprocedural laboratory examination: Secondary | ICD-10-CM | POA: Diagnosis not present

## 2022-04-11 DIAGNOSIS — C50912 Malignant neoplasm of unspecified site of left female breast: Secondary | ICD-10-CM | POA: Diagnosis not present

## 2022-04-11 LAB — BASIC METABOLIC PANEL
Anion gap: 11 (ref 5–15)
BUN: 9 mg/dL (ref 8–23)
CO2: 25 mmol/L (ref 22–32)
Calcium: 9 mg/dL (ref 8.9–10.3)
Chloride: 104 mmol/L (ref 98–111)
Creatinine, Ser: 0.7 mg/dL (ref 0.44–1.00)
GFR, Estimated: >60 mL/min (ref >60)
Glucose, Bld: 185 mg/dL — ABNORMAL HIGH (ref 70–99)
Potassium: 3.8 mmol/L (ref 3.5–5.1)
Sodium: 140 mmol/L (ref 135–145)

## 2022-04-11 MED ORDER — ENSURE PRE-SURGERY PO LIQD: 296.0000 mL | Freq: Once | ORAL | Status: DC

## 2022-04-11 NOTE — Progress Notes (Signed)

## 2022-04-13 NOTE — H&P (Signed)
REFERRING PHYSICIAN: Mel Almond* PROVIDER: Beverlee Nims, MD MRN: [id number] DOB: 12/14/1955  Subjective  Chief Complaint: Breast Cancer  History of Present Illness: Lauren [name] is a 67 y.o. female who is seen today as an office consultation for evaluation of Breast Cancer  This is a 68 year old female who was found on recent screening mammography to have calcifications and a distortion in the left breast. The calcifications measured 9.2 cm. She underwent biopsy of 2 separate areas in the left breast. 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 The invasive cancer was 95% ER positive, 90% PR positive, HER2 negative, and had a Ki-67 of 30%. She has had no previous problems regarding her breast. There is a family history of breast cancer in her maternal aunt in her 70s. She denies nipple discharge. She has no cardiopulmonary issues.  Review of Systems: A complete review of systems was obtained from the patient. I have reviewed this information and discussed as appropriate with the patient. See HPI as well for other ROS.  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  Past Surgical History: Procedure Laterality Date .lap band N/A 2010 lap chole w/ IOC N/A 08/02/2011 THYROIDECTOMY TOTAL N/A 09/08/2015   No Known Allergies  Current Outpatient Medications on File Prior to Visit Medication Sig Dispense Refill aspirin 81 MG EC tablet Take 81 mg by mouth once daily benazepril-hydrochlorthiazide (LOTENSIN HCT) 20-25 mg tablet Take 1 tablet by mouth once  daily EUTHYROX 175 mcg tablet Take 175 mcg by mouth every morning before breakfast (0630) metFORMIN (GLUCOPHAGE) 500 MG tablet Take 500 mg by mouth every evening metoprolol succinate (TOPROL-XL) 50 MG XL tablet Take 50 mg by mouth as directed Take 1 tablet (50 mg total) by mouth daily. Take with or immediately following a meal. rosuvastatin (CRESTOR) 40 MG tablet Take 40 mg by mouth once daily terbinafine HCL (LAMISIL) 250 mg tablet Take 250 mg by mouth once daily Take as directed  No current facility-administered medications on file prior to visit.  Family History Problem Relation Age of Onset Coronary Artery Disease (Blocked arteries around heart) Mother Diabetes Father Coronary Artery Disease (Blocked arteries around heart) Father   Social History  Tobacco Use Smoking Status Never Smokeless Tobacco Never   Social History  Socioeconomic History Marital status: Married Tobacco Use Smoking status: Never Smokeless tobacco: Never Vaping Use Vaping Use: Never used Substance and Sexual Activity Alcohol use: Never Drug use: Never  Objective: There were no vitals filed for this visit. There is no height or weight on file to calculate BMI.  Physical Exam  She appears well on exam.  There is bruising and ecchymosis of the left breast from the biopsy but there are no palpable masses. The nipple areolar complex is normal.  There is 1 slightly enlarged, mobile reactive lymph node I suspect from the biopsy in the left axilla.  Labs, Imaging and Diagnostic Testing: I have reviewed her mammograms, ultrasound, and pathology results  Assessment and Plan:  Diagnoses and all orders for this visit:  Invasive ductal carcinoma of breast, left (CMS-HCC)  Neoplasm of left breast, primary tumor staging category Tis: ductal carcinoma in situ (DCIS)   We have discussed her in  our multidisciplinary breast cancer conference. She has both invasive and in situ left ductal carcinoma. I  had a long discussion with her and her husband regarding the findings. From a surgical standpoint we then discussed breast conservation versus mastectomy. If we were to consider breast conservation, she would at least need 1 further biopsy and likely MRI to make sure we are not missing anything else in the breast. I am worried this would leave a significant cosmetic defect as well as could miss further cancer. We also discussed mastectomy with and without reconstruction. After long discussion, she wants to forego an MRI and any further biopsies and just wants to proceed with a breast mastectomy and sentinel node biopsy. She does not want immediate reconstruction. We discussed the surgical procedure in detail. We discussed the risks which includes but is not limited to bleeding, infection, the need for drain postoperatively, seroma formation, injury to surrounding structures, the need for further procedures, cardiopulmonary issues, postoperative recovery, etc. Again, she agrees with the surgical plan so surgery will be scheduled.

## 2022-04-14 ENCOUNTER — Encounter (HOSPITAL_BASED_OUTPATIENT_CLINIC_OR_DEPARTMENT_OTHER)
Admission: RE | Disposition: A | Discharge status: Home / Self Care | Payer: Self-pay | Source: Home / Self Care | Attending: Surgery

## 2022-04-14 ENCOUNTER — Ambulatory Visit (HOSPITAL_BASED_OUTPATIENT_CLINIC_OR_DEPARTMENT_OTHER): Payer: BC Managed Care – PPO | Admitting: Anesthesiology

## 2022-04-14 ENCOUNTER — Ambulatory Visit (HOSPITAL_BASED_OUTPATIENT_CLINIC_OR_DEPARTMENT_OTHER)
Admission: RE | Admit: 2022-04-14 | Discharge: 2022-04-15 | Disposition: A | Discharge status: Home / Self Care | Payer: BC Managed Care – PPO | Attending: Surgery | Admitting: Surgery

## 2022-04-14 ENCOUNTER — Encounter (HOSPITAL_BASED_OUTPATIENT_CLINIC_OR_DEPARTMENT_OTHER): Payer: Self-pay | Admitting: Surgery

## 2022-04-14 ENCOUNTER — Other Ambulatory Visit: Payer: Self-pay

## 2022-04-14 DIAGNOSIS — Z170 Estrogen receptor positive status [ER+]: Secondary | ICD-10-CM | POA: Insufficient documentation

## 2022-04-14 DIAGNOSIS — Z803 Family history of malignant neoplasm of breast: Secondary | ICD-10-CM | POA: Diagnosis not present

## 2022-04-14 DIAGNOSIS — C773 Secondary and unspecified malignant neoplasm of axilla and upper limb lymph nodes: Secondary | ICD-10-CM | POA: Insufficient documentation

## 2022-04-14 DIAGNOSIS — C50912 Malignant neoplasm of unspecified site of left female breast: Secondary | ICD-10-CM | POA: Diagnosis not present

## 2022-04-14 DIAGNOSIS — Z01818 Encounter for other preprocedural examination: Secondary | ICD-10-CM

## 2022-04-14 DIAGNOSIS — C50412 Malignant neoplasm of upper-outer quadrant of left female breast: Secondary | ICD-10-CM | POA: Insufficient documentation

## 2022-04-14 DIAGNOSIS — G8918 Other acute postprocedural pain: Secondary | ICD-10-CM | POA: Diagnosis not present

## 2022-04-14 HISTORY — PX: MASTECTOMY W/ SENTINEL NODE BIOPSY: SHX2001

## 2022-04-14 HISTORY — DX: Type 2 diabetes mellitus without complications: E11.9

## 2022-04-14 LAB — GLUCOSE, CAPILLARY
Glucose-Capillary: 161 mg/dL — ABNORMAL HIGH (ref 70–99)
Glucose-Capillary: 161 mg/dL — ABNORMAL HIGH (ref 70–99)
Glucose-Capillary: 265 mg/dL — ABNORMAL HIGH (ref 70–99)

## 2022-04-14 SURGERY — MASTECTOMY WITH SENTINEL LYMPH NODE BIOPSY
Anesthesia: General | Site: Breast | Laterality: Left

## 2022-04-14 MED ORDER — ONDANSETRON HCL 4 MG/2ML IJ SOLN
INTRAMUSCULAR | Status: AC
  Filled 2022-04-14: qty 2

## 2022-04-14 MED ORDER — FENTANYL CITRATE (PF) 100 MCG/2ML IJ SOLN
INTRAMUSCULAR | Status: AC
  Filled 2022-04-14: qty 2

## 2022-04-14 MED ORDER — OXYCODONE HCL 5 MG PO TABS: 5.0000 mg | ORAL_TABLET | Freq: Once | ORAL | Status: DC | PRN

## 2022-04-14 MED ORDER — OXYCODONE HCL 5 MG PO TABS: 5.0000 mg | ORAL_TABLET | ORAL | Status: DC | PRN

## 2022-04-14 MED ORDER — BENAZEPRIL HCL 20 MG PO TABS
20.0000 mg | ORAL_TABLET | Freq: Every day | ORAL | Status: DC
  Filled 2022-04-14: qty 1

## 2022-04-14 MED ORDER — MIDAZOLAM HCL 2 MG/2ML IJ SOLN
1.0000 mg | Freq: Once | INTRAMUSCULAR | Status: AC
  Administered 2022-04-14: 1 mg via INTRAVENOUS

## 2022-04-14 MED ORDER — BUPIVACAINE LIPOSOME 1.3 % IJ SUSP
INTRAMUSCULAR | Status: DC | PRN
  Administered 2022-04-14: 10 mL

## 2022-04-14 MED ORDER — LIDOCAINE 2% (20 MG/ML) 5 ML SYRINGE
INTRAMUSCULAR | Status: AC
  Filled 2022-04-14: qty 5

## 2022-04-14 MED ORDER — ONDANSETRON HCL 4 MG/2ML IJ SOLN: 4.0000 mg | Freq: Once | INTRAMUSCULAR | Status: DC | PRN

## 2022-04-14 MED ORDER — HYDROMORPHONE HCL 1 MG/ML IJ SOLN
INTRAMUSCULAR | Status: AC
  Filled 2022-04-14: qty 0.5

## 2022-04-14 MED ORDER — DIPHENHYDRAMINE HCL 12.5 MG/5ML PO ELIX: 12.5000 mg | ORAL_SOLUTION | Freq: Four times a day (QID) | ORAL | Status: DC | PRN

## 2022-04-14 MED ORDER — ACETAMINOPHEN 500 MG PO TABS: 1000.0000 mg | ORAL_TABLET | ORAL | Status: AC

## 2022-04-14 MED ORDER — CHLORHEXIDINE GLUCONATE CLOTH 2 % EX PADS: 6.0000 | MEDICATED_PAD | Freq: Once | CUTANEOUS | Status: DC

## 2022-04-14 MED ORDER — LACTATED RINGERS IV SOLN: INTRAVENOUS | Status: DC

## 2022-04-14 MED ORDER — HYDROCHLOROTHIAZIDE 25 MG PO TABS
25.0000 mg | ORAL_TABLET | Freq: Every day | ORAL | Status: DC
  Filled 2022-04-14: qty 1

## 2022-04-14 MED ORDER — MAGTRACE LYMPHATIC TRACER
INTRAMUSCULAR | Status: DC | PRN
  Administered 2022-04-14: 2 mL via INTRAMUSCULAR

## 2022-04-14 MED ORDER — OXYCODONE HCL 5 MG/5ML PO SOLN: 5.0000 mg | Freq: Once | ORAL | Status: DC | PRN

## 2022-04-14 MED ORDER — 0.9 % SODIUM CHLORIDE (POUR BTL) OPTIME
TOPICAL | Status: DC | PRN
  Administered 2022-04-14: 800 mL

## 2022-04-14 MED ORDER — PROPOFOL 500 MG/50ML IV EMUL
INTRAVENOUS | Status: DC | PRN
  Administered 2022-04-14: 45 ug/kg/min via INTRAVENOUS

## 2022-04-14 MED ORDER — ONDANSETRON HCL 4 MG/2ML IJ SOLN
INTRAMUSCULAR | Status: DC | PRN
  Administered 2022-04-14: 4 mg via INTRAVENOUS

## 2022-04-14 MED ORDER — ONDANSETRON 4 MG PO TBDP: 4.0000 mg | ORAL_TABLET | Freq: Four times a day (QID) | ORAL | Status: DC | PRN

## 2022-04-14 MED ORDER — FENTANYL CITRATE (PF) 100 MCG/2ML IJ SOLN
50.0000 ug | Freq: Once | INTRAMUSCULAR | Status: AC
  Administered 2022-04-14: 50 ug via INTRAVENOUS

## 2022-04-14 MED ORDER — HYDROMORPHONE HCL 1 MG/ML IJ SOLN: 1.0000 mg | INTRAMUSCULAR | Status: DC | PRN

## 2022-04-14 MED ORDER — FENTANYL CITRATE (PF) 100 MCG/2ML IJ SOLN
INTRAMUSCULAR | Status: DC | PRN
  Administered 2022-04-14: 50 ug via INTRAVENOUS
  Administered 2022-04-14: 100 ug via INTRAVENOUS
  Administered 2022-04-14: 50 ug via INTRAVENOUS

## 2022-04-14 MED ORDER — DEXAMETHASONE SODIUM PHOSPHATE 10 MG/ML IJ SOLN
INTRAMUSCULAR | Status: DC | PRN
  Administered 2022-04-14: 5 mg via INTRAVENOUS

## 2022-04-14 MED ORDER — ONDANSETRON HCL 4 MG/2ML IJ SOLN: 4.0000 mg | Freq: Four times a day (QID) | INTRAMUSCULAR | Status: DC | PRN

## 2022-04-14 MED ORDER — ENOXAPARIN SODIUM 40 MG/0.4ML IJ SOSY: 40.0000 mg | PREFILLED_SYRINGE | INTRAMUSCULAR | Status: DC

## 2022-04-14 MED ORDER — PROPOFOL 10 MG/ML IV BOLUS
INTRAVENOUS | Status: AC
  Filled 2022-04-14: qty 20

## 2022-04-14 MED ORDER — BENAZEPRIL-HYDROCHLOROTHIAZIDE 20-25 MG PO TABS: 1.0000 | ORAL_TABLET | Freq: Every day | ORAL | Status: DC

## 2022-04-14 MED ORDER — FENTANYL CITRATE (PF) 100 MCG/2ML IJ SOLN
25.0000 ug | INTRAMUSCULAR | Status: DC | PRN
  Administered 2022-04-14 (×3): 50 ug via INTRAVENOUS

## 2022-04-14 MED ORDER — MIDAZOLAM HCL 2 MG/2ML IJ SOLN
INTRAMUSCULAR | Status: AC
  Filled 2022-04-14: qty 2

## 2022-04-14 MED ORDER — SODIUM CHLORIDE 0.9 % IV SOLN: INTRAVENOUS | Status: DC

## 2022-04-14 MED ORDER — TRAMADOL HCL 50 MG PO TABS: 50.0000 mg | ORAL_TABLET | Freq: Four times a day (QID) | ORAL | Status: DC | PRN

## 2022-04-14 MED ORDER — CEFAZOLIN SODIUM-DEXTROSE 2-3 GM-%(50ML) IV SOLR
INTRAVENOUS | Status: DC | PRN
  Administered 2022-04-14: 2 g via INTRAVENOUS

## 2022-04-14 MED ORDER — CEFAZOLIN SODIUM-DEXTROSE 2-4 GM/100ML-% IV SOLN
INTRAVENOUS | Status: AC
  Filled 2022-04-14: qty 100

## 2022-04-14 MED ORDER — METHOCARBAMOL 500 MG PO TABS
500.0000 mg | ORAL_TABLET | Freq: Four times a day (QID) | ORAL | Status: DC | PRN
  Administered 2022-04-14: 500 mg via ORAL
  Filled 2022-04-14: qty 1

## 2022-04-14 MED ORDER — BUPIVACAINE HCL (PF) 0.5 % IJ SOLN
INTRAMUSCULAR | Status: DC | PRN
  Administered 2022-04-14: 15 mL

## 2022-04-14 MED ORDER — HYDROMORPHONE HCL 1 MG/ML IJ SOLN
0.5000 mg | INTRAMUSCULAR | Status: DC | PRN
  Administered 2022-04-14: 0.5 mg via INTRAVENOUS

## 2022-04-14 MED ORDER — PROPOFOL 10 MG/ML IV BOLUS
INTRAVENOUS | Status: DC | PRN
  Administered 2022-04-14: 150 mg via INTRAVENOUS

## 2022-04-14 MED ORDER — LEVOTHYROXINE SODIUM 175 MCG PO TABS
175.0000 ug | ORAL_TABLET | Freq: Every day | ORAL | Status: DC
  Filled 2022-04-14: qty 1

## 2022-04-14 MED ORDER — CEFAZOLIN SODIUM-DEXTROSE 2-4 GM/100ML-% IV SOLN: 2.0000 g | INTRAVENOUS | Status: DC

## 2022-04-14 MED ORDER — ACETAMINOPHEN 500 MG PO TABS
1000.0000 mg | ORAL_TABLET | Freq: Once | ORAL | Status: AC
  Administered 2022-04-14: 1000 mg via ORAL

## 2022-04-14 MED ORDER — DIPHENHYDRAMINE HCL 50 MG/ML IJ SOLN: 12.5000 mg | Freq: Four times a day (QID) | INTRAMUSCULAR | Status: DC | PRN

## 2022-04-14 MED ORDER — INSULIN ASPART 100 UNIT/ML IJ SOLN: 0.0000 [IU] | Freq: Every day | INTRAMUSCULAR | Status: DC

## 2022-04-14 MED ORDER — LIDOCAINE HCL (CARDIAC) PF 100 MG/5ML IV SOSY
PREFILLED_SYRINGE | INTRAVENOUS | Status: DC | PRN
  Administered 2022-04-14: 60 mg via INTRAVENOUS

## 2022-04-14 MED ORDER — METOPROLOL SUCCINATE ER 50 MG PO TB24
50.0000 mg | ORAL_TABLET | Freq: Every day | ORAL | Status: DC
  Filled 2022-04-14: qty 1

## 2022-04-14 MED ORDER — LACTATED RINGERS IV SOLN: INTRAVENOUS | Status: DC | PRN

## 2022-04-14 MED ORDER — INSULIN ASPART 100 UNIT/ML IJ SOLN: 0.0000 [IU] | Freq: Three times a day (TID) | INTRAMUSCULAR | Status: DC

## 2022-04-14 MED ORDER — ACETAMINOPHEN 500 MG PO TABS
ORAL_TABLET | ORAL | Status: AC
  Filled 2022-04-14: qty 2

## 2022-04-14 SURGICAL SUPPLY — 45 items
ADH SKN CLS APL DERMABOND .7 (GAUZE/BANDAGES/DRESSINGS) ×1
APL PRP STRL LF DISP 70% ISPRP (MISCELLANEOUS) ×1
APPLIER CLIP 9.375 MED OPEN (MISCELLANEOUS) ×1
APR CLP MED 9.3 20 MLT OPN (MISCELLANEOUS) ×1
BLADE SURG 10 STRL SS (BLADE) IMPLANT
CANISTER SUCT 1200ML W/VALVE (MISCELLANEOUS) ×1 IMPLANT
CHLORAPREP W/TINT 26 (MISCELLANEOUS) ×1 IMPLANT
CLIP APPLIE 9.375 MED OPEN (MISCELLANEOUS) IMPLANT
COVER BACK TABLE 60X90IN (DRAPES) ×1 IMPLANT
COVER MAYO STAND STRL (DRAPES) ×1 IMPLANT
COVER PROBE CYLINDRICAL 5X96 (MISCELLANEOUS) ×1 IMPLANT
DERMABOND ADVANCED .7 DNX12 (GAUZE/BANDAGES/DRESSINGS) ×1 IMPLANT
DRAIN CHANNEL 19F RND (DRAIN) ×1 IMPLANT
DRAPE LAPAROSCOPIC ABDOMINAL (DRAPES) ×1 IMPLANT
DRAPE UTILITY XL STRL (DRAPES) ×1 IMPLANT
ELECT REM PT RETURN 9FT ADLT (ELECTROSURGICAL) ×1
ELECTRODE REM PT RTRN 9FT ADLT (ELECTROSURGICAL) ×1 IMPLANT
EVACUATOR SILICONE 100CC (DRAIN) ×1 IMPLANT
GAUZE SPONGE 4X4 12PLY STRL LF (GAUZE/BANDAGES/DRESSINGS) IMPLANT
GLOVE BIOGEL PI IND STRL 7.5 (GLOVE) IMPLANT
GLOVE SURG SIGNA 7.5 PF LTX (GLOVE) ×1 IMPLANT
GLOVE SURG SS PI 7.0 STRL IVOR (GLOVE) IMPLANT
GOWN STRL REUS W/ TWL LRG LVL3 (GOWN DISPOSABLE) ×1 IMPLANT
GOWN STRL REUS W/ TWL XL LVL3 (GOWN DISPOSABLE) ×1 IMPLANT
GOWN STRL REUS W/TWL LRG LVL3 (GOWN DISPOSABLE) ×1
GOWN STRL REUS W/TWL XL LVL3 (GOWN DISPOSABLE) ×1
HEMOSTAT SURGICEL 2X14 (HEMOSTASIS) IMPLANT
NDL SAFETY ECLIP 18X1.5 (MISCELLANEOUS) ×1 IMPLANT
NS IRRIG 1000ML POUR BTL (IV SOLUTION) ×1 IMPLANT
PACK BASIN DAY SURGERY FS (CUSTOM PROCEDURE TRAY) ×1 IMPLANT
PENCIL SMOKE EVACUATOR (MISCELLANEOUS) ×1 IMPLANT
PIN SAFETY STERILE (MISCELLANEOUS) ×1 IMPLANT
SLEEVE SCD COMPRESS KNEE MED (STOCKING) ×1 IMPLANT
SPIKE FLUID TRANSFER (MISCELLANEOUS) IMPLANT
SPONGE T-LAP 18X18 ~~LOC~~+RFID (SPONGE) ×1 IMPLANT
SUT ETHILON 2 0 FS 18 (SUTURE) IMPLANT
SUT ETHILON 3 0 PS 1 (SUTURE) ×1 IMPLANT
SUT MNCRL AB 4-0 PS2 18 (SUTURE) ×1 IMPLANT
SUT VICRYL 3-0 CR8 SH (SUTURE) IMPLANT
SYR BULB EAR ULCER 3OZ GRN STR (SYRINGE) ×1 IMPLANT
SYR CONTROL 10ML LL (SYRINGE) ×2 IMPLANT
TOWEL GREEN STERILE FF (TOWEL DISPOSABLE) ×1 IMPLANT
TRACER MAGTRACE VIAL (MISCELLANEOUS) IMPLANT
TUBE CONNECTING 20X1/4 (TUBING) ×1 IMPLANT
YANKAUER SUCT BULB TIP NO VENT (SUCTIONS) ×1 IMPLANT

## 2022-04-14 NOTE — Op Note (Signed)
   Lauren [name] 04/14/2022   Pre-op Diagnosis: LEFT BREAST CANCER     Post-op Diagnosis: same  Procedure(s): LEFT TOTAL MASTECTOMY WITH DEEP LEFT AXILLARY SENTINEL NODE BIOPSY INJECTION OF MAGTRACE FOR LYMPH NODE MAPPING   Surgeon(s): Coralie Keens, MD  Anesthesia: General  Staff:  Circulator: Izora Ribas, RN Scrub Person: BurroughsLisbeth Renshaw, RN  Estimated Blood Loss: Minimal               Specimens: sent to path  Indications: This is a 67 year old female who was found to have 9.2 cm area of calcifications in the left breast.  She had multiple biopsy performed with 1 showing invasive ductal carcinoma and the other showing ductal carcinoma in situ.  After discussion with her and her family she wished to proceed with a left mastectomy with axillary sentinel lymph node biopsy  Procedure: The patient was brought to the operating identified as a correct patient.  She is placed upon the operating table and general anesthesia was induced.  I next injected mag trace underneath the left nipple areolar complex and massaged the breast.  Her left breast and axilla were then prepped and draped in the usual sterile fashion.  I created an elliptical incision transversely across the chest wall incorporating the left breast nipple-areolar complex.  I then dissected down to the breast tissue circumferentially with electrocautery.  I then dissected the superior skin flap staying just underneath the skin and dermis going superiorly and then down to the chest wall.  I then dissected the inferior skin flap going down to the inframammary ridge.  I then took the dissection laterally toward the axilla.  I then slowly dissected the breast off of the chest wall and pectoralis fascia dissecting medial to lateral with electrocautery and tolerates the axilla.  I then completed the mastectomy removing the breast.  There were multiple large palpable lymph nodes in her axilla.  There was extensive uptake of  the MAC trace in the axilla.  I excised multiple enlarged lymph nodes with uptake in the mag trace and a large palpable lymph nodes because of their large size using the cautery and surgical clips.  These lymph nodes were deep in the axillary tissue.  Once the lymph nodes were removed achieved hemostasis with the cautery as well as clips and 3-0 Vicryl sutures.  Hemostasis peer to be achieved.  I irrigated the chest wall and axilla with saline.  I then made a separate skin incision and placed a 19 [place] Blake drain into the mastectomy site and axilla.  This was sewn in place with a nylon suture.  I then closed the subcutaneous tissue of the mastectomy site with interrupted 3-0 Vicryl sutures and closed the skin with a running 4-0 Monocryl.  Dermabond was then applied.  The drain was placed to bulb suction.  Gauze was then applied and a breast binder.  The patient tolerated procedure well.  All the counts were correct at the end of the procedure.  The patient was then extubated in the operating room and taken in a stable condition to the recovery room.           Coralie Keens   Date: 04/14/2022  Time: 2:06 PM

## 2022-04-14 NOTE — Interval H&P Note (Signed)
History and Physical Interval Note: no change in H and P  04/14/2022 11:32 AM  Lauren [name]  has presented today for surgery, with the diagnosis of LEFT BREAST CANCER.  The various methods of treatment have been discussed with the patient and family. After consideration of risks, benefits and other options for treatment, the patient has consented to  Procedure(s): LEFT MASTECTOMY WITH SENTINEL NODE BIOPSY (Left) as a surgical intervention.  The patient's history has been reviewed, patient examined, no change in status, stable for surgery.  I have reviewed the patient's chart and labs.  Questions were answered to the patient's satisfaction.     Coralie Keens

## 2022-04-14 NOTE — Anesthesia Preprocedure Evaluation (Addendum)
Anesthesia Evaluation  Patient identified by MRN, date of birth, ID band Patient awake    Reviewed: Allergy & Precautions, NPO status , Patient's Chart, lab work & pertinent test results, reviewed documented beta blocker date and time   History of Anesthesia Complications Negative for: history of anesthetic complications  Airway Mallampati: II  TM Distance: >3 FB Neck ROM: Full    Dental  (+) Dental Advisory Given, Teeth Intact   Pulmonary neg pulmonary ROS   Pulmonary exam normal        Cardiovascular hypertension, Pt. on medications and Pt. on home beta blockers Normal cardiovascular exam     Neuro/Psych negative neurological ROS  negative psych ROS   GI/Hepatic Neg liver ROS,,, S/p lap gastric banding    Endo/Other  diabetes, Type 2, Oral Hypoglycemic AgentsHypothyroidism   Obesity   Renal/GU negative Renal ROS     Musculoskeletal negative musculoskeletal ROS (+)    Abdominal   Peds  Hematology  ITP    Anesthesia Other Findings   Reproductive/Obstetrics                             Anesthesia Physical Anesthesia Plan  ASA: 2  Anesthesia Plan: General   Post-op Pain Management: Regional block* and Tylenol PO (pre-op)*   Induction: Intravenous  PONV Risk Score and Plan: 3 and Treatment may vary due to age or medical condition, Ondansetron and Dexamethasone  Airway Management Planned: LMA  Additional Equipment: None  Intra-op Plan:   Post-operative Plan: Extubation in OR  Informed Consent: I have reviewed the patients History and Physical, chart, labs and discussed the procedure including the risks, benefits and alternatives for the proposed anesthesia with the patient or authorized representative who has indicated his/her understanding and acceptance.     Dental advisory given  Plan Discussed with: CRNA and Anesthesiologist  Anesthesia Plan Comments:         Anesthesia Quick Evaluation

## 2022-04-14 NOTE — Anesthesia Procedure Notes (Signed)
Anesthesia Regional Block: Pectoralis block   Pre-Anesthetic Checklist: , timeout performed,  Correct Patient, Correct Site, Correct Laterality,  Correct Procedure, Correct Position, site marked,  Risks and benefits discussed,  Surgical consent,  Pre-op evaluation,  At surgeon's request and post-op pain management  Laterality: Left  Prep: chloraprep       Needles:  Injection technique: Single-shot  Needle Type: Echogenic Needle     Needle Length: 10cm  Needle Gauge: 21     Additional Needles:   Narrative:  Start time: 04/14/2022 12:23 PM End time: 04/14/2022 12:27 PM Injection made incrementally with aspirations every 5 mL.  Performed by: Personally  Anesthesiologist: Audry Pili, MD  Additional Notes: No pain on injection. No increased resistance to injection. Injection made in 5cc increments. Good needle visualization. Patient tolerated the procedure well.

## 2022-04-14 NOTE — Transfer of Care (Signed)
Immediate Anesthesia Transfer of Care Note  Patient: Lauren [name]  Procedure(s) Performed: LEFT MASTECTOMY WITH SENTINEL NODE BIOPSY (Left: Breast)  Patient Location: PACU  Anesthesia Type:General  Level of Consciousness: awake, alert , and oriented  Airway & Oxygen Therapy: Patient Spontanous Breathing and Patient connected to face mask oxygen  Post-op Assessment: Report given to RN and Post -op Vital signs reviewed and stable  Post vital signs: Reviewed and stable  Last Vitals:  Vitals Value Taken Time  BP 147/83 04/14/22 1411  Temp    Pulse 80 04/14/22 1413  Resp 18 04/14/22 1413  SpO2 98 % 04/14/22 1413  Vitals shown include unvalidated device data.  Last Pain:  Vitals:   04/14/22 1119  TempSrc: Oral  PainSc: 0-No pain      Patients Stated Pain Goal: 3 ([id number])  Complications: No notable events documented.

## 2022-04-14 NOTE — Anesthesia Procedure Notes (Signed)
Procedure Name: LMA Insertion Date/Time: 04/14/2022 2:15 PM  Performed by: Verita Lamb, CRNAPre-anesthesia Checklist: Patient identified, Emergency Drugs available, Suction available and Patient being monitored Patient Re-evaluated:Patient Re-evaluated prior to induction Oxygen Delivery Method: Circle system utilized Preoxygenation: Pre-oxygenation with 100% oxygen Induction Type: IV induction Ventilation: Mask ventilation without difficulty LMA: LMA inserted LMA Size: 4.0 Number of attempts: 1 Airway Equipment and Method: Bite block Placement Confirmation: positive ETCO2 Tube secured with: Tape Dental Injury: Teeth and Oropharynx as per pre-operative assessment

## 2022-04-14 NOTE — Progress Notes (Signed)
Assisted Dr. Fransisco Beau with left, pectoralis, ultrasound guided block. Side rails up, monitors on throughout procedure. See vital signs in flow sheet. Tolerated Procedure well.

## 2022-04-14 NOTE — Anesthesia Postprocedure Evaluation (Signed)
Anesthesia Post Note  Patient: Lauren [name]  Procedure(s) Performed: LEFT MASTECTOMY WITH SENTINEL NODE BIOPSY (Left: Breast)     Patient location during evaluation: PACU Anesthesia Type: General Level of consciousness: awake and alert Pain management: pain level controlled Vital Signs Assessment: post-procedure vital signs reviewed and stable Respiratory status: spontaneous breathing, nonlabored ventilation and respiratory function stable Cardiovascular status: stable and blood pressure returned to baseline Anesthetic complications: no   No notable events documented.  Last Vitals:  Vitals:   04/14/22 1449 04/14/22 1500  BP:  (!) 162/77  Pulse: 73 65  Resp: 14 10  Temp:    SpO2: 94% 97%    Last Pain:  Vitals:   04/14/22 1449  TempSrc:   PainSc: [place]

## 2022-04-15 LAB — GLUCOSE, CAPILLARY: Glucose-Capillary: 158 mg/dL — ABNORMAL HIGH (ref 70–99)

## 2022-04-15 MED ORDER — TRAMADOL HCL 50 MG PO TABS: 50.0000 mg | ORAL_TABLET | Freq: Four times a day (QID) | ORAL | 0 refills | Status: DC | PRN

## 2022-04-15 MED ORDER — METHOCARBAMOL 500 MG PO TABS: 500.0000 mg | ORAL_TABLET | Freq: Four times a day (QID) | ORAL | 0 refills | Status: DC | PRN

## 2022-04-15 NOTE — Discharge Instructions (Addendum)
About my Jackson-Pratt Bulb Drain  What is a Jackson-Pratt bulb? A Jackson-Pratt is a soft, round device used to collect drainage. It is connected to a long, thin drainage catheter, which is held in place by one or two small stiches near your surgical incision site. When the bulb is squeezed, it forms a vacuum, forcing the drainage to empty into the bulb.  Emptying the Jackson-Pratt bulb- To empty the bulb: 1. Release the plug on the top of the bulb. 2. Pour the bulb's contents into a measuring container which your nurse will provide. 3. Record the time emptied and amount of drainage. Empty the drain(s) as often as your     doctor or nurse recommends.  Date                  Time                    Amount (Drain 1)                 Amount (Drain 2)  _____________________________________________________________________  _____________________________________________________________________  _____________________________________________________________________  _____________________________________________________________________  _____________________________________________________________________  _____________________________________________________________________  _____________________________________________________________________  _____________________________________________________________________  Squeezing the Jackson-Pratt Bulb- To squeeze the bulb: 1. Make sure the plug at the top of the bulb is open. 2. Squeeze the bulb tightly in your fist. You will hear air squeezing from the bulb. 3. Replace the plug while the bulb is squeezed. 4. Use a safety pin to attach the bulb to your clothing. This will keep the catheter from     pulling at the bulb insertion site.  When to call your doctor- Call your doctor if: Drain site becomes red, swollen or hot. You have a fever greater than 101 degrees F. There is oozing at the drain site. Drain falls out (apply a guaze bandage  over the drain hole and secure it with tape). Drainage increases daily not related to activity patterns. (You will usually have more drainage when you are active than when you are resting.) Drainage has a bad odor.   Information for Discharge Teaching: EXPAREL (bupivacaine liposome injectable suspension)   Your surgeon or anesthesiologist gave you EXPAREL(bupivacaine) to help control your pain after surgery.  EXPAREL is a local anesthetic that provides pain relief by numbing the tissue around the surgical site. EXPAREL is designed to release pain medication over time and can control pain for up to 72 hours. Depending on how you respond to EXPAREL, you may require less pain medication during your recovery.  Possible side effects: Temporary loss of sensation or ability to move in the area where bupivacaine was injected. Nausea, vomiting, constipation Rarely, numbness and tingling in your mouth or lips, lightheadedness, or anxiety may occur. Call your doctor right away if you think you may be experiencing any of these sensations, or if you have other questions regarding possible side effects.  Follow all other discharge instructions given to you by your surgeon or nurse. Eat a healthy diet and drink plenty of water or other fluids.  If you return to the hospital for any reason within 96 hours following the administration of EXPAREL, it is important for health care providers to know that you have received this anesthetic. A teal colored band has been placed on your arm with the date, time and amount of EXPAREL you have received in order to alert and inform your health care providers. Please leave this armband in place for the full 96 hours following administration, and then you may remove  the band.       [facility]___[facility] [phone]  MASTECTOMY: POST OP INSTRUCTIONS  Always review your discharge instruction sheet given to you by the facility where your surgery was  performed. IF YOU HAVE DISABILITY OR FAMILY LEAVE FORMS, YOU MUST BRING THEM TO THE OFFICE FOR PROCESSING.   DO NOT GIVE THEM TO YOUR DOCTOR. A prescription for pain medication may be given to you upon discharge.  Take your pain medication as prescribed, if needed.  If narcotic pain medicine is not needed, then you may take acetaminophen (Tylenol) or ibuprofen (Advil) as needed. Take your usually prescribed medications unless otherwise directed. If you need a refill on your pain medication, please contact your pharmacy.  They will contact our office to request authorization.  Prescriptions will not be filled after 5pm or on week-ends. You should follow a light diet the first few days after arrival home, such as soup and crackers, etc.  Resume your normal diet the day after surgery. Most patients will experience some swelling and bruising on the chest and underarm.  Ice packs will help.  Swelling and bruising can take several days to resolve.  It is common to experience some constipation if taking pain medication after surgery.  Increasing fluid intake and taking a stool softener (such as Colace) will usually help or prevent this problem from occurring.  A mild laxative (Milk of Magnesia or Miralax) should be taken according to package instructions if there are no bowel movements after 48 hours. Unless discharge instructions indicate otherwise, leave your bandage dry and in place until your next appointment in 3-5 days.  You may take a limited sponge bath.  No tube baths or showers until the drains are removed.  You may have steri-strips (small skin tapes) in place directly over the incision.  These strips should be left on the skin for 7-10 days.  If your surgeon used skin glue on the incision, you may shower in 24 hours.  The glue will flake off over the next 2-3 weeks.  Any sutures or staples will be removed at the office during your follow-up visit. DRAINS:  If you have drains in place, it is important  to keep a list of the amount of drainage produced each day in your drains.  Before leaving the hospital, you should be instructed on drain care.  Call our office if you have any questions about your drains. ACTIVITIES:  You may resume regular (light) daily activities beginning the next day--such as daily self-care, walking, climbing stairs--gradually increasing activities as tolerated.  You may have sexual intercourse when it is comfortable.  Refrain from any heavy lifting or straining until approved by your doctor. You may drive when you are no longer taking prescription pain medication, you can comfortably wear a seatbelt, and you can safely maneuver your car and apply brakes. RETURN TO WORK:  __________________________________________________________ Dennis Bast should see your doctor in the office for a follow-up appointment approximately 3-5 days after your surgery.  Your doctor's nurse will typically make your follow-up appointment when she calls you with your pathology report.  Expect your pathology report 2-3 business days after your surgery.  You may call to check if you do not hear from [place] after three days.   OTHER INSTRUCTIONS: YOU MAY REMOVE THE BINDER AND SHOWER TODAY AND THEN REPLACE THE BINDER ______________________________________________________________________________________________ ____________________________________________________________________________________________ WHEN TO CALL YOUR DOCTOR: Fever over 101.0 Nausea and/or vomiting Extreme swelling or bruising Continued bleeding from incision. Increased pain, redness,  or drainage from the incision. The clinic staff is available to answer your questions during regular business hours.  Please don't hesitate to call and ask to speak to one of the nurses for clinical concerns.  If you have a medical emergency, go to the nearest emergency room or call 911.  A surgeon from [facility] is always on call at the hospital. [address], [place], [place], [place]  [address] ? [address], [place],    [address] [phone] ? [phone] ? FAX [phone]        JP Drain Totals Bring this sheet to all of your post-operative appointments while you have your drains. Please measure your drains by CC's or ML's. Make sure you drain and measure your JP Drains 3 times per day. At the end of each day, add up totals for the left side and add up totals for the right side.    ( 9 am )     ( 3 pm )        ( 9 pm )                Date L  R  L  R  L  R  Total L/R                                                                                                                                                                                           JP Drain Totals Bring this sheet to all of your post-operative appointments while you have your drains. Please measure your drains by CC's or ML's. Make sure you drain and measure your JP Drains 3 times per day. At the end of each day, add up totals for the left side and add up totals for the right side.    ( 9 am )     ( 3 pm )        ( 9 pm )                Date L  R  L  R  L  R  Total L/R

## 2022-04-15 NOTE — Discharge Summary (Signed)
Physician Discharge Summary  Patient ID: Lauren [name] MRN: [id number] DOB/AGE: Jun 25, 1955 67 y.o.  Admit date: 04/14/2022 Discharge date: 04/15/2022  Admission Diagnoses:  Discharge Diagnoses:  Active Problems:   * No active hospital problems. *   Discharged Condition: good  Hospital Course: uneventful post op recovery.discharged home POD#1   Consults: None  Significant Diagnostic Studies:   Treatments: surgery: left total mastectomy with deep left axillary sentinel node biopsy  Discharge Exam: Blood pressure 133/63, pulse 69, temperature 97.8 F (36.6 C), resp. rate 16, height '5\' 6"'$  (1.676 m), weight 98.7 kg, SpO2 96 %. General appearance: alert, cooperative, and no distress Resp: clear to auscultation bilaterally Cardio: regular rate and rhythm, S1, S2 normal, no murmur, click, rub or gallop Incision/Wound: flaps viable, drain serosang  Disposition: Discharge disposition: 01-Home or Self Care        Allergies as of 04/15/2022   No Known Allergies      Medication List     TAKE these medications    Accu-Chek Guide test strip Generic drug: glucose blood check sugar 3x daily In Vitro three times daily for 90 days   Accu-Chek Guide w/Device Kit check glucose daily and as needed for 365 days   Accu-Chek Softclix Lancets lancets USE 1 TO CHECK GLUCOSE THREE TIMES DAILY   aspirin 81 MG tablet Take 81 mg by mouth daily.   B-12 500 MCG Subl Place 1 tablet under the tongue daily.   benazepril-hydrochlorthiazide 20-25 MG tablet Commonly known as: LOTENSIN HCT Take 1 tablet by mouth daily.   docusate sodium 100 MG capsule Commonly known as: COLACE Take 100 mg by mouth daily.   Euthyrox 175 MCG tablet Generic drug: levothyroxine Take 175 mcg by mouth daily before breakfast.   metFORMIN 500 MG tablet Commonly known as: GLUCOPHAGE 1,000 mg. 1 Tablet(s) By Mouth Every Evening   methocarbamol 500 MG tablet Commonly known as: ROBAXIN Take 1 tablet  (500 mg total) by mouth every 6 (six) hours as needed for muscle spasms.   metoprolol succinate 50 MG 24 hr tablet Commonly known as: Toprol XL Take 1 tablet (50 mg total) by mouth daily. Take with or immediately following a meal.   nystatin cream Commonly known as: MYCOSTATIN 1 application Externally Twice a day as needed for 30 days   rosuvastatin 40 MG tablet Commonly known as: CRESTOR Take 1 tablet (40 mg total) by mouth daily.   terbinafine 250 MG tablet Commonly known as: LAMISIL   traMADol 50 MG tablet Commonly known as: ULTRAM Take 1 tablet (50 mg total) by mouth every 6 (six) hours as needed for moderate pain (mild pain).   triamcinolone 0.025 % cream Commonly known as: KENALOG 1 application Externally Once a day as neeed   Vitamin D3 25 MCG (1000 UT) capsule        Follow-up Information     Coralie Keens, MD. Schedule an appointment as soon as possible for a visit on 04/29/2022.   Specialty: General Surgery Why: call the office for the appointment time Contact information: [address] [place] [address] [phone]                 Signed: Coralie Keens 04/15/2022, 7:24 AM

## 2022-04-18 ENCOUNTER — Encounter (HOSPITAL_BASED_OUTPATIENT_CLINIC_OR_DEPARTMENT_OTHER): Payer: Self-pay | Admitting: Surgery

## 2022-04-19 LAB — SURGICAL PATHOLOGY

## 2022-04-20 ENCOUNTER — Encounter: Payer: Self-pay | Admitting: *Deleted

## 2022-04-20 ENCOUNTER — Telehealth: Payer: Self-pay | Admitting: *Deleted

## 2022-04-20 NOTE — Telephone Encounter (Signed)
Received order for oncotype testing. Requisition faxed to pathology 

## 2022-04-21 ENCOUNTER — Encounter: Payer: Self-pay | Admitting: Genetic Counselor

## 2022-04-21 ENCOUNTER — Ambulatory Visit: Payer: Self-pay | Admitting: Genetic Counselor

## 2022-04-21 DIAGNOSIS — Z1379 Encounter for other screening for genetic and chromosomal anomalies: Secondary | ICD-10-CM

## 2022-04-21 NOTE — Progress Notes (Signed)
HPI:   Lauren [name] was previously seen in the [facility] clinic due to a personal and family history of cancer and concerns regarding a hereditary predisposition to cancer. Please refer to our prior cancer genetics clinic note for more information regarding our discussion, assessment and recommendations, at the time. Ms. Parthemore recent genetic test results were disclosed to her, as were recommendations warranted by these results. These results and recommendations are discussed in more detail below.  CANCER HISTORY:  Oncology History  Malignant neoplasm of upper-outer quadrant of left breast in female, estrogen receptor positive ([place])  03/04/2022 Mammogram   Screening mammogram showed calcs and possible distortion in the left breast. Diag mammogram showed suspicious calcs and non mass finding in the 2 0 clock location of the left breast warranting further tissue diagnosis. No left axillary adenopathy   03/10/2022 Breast US   Breast ultrasound showed suspicious calcs and non-mass finding in the 2:00 location of the left breast with no evidence of left axillary adenopathy.  The area measured approximately 4.9 cm in largest dimension.   03/21/2022 Initial Diagnosis   Malignant neoplasm of upper-outer quadrant of left breast in female, estrogen receptor positive ([place])   03/23/2022 Cancer Staging   Staging form: Breast, AJCC 8th Edition - Clinical: Stage IB (cT2, cN0, cM0, G2, ER+, PR+, HER2-) - Signed by Hayden Pedro, PA-C on 03/23/2022 Stage prefix: Initial diagnosis Method of lymph node assessment: Clinical Histologic grading system: 3 grade system   03/25/2022 Pathology Results   Pathology showed grade 2 IDC, ER positive, PR positive, her 2 neg, Ki 67 30%    Genetic Testing   [facility] Multi-Cancer Panel+RNA was Negative. Report date is 03/31/2022.  The Multi-Cancer + RNA Panel offered by [facility] includes sequencing and/or deletion/duplication analysis of the following 70  genes:  AIP*, ALK, APC*, ATM*, AXIN2*, BAP1*, BARD1*, BLM*, BMPR1A*, BRCA1*, BRCA2*, BRIP1*, CDC73*, CDH1*, CDK4, CDKN1B*, CDKN2A, CHEK2*, CTNNA1*, DICER1*, EPCAM (del/dup only), EGFR, FH*, FLCN*, GREM1 (promoter dup only), HOXB13, KIT, LZTR1, MAX*, MBD4, MEN1*, MET, MITF, MLH1*, MSH2*, MSH3*, MSH6*, MUTYH*, NF1*, NF2*, NTHL1*, PALB2*, PDGFRA, PMS2*, POLD1*, POLE*, POT1*, PRKAR1A*, PTCH1*, PTEN*, RAD51C*, RAD51D*, RB1*, RET, SDHA* (sequencing only), SDHAF2*, SDHB*, SDHC*, SDHD*, SMAD4*, SMARCA4*, SMARCB1*, SMARCE1*, STK11*, SUFU*, TMEM127*, TP53*, TSC1*, TSC2*, VHL*. RNA analysis is performed for * genes.     FAMILY HISTORY:  We obtained a detailed, 4-generation family history.  Significant diagnoses are listed below:      Family History  Problem Relation Age of Onset   Colon polyps Mother     Heart disease Mother     Diabetes Father     Heart disease Father     Breast cancer Maternal Aunt 60 - 69   Ovarian cancer Paternal Aunt     Bladder Cancer Paternal Uncle          paternal half-uncle   Breast cancer Cousin 42 - 73       Ms. Hansson's maternal aunt was diagnosed with breast cancer in her 58s, she is deceased. Her paternal aunt was diagnosed with ovarian cancer, she is deceased. This paternal aunt's daughter was diagnosed with breast cancer in her 44s. Ms. Moody is unaware of previous family history of genetic testing for hereditary cancer risks. There is no reported Ashkenazi Jewish ancestry.  GENETIC TEST RESULTS:  The [facility] Multi-Cancer Panel found no pathogenic mutations.   The Multi-Cancer + RNA Panel offered by [facility] includes sequencing and/or deletion/duplication analysis of the following 70 genes:  AIP*,  ALK, APC*, ATM*, AXIN2*, BAP1*, BARD1*, BLM*, BMPR1A*, BRCA1*, BRCA2*, BRIP1*, CDC73*, CDH1*, CDK4, CDKN1B*, CDKN2A, CHEK2*, CTNNA1*, DICER1*, EPCAM (del/dup only), EGFR, FH*, FLCN*, GREM1 (promoter dup only), HOXB13, KIT, LZTR1, MAX*, MBD4, MEN1*, MET, MITF, MLH1*,  MSH2*, MSH3*, MSH6*, MUTYH*, NF1*, NF2*, NTHL1*, PALB2*, PDGFRA, PMS2*, POLD1*, POLE*, POT1*, PRKAR1A*, PTCH1*, PTEN*, RAD51C*, RAD51D*, RB1*, RET, SDHA* (sequencing only), SDHAF2*, SDHB*, SDHC*, SDHD*, SMAD4*, SMARCA4*, SMARCB1*, SMARCE1*, STK11*, SUFU*, TMEM127*, TP53*, TSC1*, TSC2*, VHL*. RNA analysis is performed for * genes.  The test report has been scanned into EPIC and is located under the Molecular Pathology section of the Results Review tab.  A portion of the result report is included below for reference. Genetic testing reported out on 03/31/2022.      Even though a pathogenic variant was not identified, possible explanations for the cancer in the family may include: There may be no hereditary risk for cancer in the family. The cancers in Ms. Kussman and/or her family may be due to other genetic or environmental factors. There may be a gene mutation in one of these genes that current testing methods cannot detect, but that chance is small. There could be another gene that has not yet been discovered, or that we have not yet tested, that is responsible for the cancer diagnoses in the family.  It is also possible there is a hereditary cause for the cancer in the family that Ms. Capote did not inherit.  Therefore, it is important to remain in touch with cancer genetics in the future so that we can continue to offer Ms. Foor the most up to date genetic testing.   ADDITIONAL GENETIC TESTING:  We discussed with Ms. Mounts that her genetic testing was fairly extensive.  If there are genes identified to increase cancer risk that can be analyzed in the future, we would be happy to discuss and coordinate this testing at that time.    CANCER SCREENING RECOMMENDATIONS:  Ms. Mansi test result is considered negative (normal).  This means that we have not identified a hereditary cause for her personal and family history of cancer at this time.   An individual's cancer risk and medical  management are not determined by genetic test results alone. Overall cancer risk assessment incorporates additional factors, including personal medical history, family history, and any available genetic information that may result in a personalized plan for cancer prevention and surveillance. Therefore, it is recommended she continue to follow the cancer management and screening guidelines provided by her oncology and primary healthcare provider.  RECOMMENDATIONS FOR FAMILY MEMBERS:   Since she did not inherit a mutation in a cancer predisposition gene included on this panel, her daughter could not have inherited a mutation from her in one of these genes. Individuals in this family might be at some increased risk of developing cancer, over the general population risk, due to the family history of cancer. We recommend women in this family have a yearly mammogram beginning at age 57, or 40 years younger than the earliest onset of cancer, an annual clinical breast exam, and perform monthly breast self-exams. Other members of the family may still carry a pathogenic variant in one of these genes that Ms. Entwistle did not inherit. Based on the family history, we recommend her paternal cousin who was diagnosed with breast cancer pursue genetic counseling and testing.   FOLLOW-UP:  Cancer genetics is a rapidly advancing field and it is possible that new genetic tests will be appropriate for her and/or her family  members in the future. We encouraged her to remain in contact with cancer genetics on an annual basis so we can update her personal and family histories and let her know of advances in cancer genetics that may benefit this family.   Our contact number was provided. Ms. Pool questions were answered to her satisfaction, and she knows she is welcome to call us at anytime with additional questions or concerns.   Lucille Passy, MS, [facility] Genetic Counselor [email or web address] (P)  [phone]

## 2022-05-06 ENCOUNTER — Inpatient Hospital Stay: Payer: BC Managed Care – PPO | Attending: Hematology and Oncology | Admitting: Hematology and Oncology

## 2022-05-06 VITALS — BP 148/67 | HR 64 | Temp 97.9°F | Resp 16 | Ht 66.0 in | Wt 218.6 lb

## 2022-05-06 DIAGNOSIS — Z170 Estrogen receptor positive status [ER+]: Secondary | ICD-10-CM | POA: Diagnosis not present

## 2022-05-06 DIAGNOSIS — C50412 Malignant neoplasm of upper-outer quadrant of left female breast: Secondary | ICD-10-CM | POA: Insufficient documentation

## 2022-05-06 NOTE — Assessment & Plan Note (Addendum)
This is a very pleasant 67 yr old female patient with PMH significant for HTN, obesity, hyperlipidemia referred to breast [place] for recommendations.  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  We sent for Oncotype testing but we are waiting for the results.  If Oncotype suggests benefit from addition of chemotherapy, we will consider chemotherapy.  If there is no benefit from chemo, she will proceed with radiation followed by adjuvant antiestrogen therapy and CDK 4 6 inhibition.  We have tentatively discussed about AC-T in case she will need adjuvant chemotherapy. We have discussed adverse effects of chemotherapy including but not limited to fatigue,nausea, vomiting, diarrhea, constipation, increased risk of infections, cardiotoxicity, neuropathy. She understands that some of the side effects can be permanent.   She then had a questionnaire or pathology work sheet and asked several good questions to fill this questionnaire. She understands since she is LN positive, she may also need adj radiation.  She should consider aromatase inhibitors along with abemaciclib for adjuvant anti estrogen therapy. She had questions if there is any connection to the breast and thyroid cancer. I reassured her that I am not aware that these are connected and it is very unlikely that the breast cancer will travel to thyroid.  I will call her once oncotype results are available.

## 2022-05-06 NOTE — Progress Notes (Signed)
[facility] NOTE  Patient Care Team: Maurice Small, MD as PCP - General (Family Medicine) Freada Bergeron, MD as PCP - Cardiology (Cardiology) Means, Johney Maine, RN as Registered Nurse (General Practice) Coralie Keens, MD as Consulting Physician (General Surgery) Benay Pike, MD as Consulting Physician (Hematology and Oncology) Kyung Rudd, MD as Consulting Physician ([place]) Mauro Kaufmann, RN as Oncology Nurse Navigator Rockwell Germany, RN as Oncology Nurse Navigator  CHIEF COMPLAINTS/PURPOSE OF CONSULTATION:  Newly diagnosed breast cancer  HISTORY OF PRESENTING ILLNESS:  Lauren [name] 67 y.o. female is here because of recent diagnosis of left breast cancer.  I reviewed her records extensively and collaborated the history with the patient.  SUMMARY OF ONCOLOGIC HISTORY: Oncology History  Malignant neoplasm of upper-outer quadrant of left breast in female, estrogen receptor positive ([place])  03/04/2022 Mammogram   Screening mammogram showed calcs and possible distortion in the left breast. Diag mammogram showed suspicious calcs and non mass finding in the 2 0 clock location of the left breast warranting further tissue diagnosis. No left axillary adenopathy   03/10/2022 Breast US   Breast ultrasound showed suspicious calcs and non-mass finding in the 2:00 location of the left breast with no evidence of left axillary adenopathy.  The area measured approximately 4.9 cm in largest dimension.   03/21/2022 Initial Diagnosis   Malignant neoplasm of upper-outer quadrant of left breast in female, estrogen receptor positive ([place])   03/23/2022 Cancer Staging   Staging form: Breast, AJCC 8th Edition - Pathologic: Stage IA (pT1b, pN1a, cM0, G2, ER+, PR+, HER2-) - Signed by Benay Pike, MD on 05/06/2022 Histologic grading system: 3 grade system   03/25/2022 Pathology Results   Pathology showed grade 2 IDC, ER positive, PR positive, her 2 neg, Ki 67  30%    Genetic Testing   [facility]ae Multi-Cancer Panel+RNA was Negative. Report date is 03/31/2022.  The Multi-Cancer + RNA Panel offered by [facility] includes sequencing and/or deletion/duplication analysis of the following 70 genes:  AIP*, ALK, APC*, ATM*, AXIN2*, BAP1*, BARD1*, BLM*, BMPR1A*, BRCA1*, BRCA2*, BRIP1*, CDC73*, CDH1*, CDK4, CDKN1B*, CDKN2A, CHEK2*, CTNNA1*, DICER1*, EPCAM (del/dup only), EGFR, FH*, FLCN*, GREM1 (promoter dup only), HOXB13, KIT, LZTR1, MAX*, MBD4, MEN1*, MET, MITF, MLH1*, MSH2*, MSH3*, MSH6*, MUTYH*, NF1*, NF2*, NTHL1*, PALB2*, PDGFRA, PMS2*, POLD1*, POLE*, POT1*, PRKAR1A*, PTCH1*, PTEN*, RAD51C*, RAD51D*, RB1*, RET, SDHA* (sequencing only), SDHAF2*, SDHB*, SDHC*, SDHD*, SMAD4*, SMARCA4*, SMARCB1*, SMARCE1*, STK11*, SUFU*, TMEM127*, TP53*, TSC1*, TSC2*, VHL*. RNA analysis is performed for * genes.    Lauren [name] is here with her husband and her daughter. She is now status post left mastectomy, extensive DCIS intermediate nuclear grade, invasive moderately differentiated adenocarcinoma grade 2, invasive tumor measures 8 mm in greatest dimension.  Margins free.  Left axillary lymph nodes regional resection which showed 3 of 17 lymph nodes with metastatic carcinoma showing marked fatty replacement.  Negative for extracapsular extension.  Prior prognostic showed ER +95% strong staining PR 90% moderate to strong staining HER2 negative 1+ Ki-67 of 30%. Rest of the pertinent 10 point ROS reviewed and negative.  MEDICAL HISTORY:  Past Medical History:  Diagnosis Date   Breast cancer ([place])    Diabetes mellitus without complication ([place])    Hyperlipidemia    Hypertension    Idiopathic thrombocytopenic purpura (ITP) ([place]) 02/14/1985   not current problem, no hematologist    SURGICAL HISTORY: Past Surgical History:  Procedure Laterality Date   ABDOMINAL HYSTERECTOMY  11-18-1998   partial   BREAST BIOPSY  Left 03/15/2022   [place] LT BREAST BX W LOC DEV 1ST LESION IMG BX SPEC US  GUIDE 03/15/2022 [facility]   BREAST BIOPSY Left 03/15/2022   [place] LT BREAST BX W LOC DEV EA ADD LESION IMG BX SPEC US GUIDE 03/15/2022 [facility]   CHOLECYSTECTOMY  08/02/2011   Procedure: LAPAROSCOPIC CHOLECYSTECTOMY WITH INTRAOPERATIVE CHOLANGIOGRAM;  Surgeon: Pedro Earls, MD;  Location: [facility];  Service: General;  Laterality: N/A;   LAPAROSCOPIC GASTRIC BANDING  05/19/2008   LUMBAR [place] SURGERY  03/21/1996   MASTECTOMY W/ SENTINEL NODE BIOPSY Left 04/14/2022   Procedure: LEFT MASTECTOMY WITH SENTINEL NODE BIOPSY;  Surgeon: Coralie Keens, MD;  Location: [facility];  Service: General;  Laterality: Left;   THYROIDECTOMY N/A 09/08/2015   Procedure: TOTAL THYROIDECTOMY;  Surgeon: Armandina Gemma, MD;  Location: [facility];  Service: General;  Laterality: N/A;   TONSILLECTOMY      SOCIAL HISTORY: Social History   Socioeconomic History   Marital status: Married    Spouse name: Not on file   Number of children: Not on file   Years of education: Not on file   Highest education level: Not on file  Occupational History   Not on file  Tobacco Use   Smoking status: Never   Smokeless tobacco: Never  Substance and Sexual Activity   Alcohol use: No   Drug use: No   Sexual activity: Yes  Other Topics Concern   Not on file  Social History Narrative   Not on file   Social Determinants of Health   Financial Resource Strain: Low Risk  (03/23/2022)   Overall Financial Resource Strain (CARDIA)    Difficulty of Paying Living Expenses: Not hard at all  Food Insecurity: No Food Insecurity (03/23/2022)   Hunger Vital Sign    Worried About Running Out of Food in the Last Year: Never true    Ran Out of Food in the Last Year: Never true  Transportation Needs: No Transportation Needs (03/23/2022)   PRAPARE - Hydrologist (Medical): No    Lack of Transportation (Non-Medical): No  Physical Activity: Not on file  Stress: Not on file  Social Connections:  Not on file  Intimate Partner Violence: Not on file    FAMILY HISTORY: Family History  Problem Relation Age of Onset   Colon polyps Mother    Heart disease Mother    Diabetes Father    Heart disease Father    Breast cancer Maternal Aunt 60 - 69   Ovarian cancer Paternal Aunt    Bladder Cancer Paternal Uncle        paternal half-uncle   Breast cancer Cousin 14 - 60    ALLERGIES:  has No Known Allergies.  MEDICATIONS:  Current Outpatient Medications  Medication Sig Dispense Refill   ACCU-CHEK GUIDE test strip check sugar 3x daily In Vitro three times daily for 90 days     Accu-Chek Softclix Lancets lancets USE 1 TO CHECK GLUCOSE THREE TIMES DAILY     aspirin 81 MG tablet Take 81 mg by mouth daily.      benazepril-hydrochlorthiazide (LOTENSIN HCT) 20-25 MG tablet Take 1 tablet by mouth daily.     Blood Glucose Monitoring Suppl (ACCU-CHEK GUIDE) w/Device KIT check glucose daily and as needed for 365 days     Cholecalciferol (VITAMIN D3) 25 MCG (1000 UT) CAPS      Cyanocobalamin (B-12) 500 MCG SUBL Place 1 tablet under the tongue daily.  docusate sodium (COLACE) 100 MG capsule Take 100 mg by mouth daily.     EUTHYROX 175 MCG tablet Take 175 mcg by mouth daily before breakfast.     metFORMIN (GLUCOPHAGE) 500 MG tablet 1,000 mg. 1 Tablet(s) By Mouth Every Evening     methocarbamol (ROBAXIN) 500 MG tablet Take 1 tablet (500 mg total) by mouth every 6 (six) hours as needed for muscle spasms. 20 tablet 0   metoprolol succinate (TOPROL XL) 50 MG 24 hr tablet Take 1 tablet (50 mg total) by mouth daily. Take with or immediately following a meal. 90 tablet 1   nystatin cream (MYCOSTATIN) 1 application Externally Twice a day as needed for 30 days     rosuvastatin (CRESTOR) 40 MG tablet Take 1 tablet (40 mg total) by mouth daily. 90 tablet 1   terbinafine (LAMISIL) 250 MG tablet      traMADol (ULTRAM) 50 MG tablet Take 1 tablet (50 mg total) by mouth every 6 (six) hours as needed for  moderate pain (mild pain). 25 tablet 0   triamcinolone (KENALOG) [id number] % cream 1 application Externally Once a day as neeed     No current facility-administered medications for this visit.   REVIEW OF SYSTEMS:   Constitutional: Denies fevers, chills or abnormal night sweats Eyes: Denies blurriness of vision, double vision or watery eyes Ears, nose, mouth, throat, and face: Denies mucositis or sore throat Respiratory: Denies cough, dyspnea or wheezes Cardiovascular: Denies palpitation, chest discomfort or lower extremity swelling Gastrointestinal:  Denies nausea, heartburn or change in bowel habits Skin: Denies abnormal skin rashes Lymphatics: Denies new lymphadenopathy or easy bruising Neurological:Denies numbness, tingling or new weaknesses Behavioral/Psych: Mood is stable, no new changes  Breast: Denies any palpable lumps or discharge All other systems were reviewed with the patient and are negative.  PHYSICAL EXAMINATION: ECOG PERFORMANCE STATUS: 0 - Asymptomatic  Vitals:   05/06/22 0834  BP: (!) 148/67  Pulse: 64  Resp: 16  Temp: 97.9 F (36.6 C)  SpO2: 100%   Filed Weights   05/06/22 0834  Weight: 218 lb 9.6 oz (99.2 kg)   PE deferred in lieu of counseling  LABORATORY DATA:  I have reviewed the data as listed Lab Results  Component Value Date   WBC 8.8 03/23/2022   HGB 13.6 03/23/2022   HCT 40.5 03/23/2022   MCV 89.0 03/23/2022   PLT 182 03/23/2022   Lab Results  Component Value Date   NA 140 04/11/2022   K 3.8 04/11/2022   CL 104 04/11/2022   CO2 25 04/11/2022    RADIOGRAPHIC STUDIES: I have personally reviewed the radiological reports and agreed with the findings in the report.  ASSESSMENT AND PLAN:  Malignant neoplasm of upper-outer quadrant of left breast in female, estrogen receptor positive ([place]) This is a very pleasant 67 yr old female patient with PMH significant for HTN, obesity, hyperlipidemia referred to breast [place] for recommendations.  She  is not status post mastectomy with grade 2 invasive ductal carcinoma measuring 8 mm, negative margins, 3 out of 17 lymph nodes with metastatic carcinoma, no evidence of extracapsular extension.  We sent for Oncotype testing but we are waiting for the results.  If Oncotype suggests benefit from addition of chemotherapy, we will consider chemotherapy.  If there is no benefit from chemo, she will proceed with radiation followed by adjuvant antiestrogen therapy and CDK 4 6 inhibition.  We have tentatively discussed about AC-T in case she will need adjuvant chemotherapy. We have  discussed adverse effects of chemotherapy including but not limited to fatigue,nausea, vomiting, diarrhea, constipation, increased risk of infections, cardiotoxicity, neuropathy. She understands that some of the side effects can be permanent.   She then had a questionnaire or pathology work sheet and asked several good questions to fill this questionnaire. She understands since she is LN positive, she may also need adj radiation.  She should consider aromatase inhibitors along with abemaciclib for adjuvant anti estrogen therapy. She had questions if there is any connection to the breast and thyroid cancer. I reassured her that I am not aware that these are connected and it is very unlikely that the breast cancer will travel to thyroid.  I will call her once oncotype results are available.        Total time spent: 40 minutes including history, physical exam, review of records, counseling and coordination of care, breast [place] discussion. All questions were answered. The patient knows to call the clinic with any problems, questions or concerns.    Benay Pike, MD 05/06/22

## 2022-05-09 ENCOUNTER — Telehealth: Payer: Self-pay | Admitting: Hematology and Oncology

## 2022-05-09 ENCOUNTER — Telehealth: Payer: Self-pay | Admitting: *Deleted

## 2022-05-09 DIAGNOSIS — Z170 Estrogen receptor positive status [ER+]: Secondary | ICD-10-CM | POA: Diagnosis not present

## 2022-05-09 DIAGNOSIS — C50412 Malignant neoplasm of upper-outer quadrant of left female breast: Secondary | ICD-10-CM | POA: Diagnosis not present

## 2022-05-09 NOTE — Telephone Encounter (Signed)
Called pt insurance company [place], [place] to provider further information for authorization on Oncotype testing. Provided information requested and informed that peer to peer needed to be completed by the provider. Dr. Chryl Heck received authorization # [id number] 3/25-6/22/24  Called [employer] and provided the above authorization number.

## 2022-05-09 NOTE — Telephone Encounter (Signed)
I called the patient and discussed the Oncotype DX results which is 10.  At this time there is no additional benefit from chemotherapy in this patient with node positive disease.  We will see her back after radiation.  I have also sent an in basket message to Dr. Genia Harold.

## 2022-05-10 NOTE — Progress Notes (Signed)
Cardiology Office Note:    Date:  05/17/2022   ID:  Lauren [name], DOB May 13, 1955, MRN [id number]  PCP:  [facility], [place] Providers Cardiologist:  Freada Bergeron, MD   Referring MD: No ref. provider found    History of Present Illness:    Lauren [name] is a 67 y.o. female with a hx of HTN, HLD, and ITP who presents to clinic for follow-up.  Patient initially seen in 08/2021 for palpitations. Cardiac monitor with NSR, 11 episodes of SVT with longest lasting 16.5s, rare ectopy. TTE 12/2021 showed LVEF 60-65%, G2DD, normal RV, no valve disease. Was placed on metop with improvement.  Was last seen in 03/2022 for pre-op evaluation for left mastectomy with node biopsy. Was doing well at that time from a CV standpoint.   Today, the patient is doing okay today. She is recovering from her mastectomy but 3/17 nodes were positive. She is now planned for XRT. She is otherwise doing well from a CV standpoint. No chest pain, SOB, lightheadedness, dizziness or syncope. No orthopnea, PND or LE edema. Palpitations has significantly improved.   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   not current problem, no hematologist    Past Surgical History:  Procedure Laterality Date   ABDOMINAL HYSTERECTOMY  11-18-1998   partial   BREAST BIOPSY Left 03/15/2022   [place] LT BREAST BX W LOC DEV 1ST LESION IMG BX [place] GUIDE 03/15/2022 [facility]   BREAST BIOPSY Left 03/15/2022   [place] LT BREAST BX W LOC DEV EA ADD LESION IMG BX [place] US GUIDE 03/15/2022 [facility]   CHOLECYSTECTOMY  08/02/2011   Procedure: LAPAROSCOPIC CHOLECYSTECTOMY WITH INTRAOPERATIVE CHOLANGIOGRAM;  Surgeon: Pedro Earls, MD;  Location: [facility];  Service: General;  Laterality: N/A;   LAPAROSCOPIC  GASTRIC BANDING  05/19/2008   LUMBAR [place] SURGERY  03/21/1996   MASTECTOMY W/ SENTINEL NODE BIOPSY Left 04/14/2022   Procedure: LEFT MASTECTOMY WITH SENTINEL NODE BIOPSY;  Surgeon: Coralie Keens, MD;  Location: [facility];  Service: General;  Laterality: Left;   THYROIDECTOMY N/A 09/08/2015   Procedure: TOTAL THYROIDECTOMY;  Surgeon: Armandina Gemma, MD;  Location: [facility];  Service: General;  Laterality: N/A;   TONSILLECTOMY      Current Medications: Current Meds  Medication Sig   ACCU-CHEK GUIDE test strip check sugar 3x daily In Vitro three times daily for 90 days   Accu-Chek Softclix Lancets lancets USE 1 TO CHECK GLUCOSE THREE TIMES DAILY   aspirin 81 MG tablet Take 81 mg by mouth daily.    benazepril-hydrochlorthiazide (LOTENSIN HCT) 20-25 MG tablet Take 1 tablet by mouth daily.   Blood Glucose Monitoring Suppl (ACCU-CHEK GUIDE) w/Device KIT check glucose daily and as needed for 365 days   Cyanocobalamin (B-12) 500 MCG SUBL Place 1 tablet under the tongue daily.   docusate sodium (COLACE) 100 MG capsule Take 100 mg by mouth daily.   EUTHYROX 175 MCG tablet Take 175 mcg by mouth daily before breakfast.   metFORMIN (GLUCOPHAGE) 500 MG tablet 500 mg. 1 tablet with breakfast, 1 tablet with supper   metoprolol succinate (TOPROL XL) 50 MG 24 hr tablet Take 1 tablet (50 mg total) by mouth daily. Take with or immediately following a meal.  nystatin cream (MYCOSTATIN) 1 application Externally Twice a day as needed for 30 days   rosuvastatin (CRESTOR) 40 MG tablet Take 1 tablet (40 mg total) by mouth daily.   terbinafine (LAMISIL) 250 MG tablet    triamcinolone (KENALOG) Q000111Q % cream 1 application Externally Once a day as neeed   VITAMIN D3 1.25 MG (50000 UT) capsule Take 50,000 Units by mouth once a week.   [DISCONTINUED] Cholecalciferol (VITAMIN D3) 25 MCG (1000 UT) CAPS      Allergies:   Patient has no known allergies.   Social History   Socioeconomic History   Marital  status: Married    Spouse name: Not on file   Number of children: Not on file   Years of education: Not on file   Highest education level: Not on file  Occupational History   Not on file  Tobacco Use   Smoking status: Never   Smokeless tobacco: Never  Substance and Sexual Activity   Alcohol use: No   Drug use: No   Sexual activity: Yes  Other Topics Concern   Not on file  Social History Narrative   Not on file   Social Determinants of Health   Financial Resource Strain: Low Risk  (03/23/2022)   Overall Financial Resource Strain (CARDIA)    Difficulty of Paying Living Expenses: Not hard at all  Food Insecurity: No Food Insecurity (03/23/2022)   Hunger Vital Sign    Worried About Running Out of Food in the Last Year: Never true    [place] in the Last Year: Never true  Transportation Needs: No Transportation Needs (03/23/2022)   PRAPARE - Hydrologist (Medical): No    Lack of Transportation (Non-Medical): No  Physical Activity: Not on file  Stress: Not on file  Social Connections: Not on file     Family History: The patient's family history includes Bladder Cancer in her paternal uncle; Breast cancer (age of onset: 79 - 63) in her cousin; Breast cancer (age of onset: 43 - 77) in her maternal aunt; Colon polyps in her mother; Diabetes in her father; Heart disease in her father and mother; Ovarian cancer in her paternal aunt.  ROS:   Review of Systems  Constitutional:  Negative for chills and fever.  Eyes:  Negative for blurred vision and pain.  Respiratory:  Negative for cough, hemoptysis and shortness of breath.   Cardiovascular:  Positive for palpitations. Negative for chest pain, orthopnea, claudication, leg swelling and PND.  Gastrointestinal:  Negative for blood in stool, diarrhea, nausea and vomiting.  Genitourinary:  Negative for dysuria and hematuria.  Musculoskeletal:  Positive for joint pain (Ankles, knees, hips). Negative for falls.   Neurological:  Negative for dizziness, loss of consciousness and headaches.     EKGs/Labs/Other Studies Reviewed:    The following studies were reviewed today:  Cardiac monitor 01/2022:   Predominant rhythm was normal sinus rhythm with average heart rate 79bpm and ranged from 58 to 115bpm.   11 episodes of SVT s/w atrial tachycardia with longest episode lasting 16.5 sec and fastest rate 187bpm.   Rare PACs, atrial couplets and triplets   Rare PVCs, ventricular couplets and ventricular trigeminy.  PVC load < 1%     Patch Wear Time:  13 days and 21 hours (2023-11-25T10:57:07-498 to 2023-12-09T08:45:58-0500)   Patient had a min HR of 58 bpm, max HR of 187 bpm, and avg HR of 79 bpm. Predominant underlying rhythm was Sinus Rhythm.  11 Supraventricular Tachycardia runs occurred, the run with the fastest interval lasting 5 beats with a max rate of 187 bpm, the  longest lasting 16.5 secs with an avg rate of 118 bpm. Isolated SVEs were rare (<1.0%), SVE Couplets were rare (<1.0%), and SVE Triplets were rare (<1.0%). Isolated VEs were rare (<1.0%), VE Couplets were rare (<1.0%), and no VE Triplets were present.  Ventricular Trigeminy was present.   TTE 12/2021: IMPRESSIONS     1. Left ventricular ejection fraction, by estimation, is 60 to 65%. The  left ventricle has normal function. The left ventricle has no regional  wall motion abnormalities. Left ventricular diastolic parameters are  consistent with Grade II diastolic  dysfunction (pseudonormalization).   2. Right ventricular systolic function is normal. The right ventricular  size is normal. Tricuspid regurgitation signal is inadequate for assessing  PA pressure.   3. The mitral valve is grossly normal. No evidence of mitral valve  regurgitation.   4. The aortic valve was not well visualized. Aortic valve regurgitation  is not visualized.   5. The inferior vena cava is normal in size with greater than 50%  respiratory variability,  suggesting right atrial pressure of 3 mmHg.   Comparison(s): No prior Echocardiogram.   EKG:  No new tracing today   Recent Labs: 02/15/2022: Magnesium 1.8 03/23/2022: Hemoglobin 13.6; Platelet Count 182 03/25/2022: ALT 14 04/11/2022: BUN 9; Creatinine, Ser 0.70; Potassium 3.8; Sodium 140   Recent Lipid Panel    Component Value Date/Time   CHOL 101 03/25/2022 0801   TRIG 137 03/25/2022 0801   HDL 33 (L) 03/25/2022 0801   CHOLHDL 3.1 03/25/2022 0801   LDLCALC 44 03/25/2022 0801     Risk Assessment/Calculations:                 Physical Exam:    VS:  BP 139/82   Pulse 74   Ht 5\' 6"  (1.676 m)   Wt 224 lb 12.8 oz (102 kg)   SpO2 94%   BMI 36.28 kg/m     Wt Readings from Last 3 Encounters:  05/17/22 224 lb 12.8 oz (102 kg)  05/06/22 218 lb 9.6 oz (99.2 kg)  03/23/22 217 lb 9.6 oz (98.7 kg)     GEN: Well nourished, well developed in no acute distress HEENT: Normal NECK: No JVD; No carotid bruits CARDIAC: RRR, no murmurs, rubs, gallops RESPIRATORY:  Clear to auscultation without rales, wheezing or rhonchi  ABDOMEN: Soft, non-tender, non-distended MUSCULOSKELETAL:  No edema; No deformity  SKIN: Warm and dry NEUROLOGIC:  Alert and oriented x 3 PSYCHIATRIC:  Normal affect   ASSESSMENT:    1. Palpitations   2. Aortic atherosclerosis   3. Essential hypertension   4. Hyperlipidemia LDL goal <70   5. Diabetes mellitus with coincident hypertension   6. SOB (shortness of breath)   7. Malignant neoplasm of upper-outer quadrant of left breast in female, estrogen receptor positive     PLAN:    In order of problems listed above:  #Palpitations: Cardiac monitor with brief runs of SVT with longest lasting 16s and overall rare ectopy. Symptoms improved on metop. -Continue metop 50mg  XL daily  #DOE: TTE reassuring with LVEF 65-70%, G2DD, no valve disease. CT chest without significant coronary Ca. Overall, symptoms have improved.   #HTN: Blood pressure mainly  120-130s at home. Often elevated in MD office.  -Continue benzapril-HCTZ 20-25mg  daily -Continue metop 50mg  XL daily  #HLD: #Aortic Atherosclerosis: -Continue crestor 40mg  daily -Goal LDL<70; LDL  currently 66 -Continue lifestyle modifications as below  #Breast Cancer: -ER positive breast cancer now s/p mastectomy with 3/17 nodes positive -Now planned for XRT; no plans for chemo as of now per report  #Obesity: BMI 36. Working on lifestyle modifications. Declined GLP-1.  #DMII: -Management per PCP   Follow-up:  6 months.  Medication Adjustments/Labs and Tests Ordered: Current medicines are reviewed at length with the patient today.  Concerns regarding medicines are outlined above.   No orders of the defined types were placed in this encounter.  No orders of the defined types were placed in this encounter.  Patient Instructions  Medication Instructions:  No changes *If you need a refill on your cardiac medications before your next appointment, please call your pharmacy*   Lab Work: none If you have labs (blood work) drawn today and your tests are completely normal, you will receive your results only by: [facility] (if you have MyChart) OR A paper copy in the mail If you have any lab test that is abnormal or we need to change your treatment, we will call you to review the results.   Testing/Procedures: none   Follow-Up: At [facility], you and your health needs are our priority.  As part of our continuing mission to provide you with exceptional heart care, we have created designated Provider Care Teams.  These Care Teams include your primary Cardiologist (physician) and Advanced Practice Providers (APPs -  Physician Assistants and Nurse Practitioners) who all work together to provide you with the care you need, when you need it.   Your next appointment:   12 month(s)  Provider:   Freada Bergeron, MD           Signed, Freada Bergeron,  MD  05/17/2022 8:41 AM    [place]

## 2022-05-11 ENCOUNTER — Telehealth: Payer: Self-pay | Admitting: *Deleted

## 2022-05-11 ENCOUNTER — Encounter: Payer: Self-pay | Admitting: *Deleted

## 2022-05-11 ENCOUNTER — Encounter: Payer: Self-pay | Admitting: Hematology and Oncology

## 2022-05-11 ENCOUNTER — Telehealth: Payer: Self-pay | Admitting: Radiation Oncology

## 2022-05-11 NOTE — Telephone Encounter (Signed)
Scheduled appt with pt for CON30 and SIM as instructed via inbasket.

## 2022-05-11 NOTE — Telephone Encounter (Signed)
Received oncotype score of 10. Discussed next step is xrt. Received verbal understanding.

## 2022-05-13 ENCOUNTER — Encounter (HOSPITAL_COMMUNITY): Payer: Self-pay

## 2022-05-16 DIAGNOSIS — Z170 Estrogen receptor positive status [ER+]: Secondary | ICD-10-CM | POA: Diagnosis not present

## 2022-05-16 DIAGNOSIS — C50412 Malignant neoplasm of upper-outer quadrant of left female breast: Secondary | ICD-10-CM | POA: Diagnosis not present

## 2022-05-17 ENCOUNTER — Ambulatory Visit: Payer: BC Managed Care – PPO | Attending: Cardiology | Admitting: Cardiology

## 2022-05-17 ENCOUNTER — Encounter: Payer: Self-pay | Admitting: Cardiology

## 2022-05-17 VITALS — BP 139/82 | HR 74 | Ht 66.0 in | Wt 224.8 lb

## 2022-05-17 DIAGNOSIS — E785 Hyperlipidemia, unspecified: Secondary | ICD-10-CM | POA: Diagnosis not present

## 2022-05-17 DIAGNOSIS — R002 Palpitations: Secondary | ICD-10-CM

## 2022-05-17 DIAGNOSIS — I700 Atherosclerosis of aorta: Secondary | ICD-10-CM | POA: Diagnosis not present

## 2022-05-17 DIAGNOSIS — I10 Essential (primary) hypertension: Secondary | ICD-10-CM

## 2022-05-17 DIAGNOSIS — E119 Type 2 diabetes mellitus without complications: Secondary | ICD-10-CM

## 2022-05-17 DIAGNOSIS — R0602 Shortness of breath: Secondary | ICD-10-CM

## 2022-05-17 DIAGNOSIS — C50412 Malignant neoplasm of upper-outer quadrant of left female breast: Secondary | ICD-10-CM

## 2022-05-17 DIAGNOSIS — Z170 Estrogen receptor positive status [ER+]: Secondary | ICD-10-CM

## 2022-05-17 NOTE — Patient Instructions (Signed)
Medication Instructions:  No changes *If you need a refill on your cardiac medications before your next appointment, please call your pharmacy*   Lab Work: none If you have labs (blood work) drawn today and your tests are completely normal, you will receive your results only by: [facility] (if you have MyChart) OR A paper copy in the mail If you have any lab test that is abnormal or we need to change your treatment, we will call you to review the results.   Testing/Procedures: none   Follow-Up: At [facility], you and your health needs are our priority.  As part of our continuing mission to provide you with exceptional heart care, we have created designated Provider Care Teams.  These Care Teams include your primary Cardiologist (physician) and Advanced Practice Providers (APPs -  Physician Assistants and Nurse Practitioners) who all work together to provide you with the care you need, when you need it.   Your next appointment:   12 month(s)  Provider:   Freada Bergeron, MD

## 2022-05-18 NOTE — Progress Notes (Incomplete)
Nursing interview for Malignant neoplasm of upper-outer quadrant of left breast in female, estrogen receptor positive.  Patient identity verified as Lauren [name]. [place].  Patient reports occasional sharp/ shooting pains 3/10, in the LT breast area, but is doing well the mastectomy. No other issues conveyed at this time.  Meaningful use complete. Hysterectomy- NO chances of pregnancy.  Vitals- BP 128/63 (BP Location: Left Arm, Patient Position: Sitting, Cuff Size: Normal)   Pulse 67   Temp (!) 97 F (36.1 C) (Temporal)   Resp 18   Ht 5\' 6"  (1.676 m)   Wt 224 lb 2 oz (101.7 kg)   SpO2 98%   BMI 36.17 kg/m   This concludes the interview.   Leandra Kern, LPN

## 2022-05-19 ENCOUNTER — Encounter: Payer: Self-pay | Admitting: Radiation Oncology

## 2022-05-19 ENCOUNTER — Ambulatory Visit
Admission: RE | Admit: 2022-05-19 | Discharge: 2022-05-19 | Disposition: A | Discharge status: Home / Self Care | Payer: BC Managed Care – PPO | Source: Ambulatory Visit | Attending: Radiation Oncology | Admitting: Radiation Oncology

## 2022-05-19 VITALS — BP 128/63 | HR 67 | Temp 97.0°F | Resp 18 | Ht 66.0 in | Wt 224.1 lb

## 2022-05-19 VITALS — BP 128/63 | HR 67 | Temp 97.0°F | Resp 18 | Ht 66.0 in | Wt 224.0 lb

## 2022-05-19 DIAGNOSIS — E785 Hyperlipidemia, unspecified: Secondary | ICD-10-CM | POA: Insufficient documentation

## 2022-05-19 DIAGNOSIS — C50412 Malignant neoplasm of upper-outer quadrant of left female breast: Secondary | ICD-10-CM | POA: Insufficient documentation

## 2022-05-19 DIAGNOSIS — E119 Type 2 diabetes mellitus without complications: Secondary | ICD-10-CM | POA: Insufficient documentation

## 2022-05-19 DIAGNOSIS — Z7984 Long term (current) use of oral hypoglycemic drugs: Secondary | ICD-10-CM | POA: Insufficient documentation

## 2022-05-19 DIAGNOSIS — Z803 Family history of malignant neoplasm of breast: Secondary | ICD-10-CM | POA: Insufficient documentation

## 2022-05-19 DIAGNOSIS — Z7982 Long term (current) use of aspirin: Secondary | ICD-10-CM | POA: Diagnosis not present

## 2022-05-19 DIAGNOSIS — Z510 Encounter for antineoplastic radiation therapy: Secondary | ICD-10-CM | POA: Insufficient documentation

## 2022-05-19 DIAGNOSIS — Z85850 Personal history of malignant neoplasm of thyroid: Secondary | ICD-10-CM | POA: Diagnosis not present

## 2022-05-19 DIAGNOSIS — D693 Immune thrombocytopenic purpura: Secondary | ICD-10-CM | POA: Diagnosis not present

## 2022-05-19 DIAGNOSIS — Z79899 Other long term (current) drug therapy: Secondary | ICD-10-CM | POA: Insufficient documentation

## 2022-05-19 DIAGNOSIS — Z8052 Family history of malignant neoplasm of bladder: Secondary | ICD-10-CM | POA: Insufficient documentation

## 2022-05-19 DIAGNOSIS — Z170 Estrogen receptor positive status [ER+]: Secondary | ICD-10-CM

## 2022-05-19 DIAGNOSIS — I10 Essential (primary) hypertension: Secondary | ICD-10-CM | POA: Diagnosis not present

## 2022-05-19 DIAGNOSIS — Z8041 Family history of malignant neoplasm of ovary: Secondary | ICD-10-CM | POA: Diagnosis not present

## 2022-05-19 NOTE — Progress Notes (Signed)
Radiation Oncology         [phone] ________________________________  Name: Lauren [name]        MRN: [id number]  Date of Service: 05/19/2022 DOB: 1955-03-29  [id number], Lauren Knapp, FNP  Benay Pike, MD     REFERRING PHYSICIAN: Benay Pike, MD   DIAGNOSIS: The encounter diagnosis was Malignant neoplasm of upper-outer quadrant of left breast in female, estrogen receptor positive.   HISTORY OF PRESENT ILLNESS: Lauren [name] is a 67 y.o. female originally seen in the multidisciplinary breast clinic for a new diagnosis of left breast cancer. The patient was noted to have a screening detected group of calcifications and distortion in the left breast. The calcifications measured up to 9.2 cm by mammogram, and 4.9 cm of non mass fainding in the 2:00 area by ultrasound. While she had multiple visualized axillary nodes, none of them were abnormal in appearance. 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 There was also intermediate grade DCIS with necrosis that was ER/PR positive.   Since her last visit she has undergone left mastectomy with sentinel node biopsy on 04/14/22 that showed a grade 2 invasive ductal carcinoma measuring 8 mm in greatest dimension. Her margins were negative and 3 of the 17 nodes sampled were involved with carcinoma. An Oncotype Dx score was 10 and Dr. Chryl Heck does not recommend systemic chemotherapy. She's seen today to discuss adjuvant radiotherapy.   PREVIOUS RADIATION THERAPY:   02/24/15: The patient received radioactive iodine for treatment of thyroid cancer.    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   not current problem, no hematologist       PAST SURGICAL HISTORY: Past Surgical  History:  Procedure Laterality Date   ABDOMINAL HYSTERECTOMY  11-18-1998   partial   BREAST BIOPSY Left 03/15/2022   [place] LT BREAST BX W LOC DEV 1ST LESION IMG BX [place] US GUIDE 03/15/2022 [facility]   BREAST BIOPSY Left 03/15/2022   [place] LT BREAST BX W LOC DEV EA ADD LESION IMG BX [place] US GUIDE 03/15/2022 [facility]   CHOLECYSTECTOMY  08/02/2011   Procedure: LAPAROSCOPIC CHOLECYSTECTOMY WITH INTRAOPERATIVE CHOLANGIOGRAM;  Surgeon: Pedro Earls, MD;  Location: [facility];  Service: General;  Laterality: N/A;   LAPAROSCOPIC GASTRIC BANDING  05/19/2008   LUMBAR [employer] SURGERY  03/21/1996   MASTECTOMY W/ SENTINEL NODE BIOPSY Left 04/14/2022   Procedure: LEFT MASTECTOMY WITH SENTINEL NODE BIOPSY;  Surgeon: Coralie Keens, MD;  Location: [facility];  Service: General;  Laterality: Left;   THYROIDECTOMY N/A 09/08/2015   Procedure: TOTAL THYROIDECTOMY;  Surgeon: Armandina Gemma, MD;  Location: [facility];  Service: General;  Laterality: N/A;   TONSILLECTOMY       FAMILY HISTORY:  Family History  Problem Relation Age of Onset   Colon polyps Mother    Heart disease Mother    Diabetes Father    Heart disease Father    Breast cancer Maternal Aunt 60 - 69   Ovarian cancer Paternal Aunt    Bladder Cancer Paternal Uncle        paternal half-uncle   Breast cancer Cousin 67 - 37     SOCIAL HISTORY:  reports that she has never smoked.  She has never used smokeless tobacco. She reports that she does not drink alcohol and does not use drugs. The patient is married and lives in [place]. 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 She is hoping to retire at the end of this year. She's accompanied by her husband.   ALLERGIES: Patient has no known allergies.   MEDICATIONS:  Current Outpatient Medications  Medication Sig Dispense Refill   ACCU-CHEK GUIDE test strip check sugar 3x daily In Vitro three times daily for 90 days     Accu-Chek  Softclix Lancets lancets USE 1 TO CHECK GLUCOSE THREE TIMES DAILY     aspirin 81 MG tablet Take 81 mg by mouth daily.      benazepril-hydrochlorthiazide (LOTENSIN HCT) 20-25 MG tablet Take 1 tablet by mouth daily.     Blood Glucose Monitoring Suppl (ACCU-CHEK GUIDE) w/Device KIT check glucose daily and as needed for 365 days     Cyanocobalamin (B-12) 500 MCG SUBL Place 1 tablet under the tongue daily.     docusate sodium (COLACE) 100 MG capsule Take 100 mg by mouth daily.     EUTHYROX 175 MCG tablet Take 175 mcg by mouth daily before breakfast.     metFORMIN (GLUCOPHAGE) 500 MG tablet 500 mg. 1 tablet with breakfast, 1 tablet with supper     metoprolol succinate (TOPROL XL) 50 MG 24 hr tablet Take 1 tablet (50 mg total) by mouth daily. Take with or immediately following a meal. 90 tablet 1   nystatin cream (MYCOSTATIN) 1 application Externally Twice a day as needed for 30 days     rosuvastatin (CRESTOR) 40 MG tablet Take 1 tablet (40 mg total) by mouth daily. 90 tablet 1   terbinafine (LAMISIL) 250 MG tablet      triamcinolone (KENALOG) Q000111Q % cream 1 application Externally Once a day as neeed     VITAMIN D3 1.25 MG (50000 UT) capsule Take 50,000 Units by mouth once a week.     No current facility-administered medications for this encounter.     REVIEW OF SYSTEMS: On review of systems, the patient reports she is doing well but is having hot flashes and is concerned about this being related to her thyroid medication. She sees Dr. Buddy Duty next week for further evaluation. She is not having any pain in the axilla or chest wall. No other complaints are verbalized.      PHYSICAL EXAM:  Wt Readings from Last 3 Encounters:  05/19/22 224 lb (101.6 kg)  05/19/22 224 lb 2 oz (101.7 kg)  05/17/22 224 lb 12.8 oz (102 kg)   Temp Readings from Last 3 Encounters:  05/19/22 (!) 97 F (36.1 C) (Temporal)  05/19/22 (!) 97 F (36.1 C) (Temporal)  05/06/22 97.9 F (36.6 C) (Temporal)   BP Readings  from Last 3 Encounters:  05/19/22 128/63  05/19/22 128/63  05/17/22 139/82   Pulse Readings from Last 3 Encounters:  05/19/22 67  05/19/22 67  05/17/22 74    In general this is a well appearing caucasian female in no acute distress. She's alert and oriented x4 and appropriate throughout the examination. Cardiopulmonary assessment is negative for acute distress and she exhibits normal effort. Her left chest wall reveals a well healed surgical site without erythema, separation or drainage. No extremity edema is noted.    ECOG = 0  0 - Asymptomatic (Fully active, able to carry on all predisease activities without restriction)  1 - Symptomatic  but completely ambulatory (Restricted in physically strenuous activity but ambulatory and able to carry out work of a light or sedentary nature. For example, light housework, office work)  2 - Symptomatic, <50% in bed during the day (Ambulatory and capable of all self care but unable to carry out any work activities. Up and about more than 50% of waking hours)  3 - Symptomatic, >50% in bed, but not bedbound (Capable of only limited self-care, confined to bed or chair 50% or more of waking hours)  4 - Bedbound (Completely disabled. Cannot carry on any self-care. Totally confined to bed or chair)  5 - Death   Eustace Pen MM, Creech RH, Tormey DC, et al. [phone]). "Toxicity and response criteria of the [facility]". [place] Oncol. 5 (6): 649-55    LABORATORY DATA:  Lab Results  Component Value Date   WBC 8.8 03/23/2022   HGB 13.6 03/23/2022   HCT 40.5 03/23/2022   MCV 89.0 03/23/2022   PLT 182 03/23/2022   Lab Results  Component Value Date   NA 140 04/11/2022   K 3.8 04/11/2022   CL 104 04/11/2022   CO2 25 04/11/2022   Lab Results  Component Value Date   ALT 14 03/25/2022   AST 17 03/23/2022   ALKPHOS 77 03/23/2022   BILITOT 0.4 03/23/2022      RADIOGRAPHY: No results found.     IMPRESSION/PLAN: 1. 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 Dr. Lisbeth Renshaw has reviewed her final pathology results and we discussed her pathology report. She has healed since surgery and does not need systemic chemotherapy based on Oncotype Dx score. She would benefit from  postmastectomy radiation to reduce risks of local recurrence. We discussed the risks, benefits, short, and long term effects of radiotherapy. We also discussed delivery and logistics of treatment. Dr. Lisbeth Renshaw recommends 6 1/2 weeks of radiation to the left chest wall and regional nodes and the patient is in agreement to proceed. Written consent is obtained and placed in the chart, a copy was provided to the patient. She will simulate this morning. 2.  History of Papillary Thyroid cancer. She follows with Dr. Buddy Duty in Endocrinology. And will be due for repeat imaging this month. This will also be followed by medical oncology.   In a visit lasting 45 minutes, greater than 50% of the time was spent face to face reviewing her case, as well as in preparation of, discussing, and coordinating the patient's care.    Carola Rhine, [facility]    **Disclaimer: This note was dictated with voice recognition software. Similar sounding words can inadvertently be transcribed and this note may contain transcription errors which may not have been corrected upon publication of note.**

## 2022-05-25 ENCOUNTER — Encounter: Payer: Self-pay | Admitting: *Deleted

## 2022-05-26 ENCOUNTER — Telehealth: Payer: Self-pay | Admitting: Hematology and Oncology

## 2022-05-26 NOTE — Telephone Encounter (Signed)
Spoke with patient confirming upcoming appointment  

## 2022-05-30 DIAGNOSIS — R9389 Abnormal findings on diagnostic imaging of other specified body structures: Secondary | ICD-10-CM | POA: Diagnosis not present

## 2022-05-30 DIAGNOSIS — C73 Malignant neoplasm of thyroid gland: Secondary | ICD-10-CM | POA: Diagnosis not present

## 2022-05-30 DIAGNOSIS — E890 Postprocedural hypothyroidism: Secondary | ICD-10-CM | POA: Diagnosis not present

## 2022-05-31 ENCOUNTER — Telehealth: Payer: Self-pay | Admitting: Hematology and Oncology

## 2022-05-31 NOTE — Telephone Encounter (Signed)
Patient called to move appointment. 

## 2022-06-01 DIAGNOSIS — E119 Type 2 diabetes mellitus without complications: Secondary | ICD-10-CM | POA: Diagnosis not present

## 2022-06-01 DIAGNOSIS — H43813 Vitreous degeneration, bilateral: Secondary | ICD-10-CM | POA: Diagnosis not present

## 2022-06-02 DIAGNOSIS — R9389 Abnormal findings on diagnostic imaging of other specified body structures: Secondary | ICD-10-CM | POA: Diagnosis not present

## 2022-06-02 DIAGNOSIS — E890 Postprocedural hypothyroidism: Secondary | ICD-10-CM | POA: Diagnosis not present

## 2022-06-02 DIAGNOSIS — C50412 Malignant neoplasm of upper-outer quadrant of left female breast: Secondary | ICD-10-CM | POA: Diagnosis not present

## 2022-06-02 DIAGNOSIS — C73 Malignant neoplasm of thyroid gland: Secondary | ICD-10-CM | POA: Diagnosis not present

## 2022-06-02 DIAGNOSIS — Z510 Encounter for antineoplastic radiation therapy: Secondary | ICD-10-CM | POA: Diagnosis not present

## 2022-06-03 ENCOUNTER — Ambulatory Visit: Payer: BC Managed Care – PPO | Admitting: Rehabilitation

## 2022-06-03 DIAGNOSIS — C50412 Malignant neoplasm of upper-outer quadrant of left female breast: Secondary | ICD-10-CM | POA: Diagnosis not present

## 2022-06-03 DIAGNOSIS — Z170 Estrogen receptor positive status [ER+]: Secondary | ICD-10-CM | POA: Diagnosis not present

## 2022-06-03 DIAGNOSIS — Z510 Encounter for antineoplastic radiation therapy: Secondary | ICD-10-CM | POA: Diagnosis not present

## 2022-06-06 ENCOUNTER — Ambulatory Visit
Admission: RE | Admit: 2022-06-06 | Discharge: 2022-06-06 | Disposition: A | Discharge status: Home / Self Care | Payer: BC Managed Care – PPO | Source: Ambulatory Visit | Attending: Radiation Oncology | Admitting: Radiation Oncology

## 2022-06-06 ENCOUNTER — Other Ambulatory Visit: Payer: Self-pay

## 2022-06-06 DIAGNOSIS — Z170 Estrogen receptor positive status [ER+]: Secondary | ICD-10-CM | POA: Diagnosis not present

## 2022-06-06 DIAGNOSIS — Z510 Encounter for antineoplastic radiation therapy: Secondary | ICD-10-CM | POA: Diagnosis not present

## 2022-06-06 DIAGNOSIS — C50412 Malignant neoplasm of upper-outer quadrant of left female breast: Secondary | ICD-10-CM | POA: Diagnosis not present

## 2022-06-06 LAB — RAD ONC ARIA SESSION SUMMARY

## 2022-06-07 ENCOUNTER — Other Ambulatory Visit: Payer: Self-pay

## 2022-06-07 ENCOUNTER — Ambulatory Visit
Admission: RE | Admit: 2022-06-07 | Discharge: 2022-06-07 | Disposition: A | Discharge status: Home / Self Care | Payer: BC Managed Care – PPO | Source: Ambulatory Visit | Attending: Radiation Oncology | Admitting: Radiation Oncology

## 2022-06-07 DIAGNOSIS — C50412 Malignant neoplasm of upper-outer quadrant of left female breast: Secondary | ICD-10-CM | POA: Diagnosis not present

## 2022-06-07 DIAGNOSIS — Z170 Estrogen receptor positive status [ER+]: Secondary | ICD-10-CM | POA: Diagnosis not present

## 2022-06-07 DIAGNOSIS — Z510 Encounter for antineoplastic radiation therapy: Secondary | ICD-10-CM | POA: Diagnosis not present

## 2022-06-07 LAB — RAD ONC ARIA SESSION SUMMARY

## 2022-06-08 ENCOUNTER — Encounter: Payer: Self-pay | Admitting: *Deleted

## 2022-06-08 ENCOUNTER — Other Ambulatory Visit: Payer: Self-pay

## 2022-06-08 ENCOUNTER — Ambulatory Visit
Admission: RE | Admit: 2022-06-08 | Discharge: 2022-06-08 | Disposition: A | Discharge status: Home / Self Care | Payer: BC Managed Care – PPO | Source: Ambulatory Visit | Attending: Radiation Oncology | Admitting: Radiation Oncology

## 2022-06-08 DIAGNOSIS — Z170 Estrogen receptor positive status [ER+]: Secondary | ICD-10-CM

## 2022-06-08 DIAGNOSIS — Z510 Encounter for antineoplastic radiation therapy: Secondary | ICD-10-CM | POA: Diagnosis not present

## 2022-06-08 DIAGNOSIS — C50412 Malignant neoplasm of upper-outer quadrant of left female breast: Secondary | ICD-10-CM | POA: Diagnosis not present

## 2022-06-08 LAB — RAD ONC ARIA SESSION SUMMARY
Course Elapsed Days: 2
Plan Fractions Treated to Date: 2
Plan Fractions Treated to Date: 3
Plan Prescribed Dose Per Fraction: 1.8 Gy
Plan Prescribed Dose Per Fraction: 1.8 Gy
Plan Total Fractions Prescribed: 14
Plan Total Fractions Prescribed: 28
Plan Total Prescribed Dose: 25.2 Gy
Plan Total Prescribed Dose: 50.4 Gy
Reference Point Dosage Given to Date: 3.6 Gy
Reference Point Dosage Given to Date: 5.4 Gy
Reference Point Session Dosage Given: 1.8 Gy
Reference Point Session Dosage Given: 1.8 Gy
Session Number: 3

## 2022-06-09 ENCOUNTER — Other Ambulatory Visit: Payer: Self-pay

## 2022-06-09 ENCOUNTER — Ambulatory Visit
Admission: RE | Admit: 2022-06-09 | Discharge: 2022-06-09 | Disposition: A | Discharge status: Home / Self Care | Payer: BC Managed Care – PPO | Source: Ambulatory Visit | Attending: Radiation Oncology | Admitting: Radiation Oncology

## 2022-06-09 DIAGNOSIS — Z170 Estrogen receptor positive status [ER+]: Secondary | ICD-10-CM | POA: Diagnosis not present

## 2022-06-09 DIAGNOSIS — C50412 Malignant neoplasm of upper-outer quadrant of left female breast: Secondary | ICD-10-CM | POA: Diagnosis not present

## 2022-06-09 DIAGNOSIS — Z510 Encounter for antineoplastic radiation therapy: Secondary | ICD-10-CM | POA: Diagnosis not present

## 2022-06-09 LAB — RAD ONC ARIA SESSION SUMMARY
Course Elapsed Days: 3
Plan Fractions Treated to Date: 2
Plan Fractions Treated to Date: 4
Plan Prescribed Dose Per Fraction: 1.8 Gy
Plan Prescribed Dose Per Fraction: 1.8 Gy
Plan Total Fractions Prescribed: 14
Plan Total Fractions Prescribed: 28
Plan Total Prescribed Dose: 25.2 Gy
Plan Total Prescribed Dose: 50.4 Gy
Reference Point Dosage Given to Date: 3.6 Gy
Reference Point Dosage Given to Date: 7.2 Gy
Reference Point Session Dosage Given: 1.8 Gy
Reference Point Session Dosage Given: 1.8 Gy
Session Number: 4

## 2022-06-10 ENCOUNTER — Ambulatory Visit
Admission: RE | Admit: 2022-06-10 | Discharge: 2022-06-10 | Disposition: A | Discharge status: Home / Self Care | Payer: BC Managed Care – PPO | Source: Ambulatory Visit | Attending: Radiation Oncology | Admitting: Radiation Oncology

## 2022-06-10 ENCOUNTER — Other Ambulatory Visit: Payer: Self-pay

## 2022-06-10 DIAGNOSIS — Z170 Estrogen receptor positive status [ER+]: Secondary | ICD-10-CM

## 2022-06-10 DIAGNOSIS — Z510 Encounter for antineoplastic radiation therapy: Secondary | ICD-10-CM | POA: Diagnosis not present

## 2022-06-10 DIAGNOSIS — C50412 Malignant neoplasm of upper-outer quadrant of left female breast: Secondary | ICD-10-CM | POA: Diagnosis not present

## 2022-06-10 LAB — RAD ONC ARIA SESSION SUMMARY
Course Elapsed Days: 4
Plan Fractions Treated to Date: 3
Plan Fractions Treated to Date: 5
Plan Prescribed Dose Per Fraction: 1.8 Gy
Plan Prescribed Dose Per Fraction: 1.8 Gy
Plan Total Fractions Prescribed: 14
Plan Total Fractions Prescribed: 28
Plan Total Prescribed Dose: 25.2 Gy
Plan Total Prescribed Dose: 50.4 Gy
Reference Point Dosage Given to Date: 5.4 Gy
Reference Point Dosage Given to Date: 9 Gy
Reference Point Session Dosage Given: 1.8 Gy
Reference Point Session Dosage Given: 1.8 Gy
Session Number: 5

## 2022-06-10 MED ORDER — RADIAPLEXRX EX GEL: Freq: Once | CUTANEOUS | Status: AC

## 2022-06-10 MED ORDER — ALRA NON-METALLIC DEODORANT (RAD-ONC)
1.0000 | Freq: Once | TOPICAL | Status: AC
  Administered 2022-06-10: 1 via TOPICAL

## 2022-06-13 ENCOUNTER — Other Ambulatory Visit: Payer: Self-pay

## 2022-06-13 ENCOUNTER — Ambulatory Visit
Admission: RE | Admit: 2022-06-13 | Discharge: 2022-06-13 | Disposition: A | Discharge status: Home / Self Care | Payer: BC Managed Care – PPO | Source: Ambulatory Visit | Attending: Radiation Oncology | Admitting: Radiation Oncology

## 2022-06-13 DIAGNOSIS — Z170 Estrogen receptor positive status [ER+]: Secondary | ICD-10-CM | POA: Diagnosis not present

## 2022-06-13 DIAGNOSIS — Z510 Encounter for antineoplastic radiation therapy: Secondary | ICD-10-CM | POA: Diagnosis not present

## 2022-06-13 DIAGNOSIS — C50412 Malignant neoplasm of upper-outer quadrant of left female breast: Secondary | ICD-10-CM | POA: Diagnosis not present

## 2022-06-13 LAB — RAD ONC ARIA SESSION SUMMARY
Course Elapsed Days: 7
Plan Fractions Treated to Date: 3
Plan Fractions Treated to Date: 6
Plan Prescribed Dose Per Fraction: 1.8 Gy
Plan Prescribed Dose Per Fraction: 1.8 Gy
Plan Total Fractions Prescribed: 14
Plan Total Fractions Prescribed: 28
Plan Total Prescribed Dose: 25.2 Gy
Plan Total Prescribed Dose: 50.4 Gy
Reference Point Dosage Given to Date: 10.8 Gy
Reference Point Dosage Given to Date: 5.4 Gy
Reference Point Session Dosage Given: 1.8 Gy
Reference Point Session Dosage Given: 1.8 Gy
Session Number: 6

## 2022-06-14 ENCOUNTER — Other Ambulatory Visit: Payer: Self-pay

## 2022-06-14 ENCOUNTER — Ambulatory Visit
Admission: RE | Admit: 2022-06-14 | Discharge: 2022-06-14 | Disposition: A | Discharge status: Home / Self Care | Payer: BC Managed Care – PPO | Source: Ambulatory Visit | Attending: Radiation Oncology | Admitting: Radiation Oncology

## 2022-06-14 DIAGNOSIS — C50412 Malignant neoplasm of upper-outer quadrant of left female breast: Secondary | ICD-10-CM | POA: Diagnosis not present

## 2022-06-14 DIAGNOSIS — Z510 Encounter for antineoplastic radiation therapy: Secondary | ICD-10-CM | POA: Diagnosis not present

## 2022-06-14 LAB — RAD ONC ARIA SESSION SUMMARY
Course Elapsed Days: 8
Plan Fractions Treated to Date: 4
Plan Fractions Treated to Date: 7
Plan Prescribed Dose Per Fraction: 1.8 Gy
Plan Prescribed Dose Per Fraction: 1.8 Gy
Plan Total Fractions Prescribed: 14
Plan Total Fractions Prescribed: 28
Plan Total Prescribed Dose: 25.2 Gy
Plan Total Prescribed Dose: 50.4 Gy
Reference Point Dosage Given to Date: 12.6 Gy
Reference Point Dosage Given to Date: 7.2 Gy
Reference Point Session Dosage Given: 1.8 Gy
Reference Point Session Dosage Given: 1.8 Gy
Session Number: 7

## 2022-06-15 ENCOUNTER — Ambulatory Visit
Admission: RE | Admit: 2022-06-15 | Discharge: 2022-06-15 | Disposition: A | Discharge status: Home / Self Care | Payer: BC Managed Care – PPO | Source: Ambulatory Visit | Attending: Radiation Oncology | Admitting: Radiation Oncology

## 2022-06-15 ENCOUNTER — Other Ambulatory Visit: Payer: Self-pay

## 2022-06-15 DIAGNOSIS — Z170 Estrogen receptor positive status [ER+]: Secondary | ICD-10-CM | POA: Diagnosis not present

## 2022-06-15 DIAGNOSIS — C50412 Malignant neoplasm of upper-outer quadrant of left female breast: Secondary | ICD-10-CM | POA: Diagnosis not present

## 2022-06-15 DIAGNOSIS — Z510 Encounter for antineoplastic radiation therapy: Secondary | ICD-10-CM | POA: Insufficient documentation

## 2022-06-15 LAB — RAD ONC ARIA SESSION SUMMARY
Course Elapsed Days: 9
Plan Fractions Treated to Date: 4
Plan Fractions Treated to Date: 8
Plan Prescribed Dose Per Fraction: 1.8 Gy
Plan Prescribed Dose Per Fraction: 1.8 Gy
Plan Total Fractions Prescribed: 14
Plan Total Fractions Prescribed: 28
Plan Total Prescribed Dose: 25.2 Gy
Plan Total Prescribed Dose: 50.4 Gy
Reference Point Dosage Given to Date: 14.4 Gy
Reference Point Dosage Given to Date: 7.2 Gy
Reference Point Session Dosage Given: 1.8 Gy
Reference Point Session Dosage Given: 1.8 Gy
Session Number: 8

## 2022-06-16 ENCOUNTER — Ambulatory Visit
Admission: RE | Admit: 2022-06-16 | Discharge: 2022-06-16 | Disposition: A | Discharge status: Home / Self Care | Payer: BC Managed Care – PPO | Source: Ambulatory Visit | Attending: Radiation Oncology | Admitting: Radiation Oncology

## 2022-06-16 ENCOUNTER — Other Ambulatory Visit: Payer: Self-pay

## 2022-06-16 DIAGNOSIS — Z510 Encounter for antineoplastic radiation therapy: Secondary | ICD-10-CM | POA: Diagnosis not present

## 2022-06-16 DIAGNOSIS — C50412 Malignant neoplasm of upper-outer quadrant of left female breast: Secondary | ICD-10-CM | POA: Diagnosis not present

## 2022-06-16 LAB — RAD ONC ARIA SESSION SUMMARY
Course Elapsed Days: 10
Plan Fractions Treated to Date: 5
Plan Fractions Treated to Date: 9
Plan Prescribed Dose Per Fraction: 1.8 Gy
Plan Prescribed Dose Per Fraction: 1.8 Gy
Plan Total Fractions Prescribed: 14
Plan Total Fractions Prescribed: 28
Plan Total Prescribed Dose: 25.2 Gy
Plan Total Prescribed Dose: 50.4 Gy
Reference Point Dosage Given to Date: 16.2 Gy
Reference Point Dosage Given to Date: 9 Gy
Reference Point Session Dosage Given: 1.8 Gy
Reference Point Session Dosage Given: 1.8 Gy
Session Number: 9

## 2022-06-17 ENCOUNTER — Other Ambulatory Visit: Payer: Self-pay

## 2022-06-17 ENCOUNTER — Ambulatory Visit
Admission: RE | Admit: 2022-06-17 | Discharge: 2022-06-17 | Disposition: A | Discharge status: Home / Self Care | Payer: BC Managed Care – PPO | Source: Ambulatory Visit | Attending: Radiation Oncology | Admitting: Radiation Oncology

## 2022-06-17 DIAGNOSIS — C50412 Malignant neoplasm of upper-outer quadrant of left female breast: Secondary | ICD-10-CM | POA: Diagnosis not present

## 2022-06-17 DIAGNOSIS — Z170 Estrogen receptor positive status [ER+]: Secondary | ICD-10-CM | POA: Diagnosis not present

## 2022-06-17 DIAGNOSIS — Z510 Encounter for antineoplastic radiation therapy: Secondary | ICD-10-CM | POA: Diagnosis not present

## 2022-06-17 LAB — RAD ONC ARIA SESSION SUMMARY
Course Elapsed Days: 11
Plan Fractions Treated to Date: 10
Plan Fractions Treated to Date: 5
Plan Prescribed Dose Per Fraction: 1.8 Gy
Plan Prescribed Dose Per Fraction: 1.8 Gy
Plan Total Fractions Prescribed: 14
Plan Total Fractions Prescribed: 28
Plan Total Prescribed Dose: 25.2 Gy
Plan Total Prescribed Dose: 50.4 Gy
Reference Point Dosage Given to Date: 18 Gy
Reference Point Dosage Given to Date: 9 Gy
Reference Point Session Dosage Given: 1.8 Gy
Reference Point Session Dosage Given: 1.8 Gy
Session Number: 10

## 2022-06-20 ENCOUNTER — Other Ambulatory Visit: Payer: Self-pay

## 2022-06-20 ENCOUNTER — Ambulatory Visit
Admission: RE | Admit: 2022-06-20 | Discharge: 2022-06-20 | Disposition: A | Discharge status: Home / Self Care | Payer: BC Managed Care – PPO | Source: Ambulatory Visit | Attending: Radiation Oncology | Admitting: Radiation Oncology

## 2022-06-20 DIAGNOSIS — Z170 Estrogen receptor positive status [ER+]: Secondary | ICD-10-CM | POA: Diagnosis not present

## 2022-06-20 DIAGNOSIS — C50412 Malignant neoplasm of upper-outer quadrant of left female breast: Secondary | ICD-10-CM | POA: Diagnosis not present

## 2022-06-20 DIAGNOSIS — Z510 Encounter for antineoplastic radiation therapy: Secondary | ICD-10-CM | POA: Diagnosis not present

## 2022-06-20 LAB — RAD ONC ARIA SESSION SUMMARY
Course Elapsed Days: 14
Plan Fractions Treated to Date: 11
Plan Fractions Treated to Date: 6
Plan Prescribed Dose Per Fraction: 1.8 Gy
Plan Prescribed Dose Per Fraction: 1.8 Gy
Plan Total Fractions Prescribed: 14
Plan Total Fractions Prescribed: 28
Plan Total Prescribed Dose: 25.2 Gy
Plan Total Prescribed Dose: 50.4 Gy
Reference Point Dosage Given to Date: 10.8 Gy
Reference Point Dosage Given to Date: 19.8 Gy
Reference Point Session Dosage Given: 1.8 Gy
Reference Point Session Dosage Given: 1.8 Gy
Session Number: 11

## 2022-06-21 ENCOUNTER — Other Ambulatory Visit: Payer: Self-pay

## 2022-06-21 ENCOUNTER — Ambulatory Visit
Admission: RE | Admit: 2022-06-21 | Discharge: 2022-06-21 | Disposition: A | Discharge status: Home / Self Care | Payer: BC Managed Care – PPO | Source: Ambulatory Visit | Attending: Radiation Oncology | Admitting: Radiation Oncology

## 2022-06-21 DIAGNOSIS — C50412 Malignant neoplasm of upper-outer quadrant of left female breast: Secondary | ICD-10-CM | POA: Diagnosis not present

## 2022-06-21 DIAGNOSIS — Z510 Encounter for antineoplastic radiation therapy: Secondary | ICD-10-CM | POA: Diagnosis not present

## 2022-06-21 DIAGNOSIS — Z170 Estrogen receptor positive status [ER+]: Secondary | ICD-10-CM | POA: Diagnosis not present

## 2022-06-21 LAB — RAD ONC ARIA SESSION SUMMARY
Course Elapsed Days: 15
Plan Fractions Treated to Date: 12
Plan Fractions Treated to Date: 6
Plan Prescribed Dose Per Fraction: 1.8 Gy
Plan Prescribed Dose Per Fraction: 1.8 Gy
Plan Total Fractions Prescribed: 14
Plan Total Fractions Prescribed: 28
Plan Total Prescribed Dose: 25.2 Gy
Plan Total Prescribed Dose: 50.4 Gy
Reference Point Dosage Given to Date: 10.8 Gy
Reference Point Dosage Given to Date: 21.6 Gy
Reference Point Session Dosage Given: 1.8 Gy
Reference Point Session Dosage Given: 1.8 Gy
Session Number: 12

## 2022-06-22 ENCOUNTER — Other Ambulatory Visit: Payer: Self-pay

## 2022-06-22 ENCOUNTER — Ambulatory Visit
Admission: RE | Admit: 2022-06-22 | Discharge: 2022-06-22 | Disposition: A | Discharge status: Home / Self Care | Payer: BC Managed Care – PPO | Source: Ambulatory Visit | Attending: Radiation Oncology | Admitting: Radiation Oncology

## 2022-06-22 DIAGNOSIS — Z170 Estrogen receptor positive status [ER+]: Secondary | ICD-10-CM | POA: Diagnosis not present

## 2022-06-22 DIAGNOSIS — Z510 Encounter for antineoplastic radiation therapy: Secondary | ICD-10-CM | POA: Diagnosis not present

## 2022-06-22 DIAGNOSIS — C50412 Malignant neoplasm of upper-outer quadrant of left female breast: Secondary | ICD-10-CM | POA: Diagnosis not present

## 2022-06-22 LAB — RAD ONC ARIA SESSION SUMMARY
Course Elapsed Days: 16
Plan Fractions Treated to Date: 13
Plan Fractions Treated to Date: 7
Plan Prescribed Dose Per Fraction: 1.8 Gy
Plan Prescribed Dose Per Fraction: 1.8 Gy
Plan Total Fractions Prescribed: 14
Plan Total Fractions Prescribed: 28
Plan Total Prescribed Dose: 25.2 Gy
Plan Total Prescribed Dose: 50.4 Gy
Reference Point Dosage Given to Date: 12.6 Gy
Reference Point Dosage Given to Date: 23.4 Gy
Reference Point Session Dosage Given: 1.8 Gy
Reference Point Session Dosage Given: 1.8 Gy
Session Number: 13

## 2022-06-23 ENCOUNTER — Other Ambulatory Visit: Payer: Self-pay

## 2022-06-23 ENCOUNTER — Ambulatory Visit
Admission: RE | Admit: 2022-06-23 | Discharge: 2022-06-23 | Disposition: A | Discharge status: Home / Self Care | Payer: BC Managed Care – PPO | Source: Ambulatory Visit | Attending: Radiation Oncology | Admitting: Radiation Oncology

## 2022-06-23 DIAGNOSIS — Z510 Encounter for antineoplastic radiation therapy: Secondary | ICD-10-CM | POA: Diagnosis not present

## 2022-06-23 DIAGNOSIS — C50412 Malignant neoplasm of upper-outer quadrant of left female breast: Secondary | ICD-10-CM | POA: Diagnosis not present

## 2022-06-23 DIAGNOSIS — Z170 Estrogen receptor positive status [ER+]: Secondary | ICD-10-CM | POA: Diagnosis not present

## 2022-06-23 LAB — RAD ONC ARIA SESSION SUMMARY
Course Elapsed Days: 17
Plan Fractions Treated to Date: 14
Plan Fractions Treated to Date: 7
Plan Prescribed Dose Per Fraction: 1.8 Gy
Plan Prescribed Dose Per Fraction: 1.8 Gy
Plan Total Fractions Prescribed: 14
Plan Total Fractions Prescribed: 28
Plan Total Prescribed Dose: 25.2 Gy
Plan Total Prescribed Dose: 50.4 Gy
Reference Point Dosage Given to Date: 12.6 Gy
Reference Point Dosage Given to Date: 25.2 Gy
Reference Point Session Dosage Given: 1.8 Gy
Reference Point Session Dosage Given: 1.8 Gy
Session Number: 14

## 2022-06-24 ENCOUNTER — Other Ambulatory Visit: Payer: Self-pay

## 2022-06-24 ENCOUNTER — Ambulatory Visit
Admission: RE | Admit: 2022-06-24 | Discharge: 2022-06-24 | Disposition: A | Discharge status: Home / Self Care | Payer: BC Managed Care – PPO | Source: Ambulatory Visit | Attending: Radiation Oncology | Admitting: Radiation Oncology

## 2022-06-24 DIAGNOSIS — C50412 Malignant neoplasm of upper-outer quadrant of left female breast: Secondary | ICD-10-CM | POA: Diagnosis not present

## 2022-06-24 DIAGNOSIS — Z510 Encounter for antineoplastic radiation therapy: Secondary | ICD-10-CM | POA: Diagnosis not present

## 2022-06-24 DIAGNOSIS — Z170 Estrogen receptor positive status [ER+]: Secondary | ICD-10-CM | POA: Diagnosis not present

## 2022-06-24 LAB — RAD ONC ARIA SESSION SUMMARY
Course Elapsed Days: 18
Plan Fractions Treated to Date: 15
Plan Fractions Treated to Date: 8
Plan Prescribed Dose Per Fraction: 1.8 Gy
Plan Prescribed Dose Per Fraction: 1.8 Gy
Plan Total Fractions Prescribed: 14
Plan Total Fractions Prescribed: 28
Plan Total Prescribed Dose: 25.2 Gy
Plan Total Prescribed Dose: 50.4 Gy
Reference Point Dosage Given to Date: 14.4 Gy
Reference Point Dosage Given to Date: 27 Gy
Reference Point Session Dosage Given: 1.8 Gy
Reference Point Session Dosage Given: 1.8 Gy
Session Number: 15

## 2022-06-27 ENCOUNTER — Ambulatory Visit
Admission: RE | Admit: 2022-06-27 | Discharge: 2022-06-27 | Disposition: A | Discharge status: Home / Self Care | Payer: BC Managed Care – PPO | Source: Ambulatory Visit | Attending: Radiation Oncology | Admitting: Radiation Oncology

## 2022-06-27 ENCOUNTER — Other Ambulatory Visit: Payer: Self-pay

## 2022-06-27 DIAGNOSIS — Z510 Encounter for antineoplastic radiation therapy: Secondary | ICD-10-CM | POA: Diagnosis not present

## 2022-06-27 DIAGNOSIS — Z170 Estrogen receptor positive status [ER+]: Secondary | ICD-10-CM | POA: Diagnosis not present

## 2022-06-27 DIAGNOSIS — C50412 Malignant neoplasm of upper-outer quadrant of left female breast: Secondary | ICD-10-CM | POA: Diagnosis not present

## 2022-06-27 LAB — RAD ONC ARIA SESSION SUMMARY
Course Elapsed Days: 21
Plan Fractions Treated to Date: 16
Plan Fractions Treated to Date: 8
Plan Prescribed Dose Per Fraction: 1.8 Gy
Plan Prescribed Dose Per Fraction: 1.8 Gy
Plan Total Fractions Prescribed: 14
Plan Total Fractions Prescribed: 28
Plan Total Prescribed Dose: 25.2 Gy
Plan Total Prescribed Dose: 50.4 Gy
Reference Point Dosage Given to Date: 14.4 Gy
Reference Point Dosage Given to Date: 28.8 Gy
Reference Point Session Dosage Given: 1.8 Gy
Reference Point Session Dosage Given: 1.8 Gy
Session Number: 16

## 2022-06-28 ENCOUNTER — Other Ambulatory Visit: Payer: Self-pay

## 2022-06-28 ENCOUNTER — Ambulatory Visit
Admission: RE | Admit: 2022-06-28 | Discharge: 2022-06-28 | Disposition: A | Discharge status: Home / Self Care | Payer: BC Managed Care – PPO | Source: Ambulatory Visit | Attending: Radiation Oncology | Admitting: Radiation Oncology

## 2022-06-28 DIAGNOSIS — Z170 Estrogen receptor positive status [ER+]: Secondary | ICD-10-CM | POA: Diagnosis not present

## 2022-06-28 DIAGNOSIS — Z510 Encounter for antineoplastic radiation therapy: Secondary | ICD-10-CM | POA: Diagnosis not present

## 2022-06-28 DIAGNOSIS — C50412 Malignant neoplasm of upper-outer quadrant of left female breast: Secondary | ICD-10-CM | POA: Diagnosis not present

## 2022-06-28 LAB — RAD ONC ARIA SESSION SUMMARY
Course Elapsed Days: 22
Plan Fractions Treated to Date: 17
Plan Fractions Treated to Date: 9
Plan Prescribed Dose Per Fraction: 1.8 Gy
Plan Prescribed Dose Per Fraction: 1.8 Gy
Plan Total Fractions Prescribed: 14
Plan Total Fractions Prescribed: 28
Plan Total Prescribed Dose: 25.2 Gy
Plan Total Prescribed Dose: 50.4 Gy
Reference Point Dosage Given to Date: 16.2 Gy
Reference Point Dosage Given to Date: 30.6 Gy
Reference Point Session Dosage Given: 1.8 Gy
Reference Point Session Dosage Given: 1.8 Gy
Session Number: 17

## 2022-06-29 ENCOUNTER — Ambulatory Visit
Admission: RE | Admit: 2022-06-29 | Discharge: 2022-06-29 | Disposition: A | Discharge status: Home / Self Care | Payer: BC Managed Care – PPO | Source: Ambulatory Visit | Attending: Radiation Oncology | Admitting: Radiation Oncology

## 2022-06-29 ENCOUNTER — Other Ambulatory Visit: Payer: Self-pay

## 2022-06-29 DIAGNOSIS — Z510 Encounter for antineoplastic radiation therapy: Secondary | ICD-10-CM | POA: Diagnosis not present

## 2022-06-29 DIAGNOSIS — Z170 Estrogen receptor positive status [ER+]: Secondary | ICD-10-CM | POA: Diagnosis not present

## 2022-06-29 DIAGNOSIS — C50412 Malignant neoplasm of upper-outer quadrant of left female breast: Secondary | ICD-10-CM | POA: Diagnosis not present

## 2022-06-29 LAB — RAD ONC ARIA SESSION SUMMARY
Course Elapsed Days: 23
Plan Fractions Treated to Date: 18
Plan Fractions Treated to Date: 9
Plan Prescribed Dose Per Fraction: 1.8 Gy
Plan Prescribed Dose Per Fraction: 1.8 Gy
Plan Total Fractions Prescribed: 14
Plan Total Fractions Prescribed: 28
Plan Total Prescribed Dose: 25.2 Gy
Plan Total Prescribed Dose: 50.4 Gy
Reference Point Dosage Given to Date: 16.2 Gy
Reference Point Dosage Given to Date: 32.4 Gy
Reference Point Session Dosage Given: 1.8 Gy
Reference Point Session Dosage Given: 1.8 Gy
Session Number: 18

## 2022-06-30 ENCOUNTER — Other Ambulatory Visit: Payer: Self-pay

## 2022-06-30 ENCOUNTER — Ambulatory Visit
Admission: RE | Admit: 2022-06-30 | Discharge: 2022-06-30 | Disposition: A | Discharge status: Home / Self Care | Payer: BC Managed Care – PPO | Source: Ambulatory Visit | Attending: Radiation Oncology | Admitting: Radiation Oncology

## 2022-06-30 DIAGNOSIS — C50412 Malignant neoplasm of upper-outer quadrant of left female breast: Secondary | ICD-10-CM | POA: Diagnosis not present

## 2022-06-30 DIAGNOSIS — Z510 Encounter for antineoplastic radiation therapy: Secondary | ICD-10-CM | POA: Diagnosis not present

## 2022-06-30 DIAGNOSIS — Z170 Estrogen receptor positive status [ER+]: Secondary | ICD-10-CM | POA: Diagnosis not present

## 2022-06-30 LAB — RAD ONC ARIA SESSION SUMMARY
Course Elapsed Days: 24
Plan Fractions Treated to Date: 10
Plan Fractions Treated to Date: 19
Plan Prescribed Dose Per Fraction: 1.8 Gy
Plan Prescribed Dose Per Fraction: 1.8 Gy
Plan Total Fractions Prescribed: 14
Plan Total Fractions Prescribed: 28
Plan Total Prescribed Dose: 25.2 Gy
Plan Total Prescribed Dose: 50.4 Gy
Reference Point Dosage Given to Date: 18 Gy
Reference Point Dosage Given to Date: 34.2 Gy
Reference Point Session Dosage Given: 1.8 Gy
Reference Point Session Dosage Given: 1.8 Gy
Session Number: 19

## 2022-07-01 ENCOUNTER — Ambulatory Visit
Admission: RE | Admit: 2022-07-01 | Discharge: 2022-07-01 | Disposition: A | Discharge status: Home / Self Care | Payer: BC Managed Care – PPO | Source: Ambulatory Visit | Attending: Radiation Oncology | Admitting: Radiation Oncology

## 2022-07-01 ENCOUNTER — Other Ambulatory Visit: Payer: Self-pay

## 2022-07-01 DIAGNOSIS — C50412 Malignant neoplasm of upper-outer quadrant of left female breast: Secondary | ICD-10-CM | POA: Diagnosis not present

## 2022-07-01 DIAGNOSIS — Z170 Estrogen receptor positive status [ER+]: Secondary | ICD-10-CM | POA: Diagnosis not present

## 2022-07-01 DIAGNOSIS — Z510 Encounter for antineoplastic radiation therapy: Secondary | ICD-10-CM | POA: Diagnosis not present

## 2022-07-01 LAB — RAD ONC ARIA SESSION SUMMARY
Course Elapsed Days: 25
Plan Fractions Treated to Date: 10
Plan Fractions Treated to Date: 20
Plan Prescribed Dose Per Fraction: 1.8 Gy
Plan Prescribed Dose Per Fraction: 1.8 Gy
Plan Total Fractions Prescribed: 14
Plan Total Fractions Prescribed: 28
Plan Total Prescribed Dose: 25.2 Gy
Plan Total Prescribed Dose: 50.4 Gy
Reference Point Dosage Given to Date: 18 Gy
Reference Point Dosage Given to Date: 36 Gy
Reference Point Session Dosage Given: 1.8 Gy
Reference Point Session Dosage Given: 1.8 Gy
Session Number: 20

## 2022-07-04 ENCOUNTER — Other Ambulatory Visit: Payer: Self-pay

## 2022-07-04 ENCOUNTER — Ambulatory Visit
Admission: RE | Admit: 2022-07-04 | Discharge: 2022-07-04 | Disposition: A | Discharge status: Home / Self Care | Payer: BC Managed Care – PPO | Source: Ambulatory Visit | Attending: Radiation Oncology | Admitting: Radiation Oncology

## 2022-07-04 DIAGNOSIS — Z170 Estrogen receptor positive status [ER+]: Secondary | ICD-10-CM | POA: Diagnosis not present

## 2022-07-04 DIAGNOSIS — Z510 Encounter for antineoplastic radiation therapy: Secondary | ICD-10-CM | POA: Diagnosis not present

## 2022-07-04 DIAGNOSIS — C50412 Malignant neoplasm of upper-outer quadrant of left female breast: Secondary | ICD-10-CM | POA: Diagnosis not present

## 2022-07-04 LAB — RAD ONC ARIA SESSION SUMMARY
Course Elapsed Days: 28
Plan Fractions Treated to Date: 11
Plan Fractions Treated to Date: 21
Plan Prescribed Dose Per Fraction: 1.8 Gy
Plan Prescribed Dose Per Fraction: 1.8 Gy
Plan Total Fractions Prescribed: 14
Plan Total Fractions Prescribed: 28
Plan Total Prescribed Dose: 25.2 Gy
Plan Total Prescribed Dose: 50.4 Gy
Reference Point Dosage Given to Date: 19.8 Gy
Reference Point Dosage Given to Date: 37.8 Gy
Reference Point Session Dosage Given: 1.8 Gy
Reference Point Session Dosage Given: 1.8 Gy
Session Number: 21

## 2022-07-05 ENCOUNTER — Other Ambulatory Visit: Payer: Self-pay

## 2022-07-05 ENCOUNTER — Ambulatory Visit
Admission: RE | Admit: 2022-07-05 | Discharge: 2022-07-05 | Disposition: A | Discharge status: Home / Self Care | Payer: BC Managed Care – PPO | Source: Ambulatory Visit | Attending: Radiation Oncology | Admitting: Radiation Oncology

## 2022-07-05 DIAGNOSIS — Z170 Estrogen receptor positive status [ER+]: Secondary | ICD-10-CM | POA: Diagnosis not present

## 2022-07-05 DIAGNOSIS — Z510 Encounter for antineoplastic radiation therapy: Secondary | ICD-10-CM | POA: Diagnosis not present

## 2022-07-05 DIAGNOSIS — C50412 Malignant neoplasm of upper-outer quadrant of left female breast: Secondary | ICD-10-CM | POA: Diagnosis not present

## 2022-07-05 LAB — RAD ONC ARIA SESSION SUMMARY
Course Elapsed Days: 29
Plan Fractions Treated to Date: 11
Plan Fractions Treated to Date: 22
Plan Prescribed Dose Per Fraction: 1.8 Gy
Plan Prescribed Dose Per Fraction: 1.8 Gy
Plan Total Fractions Prescribed: 14
Plan Total Fractions Prescribed: 28
Plan Total Prescribed Dose: 25.2 Gy
Plan Total Prescribed Dose: 50.4 Gy
Reference Point Dosage Given to Date: 19.8 Gy
Reference Point Dosage Given to Date: 39.6 Gy
Reference Point Session Dosage Given: 1.8 Gy
Reference Point Session Dosage Given: 1.8 Gy
Session Number: 22

## 2022-07-06 ENCOUNTER — Ambulatory Visit
Admission: RE | Admit: 2022-07-06 | Discharge: 2022-07-06 | Disposition: A | Discharge status: Home / Self Care | Payer: BC Managed Care – PPO | Source: Ambulatory Visit | Attending: Radiation Oncology | Admitting: Radiation Oncology

## 2022-07-06 ENCOUNTER — Other Ambulatory Visit: Payer: Self-pay

## 2022-07-06 DIAGNOSIS — Z170 Estrogen receptor positive status [ER+]: Secondary | ICD-10-CM | POA: Diagnosis not present

## 2022-07-06 DIAGNOSIS — C50412 Malignant neoplasm of upper-outer quadrant of left female breast: Secondary | ICD-10-CM | POA: Diagnosis not present

## 2022-07-06 DIAGNOSIS — Z510 Encounter for antineoplastic radiation therapy: Secondary | ICD-10-CM | POA: Diagnosis not present

## 2022-07-06 LAB — RAD ONC ARIA SESSION SUMMARY
Course Elapsed Days: 30
Plan Fractions Treated to Date: 12
Plan Fractions Treated to Date: 23
Plan Prescribed Dose Per Fraction: 1.8 Gy
Plan Prescribed Dose Per Fraction: 1.8 Gy
Plan Total Fractions Prescribed: 14
Plan Total Fractions Prescribed: 28
Plan Total Prescribed Dose: 25.2 Gy
Plan Total Prescribed Dose: 50.4 Gy
Reference Point Dosage Given to Date: 21.6 Gy
Reference Point Dosage Given to Date: 41.4 Gy
Reference Point Session Dosage Given: 1.8 Gy
Reference Point Session Dosage Given: 1.8 Gy
Session Number: 23

## 2022-07-07 ENCOUNTER — Ambulatory Visit
Admission: RE | Admit: 2022-07-07 | Discharge: 2022-07-07 | Disposition: A | Discharge status: Home / Self Care | Payer: BC Managed Care – PPO | Source: Ambulatory Visit | Attending: Radiation Oncology | Admitting: Radiation Oncology

## 2022-07-07 ENCOUNTER — Other Ambulatory Visit: Payer: Self-pay

## 2022-07-07 DIAGNOSIS — Z170 Estrogen receptor positive status [ER+]: Secondary | ICD-10-CM | POA: Diagnosis not present

## 2022-07-07 DIAGNOSIS — C50412 Malignant neoplasm of upper-outer quadrant of left female breast: Secondary | ICD-10-CM | POA: Diagnosis not present

## 2022-07-07 DIAGNOSIS — Z510 Encounter for antineoplastic radiation therapy: Secondary | ICD-10-CM | POA: Diagnosis not present

## 2022-07-07 LAB — RAD ONC ARIA SESSION SUMMARY
Course Elapsed Days: 31
Plan Fractions Treated to Date: 12
Plan Fractions Treated to Date: 24
Plan Prescribed Dose Per Fraction: 1.8 Gy
Plan Prescribed Dose Per Fraction: 1.8 Gy
Plan Total Fractions Prescribed: 14
Plan Total Fractions Prescribed: 28
Plan Total Prescribed Dose: 25.2 Gy
Plan Total Prescribed Dose: 50.4 Gy
Reference Point Dosage Given to Date: 21.6 Gy
Reference Point Dosage Given to Date: 43.2 Gy
Reference Point Session Dosage Given: 1.8 Gy
Reference Point Session Dosage Given: 1.8 Gy
Session Number: 24

## 2022-07-08 ENCOUNTER — Other Ambulatory Visit: Payer: Self-pay

## 2022-07-08 ENCOUNTER — Ambulatory Visit
Admission: RE | Admit: 2022-07-08 | Discharge: 2022-07-08 | Disposition: A | Discharge status: Home / Self Care | Payer: BC Managed Care – PPO | Source: Ambulatory Visit | Attending: Radiation Oncology | Admitting: Radiation Oncology

## 2022-07-08 DIAGNOSIS — Z510 Encounter for antineoplastic radiation therapy: Secondary | ICD-10-CM | POA: Diagnosis not present

## 2022-07-08 DIAGNOSIS — Z170 Estrogen receptor positive status [ER+]: Secondary | ICD-10-CM | POA: Diagnosis not present

## 2022-07-08 DIAGNOSIS — C50412 Malignant neoplasm of upper-outer quadrant of left female breast: Secondary | ICD-10-CM | POA: Diagnosis not present

## 2022-07-08 LAB — RAD ONC ARIA SESSION SUMMARY
Course Elapsed Days: 32
Plan Fractions Treated to Date: 13
Plan Fractions Treated to Date: 25
Plan Prescribed Dose Per Fraction: 1.8 Gy
Plan Prescribed Dose Per Fraction: 1.8 Gy
Plan Total Fractions Prescribed: 14
Plan Total Fractions Prescribed: 28
Plan Total Prescribed Dose: 25.2 Gy
Plan Total Prescribed Dose: 50.4 Gy
Reference Point Dosage Given to Date: 23.4 Gy
Reference Point Dosage Given to Date: 45 Gy
Reference Point Session Dosage Given: 1.8 Gy
Reference Point Session Dosage Given: 1.8 Gy
Session Number: 25

## 2022-07-12 ENCOUNTER — Other Ambulatory Visit: Payer: Self-pay

## 2022-07-12 ENCOUNTER — Ambulatory Visit
Admission: RE | Admit: 2022-07-12 | Discharge: 2022-07-12 | Disposition: A | Discharge status: Home / Self Care | Payer: BC Managed Care – PPO | Source: Ambulatory Visit | Attending: Radiation Oncology | Admitting: Radiation Oncology

## 2022-07-12 DIAGNOSIS — Z170 Estrogen receptor positive status [ER+]: Secondary | ICD-10-CM | POA: Diagnosis not present

## 2022-07-12 DIAGNOSIS — C50412 Malignant neoplasm of upper-outer quadrant of left female breast: Secondary | ICD-10-CM | POA: Diagnosis not present

## 2022-07-12 DIAGNOSIS — Z510 Encounter for antineoplastic radiation therapy: Secondary | ICD-10-CM | POA: Diagnosis not present

## 2022-07-12 LAB — RAD ONC ARIA SESSION SUMMARY
Course Elapsed Days: 36
Plan Fractions Treated to Date: 13
Plan Fractions Treated to Date: 26
Plan Prescribed Dose Per Fraction: 1.8 Gy
Plan Prescribed Dose Per Fraction: 1.8 Gy
Plan Total Fractions Prescribed: 14
Plan Total Fractions Prescribed: 28
Plan Total Prescribed Dose: 25.2 Gy
Plan Total Prescribed Dose: 50.4 Gy
Reference Point Dosage Given to Date: 23.4 Gy
Reference Point Dosage Given to Date: 46.8 Gy
Reference Point Session Dosage Given: 1.8 Gy
Reference Point Session Dosage Given: 1.8 Gy
Session Number: 26

## 2022-07-13 ENCOUNTER — Other Ambulatory Visit: Payer: Self-pay

## 2022-07-13 ENCOUNTER — Ambulatory Visit
Admission: RE | Admit: 2022-07-13 | Discharge: 2022-07-13 | Disposition: A | Discharge status: Home / Self Care | Payer: BC Managed Care – PPO | Source: Ambulatory Visit | Attending: Radiation Oncology | Admitting: Radiation Oncology

## 2022-07-13 DIAGNOSIS — Z510 Encounter for antineoplastic radiation therapy: Secondary | ICD-10-CM | POA: Diagnosis not present

## 2022-07-13 DIAGNOSIS — C50412 Malignant neoplasm of upper-outer quadrant of left female breast: Secondary | ICD-10-CM | POA: Diagnosis not present

## 2022-07-13 LAB — RAD ONC ARIA SESSION SUMMARY
Course Elapsed Days: 37
Plan Fractions Treated to Date: 14
Plan Fractions Treated to Date: 27
Plan Prescribed Dose Per Fraction: 1.8 Gy
Plan Prescribed Dose Per Fraction: 1.8 Gy
Plan Total Fractions Prescribed: 14
Plan Total Fractions Prescribed: 28
Plan Total Prescribed Dose: 25.2 Gy
Plan Total Prescribed Dose: 50.4 Gy
Reference Point Dosage Given to Date: 25.2 Gy
Reference Point Dosage Given to Date: 48.6 Gy
Reference Point Session Dosage Given: 1.8 Gy
Reference Point Session Dosage Given: 1.8 Gy
Session Number: 27

## 2022-07-14 ENCOUNTER — Other Ambulatory Visit: Payer: Self-pay

## 2022-07-14 ENCOUNTER — Ambulatory Visit
Admission: RE | Admit: 2022-07-14 | Discharge: 2022-07-14 | Disposition: A | Discharge status: Home / Self Care | Payer: BC Managed Care – PPO | Source: Ambulatory Visit | Attending: Radiation Oncology | Admitting: Radiation Oncology

## 2022-07-14 DIAGNOSIS — Z510 Encounter for antineoplastic radiation therapy: Secondary | ICD-10-CM | POA: Diagnosis not present

## 2022-07-14 DIAGNOSIS — C50412 Malignant neoplasm of upper-outer quadrant of left female breast: Secondary | ICD-10-CM | POA: Diagnosis not present

## 2022-07-14 DIAGNOSIS — Z170 Estrogen receptor positive status [ER+]: Secondary | ICD-10-CM | POA: Diagnosis not present

## 2022-07-14 LAB — RAD ONC ARIA SESSION SUMMARY
Course Elapsed Days: 38
Plan Fractions Treated to Date: 14
Plan Fractions Treated to Date: 28
Plan Prescribed Dose Per Fraction: 1.8 Gy
Plan Prescribed Dose Per Fraction: 1.8 Gy
Plan Total Fractions Prescribed: 14
Plan Total Fractions Prescribed: 28
Plan Total Prescribed Dose: 25.2 Gy
Plan Total Prescribed Dose: 50.4 Gy
Reference Point Dosage Given to Date: 25.2 Gy
Reference Point Dosage Given to Date: 50.4 Gy
Reference Point Session Dosage Given: 1.8 Gy
Reference Point Session Dosage Given: 1.8 Gy
Session Number: 28

## 2022-07-15 ENCOUNTER — Ambulatory Visit
Admission: RE | Admit: 2022-07-15 | Discharge: 2022-07-15 | Disposition: A | Discharge status: Home / Self Care | Payer: BC Managed Care – PPO | Source: Ambulatory Visit | Attending: Radiation Oncology | Admitting: Radiation Oncology

## 2022-07-15 ENCOUNTER — Other Ambulatory Visit: Payer: Self-pay

## 2022-07-15 DIAGNOSIS — C50412 Malignant neoplasm of upper-outer quadrant of left female breast: Secondary | ICD-10-CM | POA: Diagnosis not present

## 2022-07-15 DIAGNOSIS — Z510 Encounter for antineoplastic radiation therapy: Secondary | ICD-10-CM | POA: Diagnosis not present

## 2022-07-15 LAB — RAD ONC ARIA SESSION SUMMARY
Course Elapsed Days: 39
Plan Fractions Treated to Date: 1
Plan Prescribed Dose Per Fraction: 2 Gy
Plan Total Fractions Prescribed: 5
Plan Total Prescribed Dose: 10 Gy
Reference Point Dosage Given to Date: 2 Gy
Reference Point Session Dosage Given: 2 Gy
Session Number: 29

## 2022-07-18 ENCOUNTER — Other Ambulatory Visit: Payer: Self-pay

## 2022-07-18 ENCOUNTER — Ambulatory Visit
Admission: RE | Admit: 2022-07-18 | Discharge: 2022-07-18 | Disposition: A | Discharge status: Home / Self Care | Payer: BC Managed Care – PPO | Source: Ambulatory Visit | Attending: Radiation Oncology | Admitting: Radiation Oncology

## 2022-07-18 DIAGNOSIS — C50412 Malignant neoplasm of upper-outer quadrant of left female breast: Secondary | ICD-10-CM | POA: Diagnosis not present

## 2022-07-18 DIAGNOSIS — Z170 Estrogen receptor positive status [ER+]: Secondary | ICD-10-CM | POA: Diagnosis not present

## 2022-07-18 DIAGNOSIS — Z510 Encounter for antineoplastic radiation therapy: Secondary | ICD-10-CM | POA: Insufficient documentation

## 2022-07-18 LAB — RAD ONC ARIA SESSION SUMMARY
Course Elapsed Days: 42
Plan Fractions Treated to Date: 2
Plan Prescribed Dose Per Fraction: 2 Gy
Plan Total Fractions Prescribed: 5
Plan Total Prescribed Dose: 10 Gy
Reference Point Dosage Given to Date: 4 Gy
Reference Point Session Dosage Given: 2 Gy
Session Number: 30

## 2022-07-19 ENCOUNTER — Other Ambulatory Visit: Payer: Self-pay

## 2022-07-19 ENCOUNTER — Ambulatory Visit: Payer: BC Managed Care – PPO | Admitting: Hematology and Oncology

## 2022-07-19 ENCOUNTER — Ambulatory Visit
Admission: RE | Admit: 2022-07-19 | Discharge: 2022-07-19 | Disposition: A | Discharge status: Home / Self Care | Payer: BC Managed Care – PPO | Source: Ambulatory Visit | Attending: Radiation Oncology | Admitting: Radiation Oncology

## 2022-07-19 DIAGNOSIS — C50412 Malignant neoplasm of upper-outer quadrant of left female breast: Secondary | ICD-10-CM | POA: Diagnosis not present

## 2022-07-19 DIAGNOSIS — Z510 Encounter for antineoplastic radiation therapy: Secondary | ICD-10-CM | POA: Diagnosis not present

## 2022-07-19 LAB — RAD ONC ARIA SESSION SUMMARY
Course Elapsed Days: 43
Plan Fractions Treated to Date: 3
Plan Prescribed Dose Per Fraction: 2 Gy
Plan Total Fractions Prescribed: 5
Plan Total Prescribed Dose: 10 Gy
Reference Point Dosage Given to Date: 6 Gy
Reference Point Session Dosage Given: 2 Gy
Session Number: 31

## 2022-07-20 ENCOUNTER — Ambulatory Visit
Admission: RE | Admit: 2022-07-20 | Discharge: 2022-07-20 | Disposition: A | Discharge status: Home / Self Care | Payer: BC Managed Care – PPO | Source: Ambulatory Visit | Attending: Radiation Oncology | Admitting: Radiation Oncology

## 2022-07-20 ENCOUNTER — Other Ambulatory Visit: Payer: Self-pay

## 2022-07-20 DIAGNOSIS — C50412 Malignant neoplasm of upper-outer quadrant of left female breast: Secondary | ICD-10-CM | POA: Diagnosis not present

## 2022-07-20 DIAGNOSIS — Z510 Encounter for antineoplastic radiation therapy: Secondary | ICD-10-CM | POA: Diagnosis not present

## 2022-07-20 LAB — RAD ONC ARIA SESSION SUMMARY
Course Elapsed Days: 44
Plan Fractions Treated to Date: 4
Plan Prescribed Dose Per Fraction: 2 Gy
Plan Total Fractions Prescribed: 5
Plan Total Prescribed Dose: 10 Gy
Reference Point Dosage Given to Date: 8 Gy
Reference Point Session Dosage Given: 2 Gy
Session Number: 32

## 2022-07-21 ENCOUNTER — Ambulatory Visit
Admission: RE | Admit: 2022-07-21 | Discharge: 2022-07-21 | Disposition: A | Discharge status: Home / Self Care | Payer: BC Managed Care – PPO | Source: Ambulatory Visit | Attending: Radiation Oncology | Admitting: Radiation Oncology

## 2022-07-21 ENCOUNTER — Other Ambulatory Visit: Payer: Self-pay

## 2022-07-21 DIAGNOSIS — Z170 Estrogen receptor positive status [ER+]: Secondary | ICD-10-CM | POA: Diagnosis not present

## 2022-07-21 DIAGNOSIS — Z510 Encounter for antineoplastic radiation therapy: Secondary | ICD-10-CM | POA: Diagnosis not present

## 2022-07-21 DIAGNOSIS — C50412 Malignant neoplasm of upper-outer quadrant of left female breast: Secondary | ICD-10-CM | POA: Diagnosis not present

## 2022-07-21 LAB — RAD ONC ARIA SESSION SUMMARY
Course Elapsed Days: 45
Plan Fractions Treated to Date: 5
Plan Prescribed Dose Per Fraction: 2 Gy
Plan Total Fractions Prescribed: 5
Plan Total Prescribed Dose: 10 Gy
Reference Point Dosage Given to Date: 10 Gy
Reference Point Session Dosage Given: 2 Gy
Session Number: 33

## 2022-07-25 NOTE — Progress Notes (Signed)
[facility] CONSULT NOTE  Patient Care Team: Camie Patience, FNP as PCP - General (Family Medicine) Meriam Sprague, MD as PCP - Cardiology (Cardiology) Means, Raliegh Ip, RN as Registered Nurse (General Practice) Abigail Miyamoto, MD as Consulting Physician (General Surgery) Rachel Moulds, MD as Consulting Physician (Hematology and Oncology) Dorothy Puffer, MD as Consulting Physician (Radiation Oncology) Pershing Proud, RN as Oncology Nurse Navigator Donnelly Angelica, RN as Oncology Nurse Navigator  CHIEF COMPLAINTS/PURPOSE OF CONSULTATION:  Newly diagnosed breast cancer  HISTORY OF PRESENTING ILLNESS:  Lauren [name] 67 y.o. female is here because of recent diagnosis of left breast cancer.  I reviewed her records extensively and collaborated the history with the patient.  SUMMARY OF ONCOLOGIC HISTORY: Oncology History  Malignant neoplasm of upper-outer quadrant of left breast in female, estrogen receptor positive (HCC)  03/04/2022 Mammogram   Screening mammogram showed calcs and possible distortion in the left breast. Diag mammogram showed suspicious calcs and non mass finding in the 2 0 clock location of the left breast warranting further tissue diagnosis. No left axillary adenopathy   03/10/2022 Breast US   Breast ultrasound showed suspicious calcs and non-mass finding in the 2:00 location of the left breast with no evidence of left axillary adenopathy.  The area measured approximately 4.9 cm in largest dimension.   03/21/2022 Initial Diagnosis   Malignant neoplasm of upper-outer quadrant of left breast in female, estrogen receptor positive (HCC)   03/23/2022 Cancer Staging   Staging form: Breast, AJCC 8th Edition - Pathologic: Stage IA (pT1b, pN1a, cM0, G2, ER+, PR+, HER2-) - Signed by Rachel Moulds, MD on 05/06/2022 Histologic grading system: 3 grade system   03/25/2022 Pathology Results   Pathology showed grade 2 IDC, ER positive, PR positive, her 2 neg, Ki  67 30%    Genetic Testing   [facility] Multi-Cancer Panel+RNA was Negative. Report date is 03/31/2022.  The Multi-Cancer + RNA Panel offered by [facility] includes sequencing and/or deletion/duplication analysis of the following 70 genes:  AIP*, ALK, APC*, ATM*, AXIN2*, BAP1*, BARD1*, BLM*, BMPR1A*, BRCA1*, BRCA2*, BRIP1*, CDC73*, CDH1*, CDK4, CDKN1B*, CDKN2A, CHEK2*, CTNNA1*, DICER1*, EPCAM (del/dup only), EGFR, FH*, FLCN*, GREM1 (promoter dup only), HOXB13, KIT, LZTR1, MAX*, MBD4, MEN1*, MET, MITF, MLH1*, MSH2*, MSH3*, MSH6*, MUTYH*, NF1*, NF2*, NTHL1*, PALB2*, PDGFRA, PMS2*, POLD1*, POLE*, POT1*, PRKAR1A*, PTCH1*, PTEN*, RAD51C*, RAD51D*, RB1*, RET, SDHA* (sequencing only), SDHAF2*, SDHB*, SDHC*, SDHD*, SMAD4*, SMARCA4*, SMARCB1*, SMARCE1*, STK11*, SUFU*, TMEM127*, TP53*, TSC1*, TSC2*, VHL*. RNA analysis is performed for * genes.   05/16/2022 Cancer Staging   Staging form: Breast, AJCC 8th Edition - Pathologic stage from 05/16/2022: Stage IA (pT1b, pN1a, cM0, G2, ER+, PR+, HER2-) - Signed by Ronny Bacon, PA-C on 05/16/2022 Stage prefix: Initial diagnosis Histologic grading system: 3 grade system    She is now status post left mastectomy, extensive DCIS intermediate nuclear grade, invasive moderately differentiated adenocarcinoma grade 2, invasive tumor measures 8 mm in greatest dimension.  Margins free.  Left axillary lymph nodes regional resection which showed 3 of 17 lymph nodes with metastatic carcinoma showing marked fatty replacement.  Negative for extracapsular extension.  Prior prognostic showed ER +95% strong staining PR 90% moderate to strong staining HER2 negative 1+ Ki-67 of 30%. Rest of the pertinent 10 point ROS reviewed and negative.  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   not current problem,  no hematologist    SURGICAL HISTORY: Past Surgical History:  Procedure Laterality Date   ABDOMINAL HYSTERECTOMY  11-18-1998   partial   BREAST BIOPSY Left 03/15/2022   [place] LT BREAST BX W LOC DEV 1ST LESION IMG BX SPEC US GUIDE 03/15/2022 [facility]   BREAST BIOPSY Left 03/15/2022   [place] LT BREAST BX W LOC DEV EA ADD LESION IMG BX SPEC US GUIDE 03/15/2022 [facility]   CHOLECYSTECTOMY  08/02/2011   Procedure: LAPAROSCOPIC CHOLECYSTECTOMY WITH INTRAOPERATIVE CHOLANGIOGRAM;  Surgeon: Valarie Merino, MD;  Location: [facility];  Service: General;  Laterality: N/A;   LAPAROSCOPIC GASTRIC BANDING  05/19/2008   LUMBAR DISC SURGERY  03/21/1996   MASTECTOMY W/ SENTINEL NODE BIOPSY Left 04/14/2022   Procedure: LEFT MASTECTOMY WITH SENTINEL NODE BIOPSY;  Surgeon: Abigail Miyamoto, MD;  Location: [facility];  Service: General;  Laterality: Left;   THYROIDECTOMY N/A 09/08/2015   Procedure: TOTAL THYROIDECTOMY;  Surgeon: Darnell Level, MD;  Location: [facility];  Service: General;  Laterality: N/A;   TONSILLECTOMY      SOCIAL HISTORY: Social History   Socioeconomic History   Marital status: Married    Spouse name: Not on file   Number of children: Not on file   Years of education: Not on file   Highest education level: Not on file  Occupational History   Not on file  Tobacco Use   Smoking status: Never   Smokeless tobacco: Never  Substance and Sexual Activity   Alcohol use: No   Drug use: No   Sexual activity: Yes  Other Topics Concern   Not on file  Social History Narrative   Not on file   Social Determinants of Health   Financial Resource Strain: Low Risk  (03/23/2022)   Overall Financial Resource Strain (CARDIA)    Difficulty of Paying Living Expenses: Not hard at all  Food Insecurity: No Food Insecurity (03/23/2022)   Hunger Vital Sign    Worried About Running Out of Food in the Last Year: Never true    Ran Out of Food in the Last Year: Never true  Transportation  Needs: No Transportation Needs (03/23/2022)   PRAPARE - Administrator, Civil Service (Medical): No    Lack of Transportation (Non-Medical): No  Physical Activity: Not on file  Stress: Not on file  Social Connections: Not on file  Intimate Partner Violence: Not on file    FAMILY HISTORY: Family History  Problem Relation Age of Onset   Colon polyps Mother    Heart disease Mother    Diabetes Father    Heart disease Father    Breast cancer Maternal Aunt 71 - 69   Ovarian cancer Paternal Aunt    Bladder Cancer Paternal Uncle        paternal half-uncle   Breast cancer Cousin 54 - 39    ALLERGIES:  has No Known Allergies.  MEDICATIONS:  Current Outpatient Medications  Medication Sig Dispense Refill   ACCU-CHEK GUIDE test strip check sugar 3x daily In Vitro three times daily for 90 days     Accu-Chek Softclix Lancets lancets USE 1 TO CHECK GLUCOSE THREE TIMES DAILY     aspirin 81 MG tablet Take 81 mg by mouth daily.      benazepril-hydrochlorthiazide (LOTENSIN HCT) 20-25 MG tablet Take 1 tablet by mouth daily.     Blood Glucose Monitoring Suppl (ACCU-CHEK GUIDE) w/Device KIT check  glucose daily and as needed for 365 days     Cyanocobalamin (B-12) 500 MCG SUBL Place 1 tablet under the tongue daily.     docusate sodium (COLACE) 100 MG capsule Take 100 mg by mouth daily.     EUTHYROX 175 MCG tablet Take 175 mcg by mouth daily before breakfast.     metFORMIN (GLUCOPHAGE) 500 MG tablet 500 mg. 1 tablet with breakfast, 1 tablet with supper     metoprolol succinate (TOPROL XL) 50 MG 24 hr tablet Take 1 tablet (50 mg total) by mouth daily. Take with or immediately following a meal. 90 tablet 1   nystatin cream (MYCOSTATIN) 1 application Externally Twice a day as needed for 30 days     rosuvastatin (CRESTOR) 40 MG tablet Take 1 tablet (40 mg total) by mouth daily. 90 tablet 1   terbinafine (LAMISIL) 250 MG tablet      triamcinolone (KENALOG) 0.025 % cream 1 application Externally  Once a day as neeed     VITAMIN D3 1.25 MG (50000 UT) capsule Take 50,000 Units by mouth once a week.     No current facility-administered medications for this visit.   REVIEW OF SYSTEMS:   Constitutional: Denies fevers, chills or abnormal night sweats Eyes: Denies blurriness of vision, double vision or watery eyes Ears, nose, mouth, throat, and face: Denies mucositis or sore throat Respiratory: Denies cough, dyspnea or wheezes Cardiovascular: Denies palpitation, chest discomfort or lower extremity swelling Gastrointestinal:  Denies nausea, heartburn or change in bowel habits Skin: Denies abnormal skin rashes Lymphatics: Denies new lymphadenopathy or easy bruising Neurological:Denies numbness, tingling or new weaknesses Behavioral/Psych: Mood is stable, no new changes  Breast: Denies any palpable lumps or discharge All other systems were reviewed with the patient and are negative.  PHYSICAL EXAMINATION: ECOG PERFORMANCE STATUS: 0 - Asymptomatic  There were no vitals filed for this visit.  There were no vitals filed for this visit.  PE deferred in lieu of counseling  LABORATORY DATA:  I have reviewed the data as listed Lab Results  Component Value Date   WBC 8.8 03/23/2022   HGB 13.6 03/23/2022   HCT 40.5 03/23/2022   MCV 89.0 03/23/2022   PLT 182 03/23/2022   Lab Results  Component Value Date   NA 140 04/11/2022   K 3.8 04/11/2022   CL 104 04/11/2022   CO2 25 04/11/2022    RADIOGRAPHIC STUDIES: I have personally reviewed the radiological reports and agreed with the findings in the report.  ASSESSMENT AND PLAN:  No problem-specific Assessment & Plan notes found for this encounter.  Total time spent: 40 minutes including history, physical exam, review of records, counseling and coordination of care, breast MDC discussion. All questions were answered. The patient knows to call the clinic with any problems, questions or concerns.    Rachel Moulds, MD 07/25/22

## 2022-07-26 ENCOUNTER — Inpatient Hospital Stay: Payer: BC Managed Care – PPO | Attending: Hematology and Oncology | Admitting: Hematology and Oncology

## 2022-07-26 ENCOUNTER — Encounter: Payer: Self-pay | Admitting: Hematology and Oncology

## 2022-07-26 VITALS — BP 152/83 | HR 69 | Temp 97.8°F | Resp 18 | Ht 66.0 in | Wt 222.5 lb

## 2022-07-26 DIAGNOSIS — Z170 Estrogen receptor positive status [ER+]: Secondary | ICD-10-CM | POA: Diagnosis not present

## 2022-07-26 DIAGNOSIS — Z923 Personal history of irradiation: Secondary | ICD-10-CM | POA: Diagnosis not present

## 2022-07-26 DIAGNOSIS — C50412 Malignant neoplasm of upper-outer quadrant of left female breast: Secondary | ICD-10-CM | POA: Diagnosis not present

## 2022-07-26 DIAGNOSIS — I10 Essential (primary) hypertension: Secondary | ICD-10-CM | POA: Diagnosis not present

## 2022-07-26 DIAGNOSIS — E785 Hyperlipidemia, unspecified: Secondary | ICD-10-CM | POA: Diagnosis not present

## 2022-07-26 DIAGNOSIS — E669 Obesity, unspecified: Secondary | ICD-10-CM | POA: Insufficient documentation

## 2022-07-26 MED ORDER — ANASTROZOLE 1 MG PO TABS: 1.0000 mg | ORAL_TABLET | Freq: Every day | ORAL | 3 refills | Status: DC

## 2022-07-26 NOTE — Radiation Completion Notes (Addendum)
  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   Intent: Curative     ==========DELIVERED PLANS==========  First Treatment Date: 2022-06-06 - Last Treatment Date: 2022-07-21   Plan Name: CW_L_BO_BH Site: Chest Wall, Left Technique: 3D Mode: Photon Dose Per Fraction: 1.8 Gy Prescribed Dose (Delivered / Prescribed): 25.2 Gy / 25.2 Gy Prescribed Fxs (Delivered / Prescribed): 14 / 14   Plan Name: CW_PAB_SCV_BH Site: Chest Wall, Left Technique: 3D Mode: Photon Dose Per Fraction: 1.8 Gy Prescribed Dose (Delivered / Prescribed): 50.4 Gy / 50.4 Gy Prescribed Fxs (Delivered / Prescribed): 28 / 28   Plan Name: CW_L_Bst_BO Site: Chest Wall, Left Technique: Electron Mode: Electron Dose Per Fraction: 2 Gy Prescribed Dose (Delivered / Prescribed): 10 Gy / 10 Gy Prescribed Fxs (Delivered / Prescribed): 5 / 5   Plan Name: CW_L_BH Site: Chest Wall, Left Technique: 3D Mode: Photon Dose Per Fraction: 1.8 Gy Prescribed Dose (Delivered / Prescribed): 25.2 Gy / 25.2 Gy Prescribed Fxs (Delivered / Prescribed): 14 / 14     ==========ON TREATMENT VISIT DATES========== 2022-06-10, 2022-06-17, 2022-06-24, 2022-07-01, 2022-07-08, 2022-07-15, 2022-07-20     See weekly On Treatment Notes in Epic for details. The patient tolerated radiation. She developed fatigue and anticipated skin changes in the treatment field.   The patient will receive a call in about one month from the radiation oncology department. She will continue follow up with Dr. Al Pimple as well.      Osker Mason, PAC

## 2022-07-26 NOTE — Assessment & Plan Note (Addendum)
This is a very pleasant 67 yr old female patient with PMH significant for HTN, obesity, hyperlipidemia referred to breast MDC for recommendations.  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  We sent for Oncotype testing but we are waiting for the results.  If Oncotype suggests benefit from addition of chemotherapy, we will consider chemotherapy.  If there is no benefit from chemo, she will proceed with radiation followed by adjuvant antiestrogen therapy and CDK 4 6 inhibition. Oncotype of 10, no role for chemo She completed radiation on 07/21/2022 She will now start on anastrozole. She is not a candidate for Monarch E because of grade 2 tumor and tumor size less than 5 cms despite positive LN. she may be a candidate for adjuvant Ribociclib once FDA approved. We have once again discussed about the adverse effects of aromatase inhibitors including but not limited to hot flashes, vaginal dryness, arthralgias and bone density loss.  Last bone density in 2017 with normal bone density.  She states she cannot exercise because of bad joints.  Will repeat a bone density and address any recommendations.  She will return to clinic in 3 months for survivorship visit.  She was encouraged to call us sooner with any worsening questions or concerns

## 2022-08-29 ENCOUNTER — Other Ambulatory Visit: Payer: Self-pay

## 2022-08-29 DIAGNOSIS — Z79899 Other long term (current) drug therapy: Secondary | ICD-10-CM

## 2022-08-29 DIAGNOSIS — E785 Hyperlipidemia, unspecified: Secondary | ICD-10-CM

## 2022-08-29 DIAGNOSIS — I700 Atherosclerosis of aorta: Secondary | ICD-10-CM

## 2022-08-29 DIAGNOSIS — E119 Type 2 diabetes mellitus without complications: Secondary | ICD-10-CM

## 2022-08-29 MED ORDER — ROSUVASTATIN CALCIUM 40 MG PO TABS
40.0000 mg | ORAL_TABLET | Freq: Every day | ORAL | 2 refills | Status: AC
Start: 2022-08-29 — End: ?

## 2022-09-01 ENCOUNTER — Other Ambulatory Visit: Payer: Self-pay | Admitting: *Deleted

## 2022-09-01 DIAGNOSIS — Z79899 Other long term (current) drug therapy: Secondary | ICD-10-CM

## 2022-09-01 DIAGNOSIS — I10 Essential (primary) hypertension: Secondary | ICD-10-CM

## 2022-09-01 DIAGNOSIS — I700 Atherosclerosis of aorta: Secondary | ICD-10-CM

## 2022-09-01 DIAGNOSIS — E785 Hyperlipidemia, unspecified: Secondary | ICD-10-CM

## 2022-09-14 ENCOUNTER — Telehealth: Payer: Self-pay | Admitting: *Deleted

## 2022-09-14 NOTE — Telephone Encounter (Signed)
This RN spoke with pt per her call stating noted onset of diarrhea post starting the anastrozole.  Loose stools started approximately a week post starting in June 2024,  She was unsure of cause - eliminated added sugar substitutes and protein shakes.  Diarrhea continued with increase in severity- including "sudden onset"  No noted other new medications- nor dietary changes.  She denies any antibiotic therapy.  She has been using imodium with benefit but stated concern per above especially due to " it can be very unpredictable when it occurs so I am almost afraid to go out much"  Per above discussion- recommendation given for pt to hold the anastrozole at this time- she will call in approximately 1 week with update (of note she is going on vacation next week).

## 2022-09-26 ENCOUNTER — Ambulatory Visit
Admission: RE | Admit: 2022-09-26 | Discharge: 2022-09-26 | Disposition: A | Discharge status: Home / Self Care | Payer: BC Managed Care – PPO | Source: Ambulatory Visit | Attending: Hematology and Oncology | Admitting: Hematology and Oncology

## 2022-09-26 ENCOUNTER — Telehealth: Payer: Self-pay | Admitting: *Deleted

## 2022-09-26 DIAGNOSIS — E890 Postprocedural hypothyroidism: Secondary | ICD-10-CM | POA: Diagnosis not present

## 2022-09-26 DIAGNOSIS — C73 Malignant neoplasm of thyroid gland: Secondary | ICD-10-CM | POA: Diagnosis not present

## 2022-09-26 NOTE — Progress Notes (Signed)
  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   Symptoms of fatigue have improved since completing therapy.  Symptoms of skin changes have improved since completing therapy.  The patient was encouraged to avoid sun exposure in the area of prior treatment for up to one year following radiation with either sunscreen or by the style of clothing worn in the sun.  The patient has scheduled follow up on 09/26/2022 with her medical oncologist Dr. Al Pimple  for ongoing surveillance, and was encouraged to call if she develops concerns or questions regarding radiation.   This concludes the interaction.  Ruel Favors, LPN

## 2022-10-04 ENCOUNTER — Other Ambulatory Visit: Payer: Self-pay

## 2022-10-04 ENCOUNTER — Inpatient Hospital Stay: Payer: BC Managed Care – PPO | Attending: Hematology and Oncology | Admitting: Adult Health

## 2022-10-04 ENCOUNTER — Encounter: Payer: Self-pay | Admitting: Adult Health

## 2022-10-04 VITALS — BP 134/72 | HR 60 | Temp 97.9°F | Resp 18 | Ht 66.0 in | Wt 221.2 lb

## 2022-10-04 DIAGNOSIS — Z923 Personal history of irradiation: Secondary | ICD-10-CM | POA: Insufficient documentation

## 2022-10-04 DIAGNOSIS — Z79899 Other long term (current) drug therapy: Secondary | ICD-10-CM

## 2022-10-04 DIAGNOSIS — Z79811 Long term (current) use of aromatase inhibitors: Secondary | ICD-10-CM | POA: Diagnosis not present

## 2022-10-04 DIAGNOSIS — C50412 Malignant neoplasm of upper-outer quadrant of left female breast: Secondary | ICD-10-CM | POA: Diagnosis not present

## 2022-10-04 DIAGNOSIS — R002 Palpitations: Secondary | ICD-10-CM

## 2022-10-04 DIAGNOSIS — Z9012 Acquired absence of left breast and nipple: Secondary | ICD-10-CM | POA: Insufficient documentation

## 2022-10-04 DIAGNOSIS — Z170 Estrogen receptor positive status [ER+]: Secondary | ICD-10-CM | POA: Insufficient documentation

## 2022-10-04 DIAGNOSIS — Z13820 Encounter for screening for osteoporosis: Secondary | ICD-10-CM | POA: Diagnosis not present

## 2022-10-04 MED ORDER — METOPROLOL SUCCINATE ER 50 MG PO TB24
50.0000 mg | ORAL_TABLET | Freq: Every day | ORAL | 2 refills | Status: AC
Start: 2022-10-04 — End: ?

## 2022-10-04 MED ORDER — LETROZOLE 2.5 MG PO TABS
2.5000 mg | ORAL_TABLET | Freq: Every day | ORAL | 1 refills | Status: DC
Start: 2022-10-04 — End: 2022-11-28

## 2022-10-04 NOTE — Progress Notes (Signed)
[facility] Cancer Follow up:    Lauren Patience, FNP [address] [place] [address]   DIAGNOSIS:  Cancer Staging  Malignant neoplasm of upper-outer quadrant of left breast in female, estrogen receptor positive (HCC) Staging form: Breast, AJCC 8th Edition - Pathologic: Stage IA (pT1b, pN1a, cM0, G2, ER+, PR+, HER2-) - Signed by Rachel Moulds, MD on 05/06/2022 Histologic grading system: 3 grade system   SUMMARY OF ONCOLOGIC HISTORY: Oncology History  Malignant neoplasm of upper-outer quadrant of left breast in female, estrogen receptor positive (HCC)  03/04/2022 Mammogram   Screening mammogram showed calcs and possible distortion in the left breast. Diag mammogram showed suspicious calcs and non mass finding in the 2 0 clock location of the left breast warranting further tissue diagnosis. No left axillary adenopathy   03/10/2022 Breast US   Suspicious calcifications and non mass enhancement measuring approximately 4.9 cm in largest dimension.   03/23/2022 Cancer Staging   Staging form: Breast, AJCC 8th Edition - Pathologic: Stage IA (pT1b, pN1a, cM0, G2, ER+, PR+, HER2-) - Signed by Rachel Moulds, MD on 05/06/2022 Histologic grading system: 3 grade system   03/25/2022 Pathology Results   Pathology showed grade 2 IDC, ER positive, PR positive, her 2 neg, Ki 67 30%   03/31/2022 Genetic Testing   Negative. The Multi-Cancer + RNA Panel offered by [facility] includes sequencing and/or deletion/duplication analysis of the following 70 genes:  AIP*, ALK, APC*, ATM*, AXIN2*, BAP1*, BARD1*, BLM*, BMPR1A*, BRCA1*, BRCA2*, BRIP1*, CDC73*, CDH1*, CDK4, CDKN1B*, CDKN2A, CHEK2*, CTNNA1*, DICER1*, EPCAM (del/dup only), EGFR, FH*, FLCN*, GREM1 (promoter dup only), HOXB13, KIT, LZTR1, MAX*, MBD4, MEN1*, MET, MITF, MLH1*, MSH2*, MSH3*, MSH6*, MUTYH*, NF1*, NF2*, NTHL1*, PALB2*, PDGFRA, PMS2*, POLD1*, POLE*, POT1*, PRKAR1A*, PTCH1*, PTEN*, RAD51C*, RAD51D*, RB1*, RET, SDHA*  (sequencing only), SDHAF2*, SDHB*, SDHC*, SDHD*, SMAD4*, SMARCA4*, SMARCB1*, SMARCE1*, STK11*, SUFU*, TMEM127*, TP53*, TSC1*, TSC2*, VHL*. RNA analysis is performed for * genes.   04/14/2022 Surgery   Left breast mastectomy: IDC, grade 2, 0.8cm, 3/17 LN positive for macrometastases, margins negative.    05/09/2022 Oncotype testing   10/12%   06/06/2022 - 07/21/2022 Radiation Therapy   Plan Name: CW_L_BO_BH Site: Chest Wall, Left Technique: 3D Mode: Photon Dose Per Fraction: 1.8 Gy Prescribed Dose (Delivered / Prescribed): 25.2 Gy / 25.2 Gy Prescribed Fxs (Delivered / Prescribed): 14 / 14   Plan Name: CW_PAB_SCV_BH Site: Chest Wall, Left Technique: 3D Mode: Photon Dose Per Fraction: 1.8 Gy Prescribed Dose (Delivered / Prescribed): 50.4 Gy / 50.4 Gy Prescribed Fxs (Delivered / Prescribed): 28 / 28   Plan Name: CW_L_Bst_BO Site: Chest Wall, Left Technique: Electron Mode: Electron Dose Per Fraction: 2 Gy Prescribed Dose (Delivered / Prescribed): 10 Gy / 10 Gy Prescribed Fxs (Delivered / Prescribed): 5 / 5   Plan Name: CW_L_BH Site: Chest Wall, Left Technique: 3D Mode: Photon Dose Per Fraction: 1.8 Gy Prescribed Dose (Delivered / Prescribed): 25.2 Gy / 25.2 Gy Prescribed Fxs (Delivered / Prescribed): 14 / 14     CURRENT THERAPY: s/p radiation, to start letrozole  INTERVAL HISTORY: Lauren [name] 67 y.o. female returns for f/u as she had not tolerated the anastrozole particularly well.  She stopped taking it for a couple of weeks and is doing better.  She is healing from radiation therapy.  She is here to discuss next steps.    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   not current problem, no hematologist    SURGICAL HISTORY: Past Surgical History:  Procedure Laterality Date   ABDOMINAL HYSTERECTOMY  11-18-1998   partial   BREAST BIOPSY Left 03/15/2022   [place] LT BREAST BX W LOC DEV 1ST LESION IMG BX SPEC US GUIDE 03/15/2022 [facility]   BREAST BIOPSY Left 03/15/2022   [place] LT BREAST BX W LOC DEV EA ADD LESION IMG BX SPEC US GUIDE 03/15/2022 [facility]   CHOLECYSTECTOMY  08/02/2011   Procedure: LAPAROSCOPIC CHOLECYSTECTOMY WITH INTRAOPERATIVE CHOLANGIOGRAM;  Surgeon: Valarie Merino, MD;  Location: [facility];  Service: General;  Laterality: N/A;   LAPAROSCOPIC GASTRIC BANDING  05/19/2008   LUMBAR DISC SURGERY  03/21/1996   MASTECTOMY W/ SENTINEL NODE BIOPSY Left 04/14/2022   Procedure: LEFT MASTECTOMY WITH SENTINEL NODE BIOPSY;  Surgeon: Abigail Miyamoto, MD;  Location: [facility];  Service: General;  Laterality: Left;   THYROIDECTOMY N/A 09/08/2015   Procedure: TOTAL THYROIDECTOMY;  Surgeon: Darnell Level, MD;  Location: [facility];  Service: General;  Laterality: N/A;   TONSILLECTOMY      SOCIAL HISTORY: Social History   Socioeconomic History   Marital status: Married    Spouse name: Not on file   Number of children: Not on file   Years of education: Not on file   Highest education level: Not on file  Occupational History   Not on file  Tobacco Use   Smoking status: Never   Smokeless tobacco: Never  Substance and Sexual Activity   Alcohol use: No   Drug use: No   Sexual activity: Yes  Other Topics Concern   Not on file  Social History  Narrative   Not on file   Social Determinants of Health   Financial Resource Strain: Low Risk  (03/23/2022)   Overall Financial Resource Strain (CARDIA)    Difficulty of Paying Living Expenses: Not hard at all  Food Insecurity: No Food Insecurity (03/23/2022)   Hunger Vital Sign    Worried About Running Out of Food in the Last Year: Never true    Ran Out of Food in the Last Year: Never true  Transportation Needs: No Transportation Needs (03/23/2022)   PRAPARE - Administrator, Civil Service (Medical): No    Lack of Transportation (Non-Medical): No  Physical Activity: Not on file  Stress: Not on file  Social Connections: Not on file  Intimate Partner Violence: Not on file    FAMILY HISTORY: Family History  Problem Relation Age of Onset   Colon polyps Mother    Heart disease Mother    Diabetes Father    Heart disease Father    Breast cancer Maternal Aunt 28 - 69   Ovarian cancer Paternal Aunt    Bladder Cancer Paternal Uncle        paternal half-uncle   Breast cancer Cousin 33 - 63    Review of Systems  Constitutional:  Negative for appetite change, chills, fatigue, fever and unexpected weight change.  HENT:   Negative for hearing loss, lump/mass and trouble swallowing.   Eyes:  Negative for eye problems and icterus.  Respiratory:  Negative for chest tightness, cough and shortness of breath.   Cardiovascular:  Negative for chest pain, leg swelling and palpitations.  Gastrointestinal:  Negative for abdominal distention, abdominal pain, constipation, diarrhea, nausea and vomiting.  Endocrine: Negative for hot flashes.  Genitourinary:  Negative for difficulty urinating.   Musculoskeletal:  Negative for arthralgias.  Skin:  Negative for itching and rash.  Neurological:  Negative for dizziness, extremity weakness, headaches and numbness.  Hematological:  Negative for adenopathy. Does not bruise/bleed easily.  Psychiatric/Behavioral:  Negative for depression. The patient  is not nervous/anxious.       PHYSICAL EXAMINATION    Vitals:   10/04/22 1551  BP: 134/72  Pulse: 60  Resp: 18  Temp: 97.9 F (36.6 C)  SpO2: 98%    Physical Exam Constitutional:      General: She is not in acute distress.    Appearance: Normal appearance. She is not toxic-appearing.  HENT:     Head: Normocephalic and atraumatic.     Mouth/Throat:     Mouth: Mucous membranes are moist.     Pharynx: Oropharynx is clear. No oropharyngeal exudate or posterior oropharyngeal erythema.  Eyes:     General: No scleral icterus. Cardiovascular:     Rate and Rhythm: Normal rate and regular rhythm.     Pulses: Normal pulses.     Heart sounds: Normal heart sounds.  Pulmonary:     Effort: Pulmonary effort is normal.     Breath sounds: Normal breath sounds.  Abdominal:     General: Abdomen is flat. Bowel sounds are normal. There is no distension.     Palpations: Abdomen is soft.     Tenderness: There is no abdominal tenderness.  Musculoskeletal:        General: No swelling.     Cervical back: Neck supple.  Lymphadenopathy:     Cervical: No cervical adenopathy.  Skin:    General: Skin is warm and dry.     Findings: No rash.  Neurological:     General: No focal deficit present.     Mental Status: She is alert.  Psychiatric:        Mood and Affect: Mood normal.        Behavior: Behavior normal.     ASSESSMENT and THERAPY PLAN:   No problem-specific Assessment & Plan notes found for this encounter.   All questions were answered. The patient knows to call the clinic with any problems, questions or concerns. We can certainly see the patient much sooner if necessary.  Total encounter time:30 minutes*in face-to-face visit time, chart review, lab review, care coordination, order entry, and documentation of the encounter time.    Lillard Anes, NP 10/09/22 8:47 PM Medical Oncology and Hematology [facility] [address] [place], [place] [address] Tel.  [phone]    Fax. [phone]  *Total Encounter Time as defined by the Centers for Medicare and Medicaid Services includes, in addition to the face-to-face time of a patient visit (documented in the note above) non-face-to-face time: obtaining and reviewing outside history, ordering and reviewing medications, tests or procedures, care coordination (communications with other health care professionals or caregivers) and documentation in the medical record.

## 2022-10-05 ENCOUNTER — Other Ambulatory Visit: Payer: Self-pay | Admitting: Internal Medicine

## 2022-10-05 DIAGNOSIS — R9389 Abnormal findings on diagnostic imaging of other specified body structures: Secondary | ICD-10-CM | POA: Diagnosis not present

## 2022-10-05 DIAGNOSIS — C73 Malignant neoplasm of thyroid gland: Secondary | ICD-10-CM | POA: Diagnosis not present

## 2022-10-05 DIAGNOSIS — Z85850 Personal history of malignant neoplasm of thyroid: Secondary | ICD-10-CM

## 2022-10-05 DIAGNOSIS — E890 Postprocedural hypothyroidism: Secondary | ICD-10-CM | POA: Diagnosis not present

## 2022-10-06 ENCOUNTER — Ambulatory Visit
Admission: RE | Admit: 2022-10-06 | Discharge: 2022-10-06 | Disposition: A | Discharge status: Home / Self Care | Payer: BC Managed Care – PPO | Source: Ambulatory Visit | Attending: Internal Medicine | Admitting: Internal Medicine

## 2022-10-06 DIAGNOSIS — C73 Malignant neoplasm of thyroid gland: Secondary | ICD-10-CM | POA: Diagnosis not present

## 2022-10-06 DIAGNOSIS — Z85850 Personal history of malignant neoplasm of thyroid: Secondary | ICD-10-CM

## 2022-10-09 NOTE — Assessment & Plan Note (Addendum)
Lauren [name] is a 67 year old woman with stage Ia left breast invasive ductal carcinoma, ER/PR positive diagnosed in January 2024 status postmastectomy, adjuvant radiation, and antiestrogen therapy with letrozole beginning today.   Left-sided breast cancer with 3 positive lymph nodes: She is status postmastectomy and radiation and is healing well.  I prescribed letrozole for her to take daily.  We reviewed the risks and benefits in detail. Bone health: We discussed bone health and I placed orders for bone density testing today.  We reviewed the importance of calcium, vitamin D, and weightbearing exercises. Next steps: She will return in 3 months for survivorship care plan visit.  I reviewed with her what this means in detail.  She verbalized understanding.  I sent Dr. Al Pimple a message about additional criteria were added to the CDK 4 6 inhibitor that included 3 lymph nodes being positive.  [name] will return in 3 months and knows to call for any questions or concerns that may arise between now and her next appointment with [place].

## 2022-10-19 ENCOUNTER — Telehealth: Payer: Self-pay

## 2022-10-19 ENCOUNTER — Ambulatory Visit (HOSPITAL_BASED_OUTPATIENT_CLINIC_OR_DEPARTMENT_OTHER)
Admission: RE | Admit: 2022-10-19 | Discharge: 2022-10-19 | Disposition: A | Discharge status: Home / Self Care | Payer: BC Managed Care – PPO | Source: Ambulatory Visit | Attending: Adult Health | Admitting: Adult Health

## 2022-10-19 DIAGNOSIS — Z79811 Long term (current) use of aromatase inhibitors: Secondary | ICD-10-CM | POA: Diagnosis not present

## 2022-10-19 DIAGNOSIS — Z13820 Encounter for screening for osteoporosis: Secondary | ICD-10-CM | POA: Diagnosis not present

## 2022-10-19 DIAGNOSIS — Z780 Asymptomatic menopausal state: Secondary | ICD-10-CM | POA: Diagnosis not present

## 2022-10-19 NOTE — Telephone Encounter (Signed)
Called and left a message per Lillard Anes, NP, bone density is normal. Ask her to call the office for questions.

## 2022-10-28 DIAGNOSIS — E119 Type 2 diabetes mellitus without complications: Secondary | ICD-10-CM | POA: Diagnosis not present

## 2022-10-28 DIAGNOSIS — E785 Hyperlipidemia, unspecified: Secondary | ICD-10-CM | POA: Diagnosis not present

## 2022-10-28 DIAGNOSIS — Z23 Encounter for immunization: Secondary | ICD-10-CM | POA: Diagnosis not present

## 2022-10-28 DIAGNOSIS — E559 Vitamin D deficiency, unspecified: Secondary | ICD-10-CM | POA: Diagnosis not present

## 2022-10-28 DIAGNOSIS — Z0000 Encounter for general adult medical examination without abnormal findings: Secondary | ICD-10-CM | POA: Diagnosis not present

## 2022-10-28 DIAGNOSIS — E538 Deficiency of other specified B group vitamins: Secondary | ICD-10-CM | POA: Diagnosis not present

## 2022-10-28 DIAGNOSIS — I10 Essential (primary) hypertension: Secondary | ICD-10-CM | POA: Diagnosis not present

## 2022-11-08 ENCOUNTER — Encounter: Payer: Self-pay | Admitting: Adult Health

## 2022-11-08 ENCOUNTER — Inpatient Hospital Stay: Payer: BC Managed Care – PPO | Attending: Hematology and Oncology | Admitting: Adult Health

## 2022-11-08 VITALS — BP 129/54 | HR 72 | Temp 97.3°F | Resp 18 | Wt 219.3 lb

## 2022-11-08 DIAGNOSIS — Z79811 Long term (current) use of aromatase inhibitors: Secondary | ICD-10-CM | POA: Insufficient documentation

## 2022-11-08 DIAGNOSIS — K219 Gastro-esophageal reflux disease without esophagitis: Secondary | ICD-10-CM | POA: Insufficient documentation

## 2022-11-08 DIAGNOSIS — E785 Hyperlipidemia, unspecified: Secondary | ICD-10-CM | POA: Insufficient documentation

## 2022-11-08 DIAGNOSIS — C50412 Malignant neoplasm of upper-outer quadrant of left female breast: Secondary | ICD-10-CM | POA: Insufficient documentation

## 2022-11-08 DIAGNOSIS — Z170 Estrogen receptor positive status [ER+]: Secondary | ICD-10-CM | POA: Insufficient documentation

## 2022-11-08 DIAGNOSIS — Z86010 Personal history of colonic polyps: Secondary | ICD-10-CM | POA: Insufficient documentation

## 2022-11-08 DIAGNOSIS — Z85850 Personal history of malignant neoplasm of thyroid: Secondary | ICD-10-CM | POA: Insufficient documentation

## 2022-11-08 DIAGNOSIS — R7303 Prediabetes: Secondary | ICD-10-CM | POA: Insufficient documentation

## 2022-11-08 DIAGNOSIS — Z8616 Personal history of COVID-19: Secondary | ICD-10-CM | POA: Insufficient documentation

## 2022-11-08 DIAGNOSIS — R413 Other amnesia: Secondary | ICD-10-CM | POA: Insufficient documentation

## 2022-11-08 DIAGNOSIS — E890 Postprocedural hypothyroidism: Secondary | ICD-10-CM | POA: Insufficient documentation

## 2022-11-08 DIAGNOSIS — I10 Essential (primary) hypertension: Secondary | ICD-10-CM | POA: Insufficient documentation

## 2022-11-08 DIAGNOSIS — R197 Diarrhea, unspecified: Secondary | ICD-10-CM | POA: Diagnosis not present

## 2022-11-08 DIAGNOSIS — Z6841 Body Mass Index (BMI) 40.0 and over, adult: Secondary | ICD-10-CM | POA: Insufficient documentation

## 2022-11-08 DIAGNOSIS — I700 Atherosclerosis of aorta: Secondary | ICD-10-CM | POA: Insufficient documentation

## 2022-11-08 DIAGNOSIS — E539 Vitamin B deficiency, unspecified: Secondary | ICD-10-CM | POA: Insufficient documentation

## 2022-11-08 DIAGNOSIS — K8020 Calculus of gallbladder without cholecystitis without obstruction: Secondary | ICD-10-CM | POA: Insufficient documentation

## 2022-11-08 DIAGNOSIS — Z862 Personal history of diseases of the blood and blood-forming organs and certain disorders involving the immune mechanism: Secondary | ICD-10-CM | POA: Insufficient documentation

## 2022-11-08 DIAGNOSIS — E119 Type 2 diabetes mellitus without complications: Secondary | ICD-10-CM | POA: Insufficient documentation

## 2022-11-08 NOTE — Progress Notes (Signed)
SURVIVORSHIP VISIT:  BRIEF ONCOLOGIC HISTORY:  Oncology History  Malignant neoplasm of upper-outer quadrant of left breast in female, estrogen receptor positive (HCC)  03/04/2022 Mammogram   Screening mammogram showed calcs and possible distortion in the left breast. Diag mammogram showed suspicious calcs and non mass finding in the 2 0 clock location of the left breast warranting further tissue diagnosis. No left axillary adenopathy   03/10/2022 Breast US   Suspicious calcifications and non mass enhancement measuring approximately 4.9 cm in largest dimension.   03/23/2022 Cancer Staging   Staging form: Breast, AJCC 8th Edition - Pathologic: Stage IA (pT1b, pN1a, cM0, G2, ER+, PR+, HER2-) - Signed by Rachel Moulds, MD on 05/06/2022 Histologic grading system: 3 grade system   03/25/2022 Pathology Results   Pathology showed grade 2 IDC, ER positive, PR positive, her 2 neg, Ki 67 30%   03/31/2022 Genetic Testing   Negative. The Multi-Cancer + RNA Panel offered by [facility] includes sequencing and/or deletion/duplication analysis of the following 70 genes:  AIP*, ALK, APC*, ATM*, AXIN2*, BAP1*, BARD1*, BLM*, BMPR1A*, BRCA1*, BRCA2*, BRIP1*, CDC73*, CDH1*, CDK4, CDKN1B*, CDKN2A, CHEK2*, CTNNA1*, DICER1*, EPCAM (del/dup only), EGFR, FH*, FLCN*, GREM1 (promoter dup only), HOXB13, KIT, LZTR1, MAX*, MBD4, MEN1*, MET, MITF, MLH1*, MSH2*, MSH3*, MSH6*, MUTYH*, NF1*, NF2*, NTHL1*, PALB2*, PDGFRA, PMS2*, POLD1*, POLE*, POT1*, PRKAR1A*, PTCH1*, PTEN*, RAD51C*, RAD51D*, RB1*, RET, SDHA* (sequencing only), SDHAF2*, SDHB*, SDHC*, SDHD*, SMAD4*, SMARCA4*, SMARCB1*, SMARCE1*, STK11*, SUFU*, TMEM127*, TP53*, TSC1*, TSC2*, VHL*. RNA analysis is performed for * genes.   04/14/2022 Surgery   Left breast mastectomy: IDC, grade 2, 0.8cm, 3/17 LN positive for macrometastases, margins negative.    05/09/2022 Oncotype testing   10/12%   06/06/2022 - 07/21/2022 Radiation Therapy   Plan Name: CW_L_BO_BH Site: Chest Wall,  Left Technique: 3D Mode: Photon Dose Per Fraction: 1.8 Gy Prescribed Dose (Delivered / Prescribed): 25.2 Gy / 25.2 Gy Prescribed Fxs (Delivered / Prescribed): 14 / 14   Plan Name: CW_PAB_SCV_BH Site: Chest Wall, Left Technique: 3D Mode: Photon Dose Per Fraction: 1.8 Gy Prescribed Dose (Delivered / Prescribed): 50.4 Gy / 50.4 Gy Prescribed Fxs (Delivered / Prescribed): 28 / 28   Plan Name: CW_L_Bst_BO Site: Chest Wall, Left Technique: Electron Mode: Electron Dose Per Fraction: 2 Gy Prescribed Dose (Delivered / Prescribed): 10 Gy / 10 Gy Prescribed Fxs (Delivered / Prescribed): 5 / 5   Plan Name: CW_L_BH Site: Chest Wall, Left Technique: 3D Mode: Photon Dose Per Fraction: 1.8 Gy Prescribed Dose (Delivered / Prescribed): 25.2 Gy / 25.2 Gy Prescribed Fxs (Delivered / Prescribed): 14 / 14   10/04/2022 -  Anti-estrogen oral therapy   Letrozole     INTERVAL HISTORY:  Lauren [name] to review her survivorship care plan detailing her treatment course for breast cancer, as well as monitoring long-term side effects of that treatment, education regarding health maintenance, screening, and overall wellness and health promotion.     Overall, Lauren [name] reports feeling quite well.  I saw her at the end of August and she was unable to tolerate anastrozole due to diarrhea however since changing to letrozole in August she has been doing quite well.  She denies any significant issues with the letrozole.  She is taking this daily.  REVIEW OF SYSTEMS:  Review of Systems  Constitutional:  Negative for appetite change, chills, fatigue, fever and unexpected weight change.  HENT:   Negative for hearing loss, lump/mass and trouble swallowing.   Eyes:  Negative for eye problems and icterus.  Respiratory:  Negative for  chest tightness, cough and shortness of breath.   Cardiovascular:  Negative for chest pain, leg swelling and palpitations.  Gastrointestinal:  Negative for abdominal distention,  abdominal pain, constipation, diarrhea, nausea and vomiting.  Endocrine: Negative for hot flashes.  Genitourinary:  Negative for difficulty urinating.   Musculoskeletal:  Negative for arthralgias.  Skin:  Negative for itching and rash.  Neurological:  Negative for dizziness, extremity weakness, headaches and numbness.  Hematological:  Negative for adenopathy. Does not bruise/bleed easily.  Psychiatric/Behavioral:  Negative for depression. The patient is not nervous/anxious.   Breast: Denies any new nodularity, masses, tenderness, nipple changes, or nipple discharge.       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   not current problem, no hematologist   Past Surgical History:  Procedure Laterality Date   ABDOMINAL HYSTERECTOMY  11-18-1998   partial   BREAST BIOPSY Left 03/15/2022   [place] LT BREAST BX W LOC DEV 1ST LESION IMG BX SPEC US GUIDE 03/15/2022 [facility]   BREAST BIOPSY Left 03/15/2022   [place] LT BREAST BX W LOC DEV EA ADD LESION IMG BX SPEC US GUIDE 03/15/2022 [facility]   CHOLECYSTECTOMY  08/02/2011   Procedure: LAPAROSCOPIC CHOLECYSTECTOMY WITH INTRAOPERATIVE CHOLANGIOGRAM;  Surgeon: Valarie Merino, MD;  Location: [facility];  Service: General;  Laterality: N/A;   LAPAROSCOPIC GASTRIC BANDING  05/19/2008   LUMBAR DISC SURGERY  03/21/1996   MASTECTOMY W/ SENTINEL NODE BIOPSY Left 04/14/2022   Procedure: LEFT MASTECTOMY WITH SENTINEL NODE BIOPSY;  Surgeon: Abigail Miyamoto, MD;  Location:  SURGERY CENTER;  Service: General;  Laterality: Left;   THYROIDECTOMY N/A 09/08/2015   Procedure: TOTAL THYROIDECTOMY;  Surgeon: Darnell Level, MD;  Location: [facility];  Service: General;  Laterality: N/A;   TONSILLECTOMY       ALLERGIES:  No Known Allergies   CURRENT MEDICATIONS:   Outpatient Encounter Medications as of 11/08/2022  Medication Sig   ACCU-CHEK GUIDE test strip check sugar 3x daily In Vitro three times daily for 90 days   Accu-Chek Softclix Lancets lancets USE 1 TO CHECK GLUCOSE THREE TIMES DAILY   aspirin 81 MG tablet Take 81 mg by mouth daily.    benazepril-hydrochlorthiazide (LOTENSIN HCT) 20-25 MG tablet Take 1 tablet by mouth daily.   Blood Glucose Monitoring Suppl (ACCU-CHEK GUIDE) w/Device KIT check glucose daily and as needed for 365 days   Cyanocobalamin (B-12) 500 MCG SUBL Place 1 tablet under the tongue daily.   docusate sodium (COLACE) 100 MG capsule Take 100 mg by mouth daily.   EUTHYROX 175 MCG tablet Take 175 mcg by mouth daily before breakfast.   letrozole (FEMARA) 2.5 MG tablet Take 1 tablet (2.5 mg total) by mouth daily.   metFORMIN (GLUCOPHAGE) 500 MG tablet 500 mg. 1 tablet with breakfast, 1 tablet with supper   metoprolol succinate (TOPROL XL) 50 MG 24 hr tablet Take 1 tablet (50 mg total) by mouth daily. Take with or immediately following a meal.   nystatin cream (MYCOSTATIN) 1 application Externally Twice a day as needed for 30 days   rosuvastatin (CRESTOR) 40 MG tablet Take 1 tablet (40 mg total) by mouth daily.   terbinafine (LAMISIL) 250 MG tablet    triamcinolone (KENALOG) 0.025 % cream 1 application Externally Once a day as neeed   VITAMIN  D3 1.25 MG (50000 UT) capsule Take 50,000 Units by mouth once a week.   No facility-administered encounter medications on file as of 11/08/2022.     ONCOLOGIC FAMILY HISTORY:  Family History  Problem Relation Age of Onset   Colon polyps Mother    Heart disease Mother    Diabetes Father    Heart disease Father    Breast cancer Maternal Aunt 71 - 69   Ovarian cancer Paternal Aunt    Bladder Cancer Paternal Uncle        paternal half-uncle   Breast cancer Cousin 42 - 48     SOCIAL HISTORY:  Social History   Socioeconomic History   Marital status: Married    Spouse name: Not on  file   Number of children: Not on file   Years of education: Not on file   Highest education level: Not on file  Occupational History   Not on file  Tobacco Use   Smoking status: Never   Smokeless tobacco: Never  Substance and Sexual Activity   Alcohol use: No   Drug use: No   Sexual activity: Yes  Other Topics Concern   Not on file  Social History Narrative   Not on file   Social Determinants of Health   Financial Resource Strain: Low Risk  (03/23/2022)   Overall Financial Resource Strain (CARDIA)    Difficulty of Paying Living Expenses: Not hard at all  Food Insecurity: No Food Insecurity (03/23/2022)   Hunger Vital Sign    Worried About Running Out of Food in the Last Year: Never true    Ran Out of Food in the Last Year: Never true  Transportation Needs: No Transportation Needs (03/23/2022)   PRAPARE - Administrator, Civil Service (Medical): No    Lack of Transportation (Non-Medical): No  Physical Activity: Not on file  Stress: Not on file  Social Connections: Not on file  Intimate Partner Violence: Not on file     OBSERVATIONS/OBJECTIVE:  BP (!) 129/54 (BP Location: Right Arm, Patient Position: Sitting)   Pulse 72   Temp (!) 97.3 F (36.3 C) (Temporal)   Resp 18   Wt 219 lb 5 oz (99.5 kg)   SpO2 99%   BMI 35.40 kg/m  GENERAL: Patient is a well appearing female in no acute distress HEENT:  Sclerae anicteric.  Oropharynx clear and moist. No ulcerations or evidence of oropharyngeal candidiasis. Neck is supple.  NODES:  No cervical, supraclavicular, or axillary lymphadenopathy palpated.  BREAST EXAM:  left breast s/p mastectomy and radiation, no sign of local recurrence, right bresat benign LUNGS:  Clear to auscultation bilaterally.  No wheezes or rhonchi. HEART:  Regular rate and rhythm. No murmur appreciated. ABDOMEN:  Soft, nontender.  Positive, normoactive bowel sounds. No organomegaly palpated. MSK:  No focal spinal tenderness to palpation. Full range  of motion bilaterally in the upper extremities. EXTREMITIES:  No peripheral edema.   SKIN:  Clear with no obvious rashes or skin changes. No nail dyscrasia. NEURO:  Nonfocal. Well oriented.  Appropriate affect.   LABORATORY DATA:  None for this visit.  DIAGNOSTIC IMAGING:  None for this visit.   ASSESSMENT AND PLAN:  Ms.. [name] is a pleasant 67 y.o. female with Stage IA left breast invasive ductal carcinoma, ER+/PR+/HER2-, diagnosed in 02/2022, treated with lumpectomy, adjuvant radiation therapy, and anti-estrogen therapy with Letrozole beginning in 09/2022.  She presents to the Survivorship Clinic for our initial meeting and routine follow-up post-completion of  treatment for breast cancer.    1. Stage IA left breast cancer:  Ms. Sabet is continuing to recover from definitive treatment for breast cancer. She had LN positive at surgery, referring to our CPP per Dr. Al Pimple to discuss the potential for CDK 4 6 inhibitors.  She will continue her anti-estrogen therapy with Letrozole. Thus far, she is tolerating the Letrozole well, with minimal side effects. She is recommended to continue annual right breast screening mammograms.     Today, a comprehensive survivorship care plan and treatment summary was reviewed with the patient today detailing her breast cancer diagnosis, treatment course, potential late/long-term effects of treatment, appropriate follow-up care with recommendations for the future, and patient education resources.  A copy of this summary, along with a letter will be sent to the patient's primary care provider via mail/fax/In Basket message after today's visit.    2. Bone health:  Given Ms. Poch's age/history of breast cancer and her current treatment regimen including anti-estrogen therapy with Letrozole, she is at risk for bone demineralization.  Her last DEXA scan was 10/19/2022 and was normal.  Repeat is recommended to occur in 10/2024.  She was given education on specific  activities to promote bone health.  3. Cancer screening:  Due to Ms. Siple's history and her age, she should receive screening for skin cancers, colon cancer, and gynecologic cancers.  The information and recommendations are listed on the patient's comprehensive care plan/treatment summary and were reviewed in detail with the patient.    4. Health maintenance and wellness promotion: Ms. Boast was encouraged to consume 5-7 servings of fruits and vegetables per day. We reviewed the "Nutrition Rainbow" handout.  She was also encouraged to engage in moderate to vigorous exercise for 30 minutes per day most days of the week.  She was instructed to limit her alcohol consumption and continue to abstain from tobacco use.     5. Support services/counseling: It is not uncommon for this period of the patient's cancer care trajectory to be one of many emotions and stressors.   She was given information regarding our available services and encouraged to contact me with any questions or for help enrolling in any of our support group/programs.    Follow up instructions:    -Return to cancer center in 2-4 weeks for discussion with Jonny Ruiz about any additional CDK 4 6 inhibitor therapies -Mammogram due in 02/2023 -Bone density testing due in September 2026 -Considering Signatera testing for minimal residual disease monitoring. -She is welcome to return back to the Survivorship Clinic at any time; no additional follow-up needed at this time.  -Consider referral back to survivorship as a long-term survivor for continued surveillance  The patient was provided an opportunity to ask questions and all were answered. The patient agreed with the plan and demonstrated an understanding of the instructions.   Total encounter time:45 minutes*in face-to-face visit time, chart review, lab review, care coordination, order entry, and documentation of the encounter time.    Lillard Anes, NP 11/08/22 8:54 AM Medical  Oncology and Hematology [facility] [address] [place], [place] [address] Tel. [phone]    Fax. [phone]  *Total Encounter Time as defined by the Centers for Medicare and Medicaid Services includes, in addition to the face-to-face time of a patient visit (documented in the note above) non-face-to-face time: obtaining and reviewing outside history, ordering and reviewing medications, tests or procedures, care coordination (communications with other health care professionals or caregivers) and documentation in the  medical record.

## 2022-11-17 ENCOUNTER — Ambulatory Visit: Payer: BC Managed Care – PPO | Admitting: Podiatry

## 2022-11-17 ENCOUNTER — Ambulatory Visit (INDEPENDENT_AMBULATORY_CARE_PROVIDER_SITE_OTHER): Payer: BC Managed Care – PPO

## 2022-11-17 ENCOUNTER — Encounter: Payer: Self-pay | Admitting: Podiatry

## 2022-11-17 ENCOUNTER — Other Ambulatory Visit: Payer: Self-pay

## 2022-11-17 ENCOUNTER — Telehealth: Payer: Self-pay | Admitting: Pharmacy Technician

## 2022-11-17 ENCOUNTER — Inpatient Hospital Stay: Payer: BC Managed Care – PPO | Attending: Hematology and Oncology | Admitting: Pharmacist

## 2022-11-17 ENCOUNTER — Other Ambulatory Visit: Payer: Self-pay | Admitting: Pharmacist

## 2022-11-17 ENCOUNTER — Other Ambulatory Visit (HOSPITAL_COMMUNITY): Payer: Self-pay

## 2022-11-17 VITALS — BP 155/77 | HR 80 | Temp 99.4°F | Resp 18

## 2022-11-17 DIAGNOSIS — E785 Hyperlipidemia, unspecified: Secondary | ICD-10-CM | POA: Diagnosis not present

## 2022-11-17 DIAGNOSIS — Z9049 Acquired absence of other specified parts of digestive tract: Secondary | ICD-10-CM | POA: Diagnosis not present

## 2022-11-17 DIAGNOSIS — M2142 Flat foot [pes planus] (acquired), left foot: Secondary | ICD-10-CM | POA: Diagnosis not present

## 2022-11-17 DIAGNOSIS — Z8052 Family history of malignant neoplasm of bladder: Secondary | ICD-10-CM | POA: Diagnosis not present

## 2022-11-17 DIAGNOSIS — M79605 Pain in left leg: Secondary | ICD-10-CM | POA: Diagnosis not present

## 2022-11-17 DIAGNOSIS — Z8041 Family history of malignant neoplasm of ovary: Secondary | ICD-10-CM | POA: Insufficient documentation

## 2022-11-17 DIAGNOSIS — R609 Edema, unspecified: Secondary | ICD-10-CM | POA: Insufficient documentation

## 2022-11-17 DIAGNOSIS — Z8249 Family history of ischemic heart disease and other diseases of the circulatory system: Secondary | ICD-10-CM | POA: Diagnosis not present

## 2022-11-17 DIAGNOSIS — Z170 Estrogen receptor positive status [ER+]: Secondary | ICD-10-CM | POA: Insufficient documentation

## 2022-11-17 DIAGNOSIS — R197 Diarrhea, unspecified: Secondary | ICD-10-CM | POA: Insufficient documentation

## 2022-11-17 DIAGNOSIS — Z79811 Long term (current) use of aromatase inhibitors: Secondary | ICD-10-CM | POA: Insufficient documentation

## 2022-11-17 DIAGNOSIS — Z1721 Progesterone receptor positive status: Secondary | ICD-10-CM | POA: Insufficient documentation

## 2022-11-17 DIAGNOSIS — Z803 Family history of malignant neoplasm of breast: Secondary | ICD-10-CM | POA: Insufficient documentation

## 2022-11-17 DIAGNOSIS — E119 Type 2 diabetes mellitus without complications: Secondary | ICD-10-CM | POA: Diagnosis not present

## 2022-11-17 DIAGNOSIS — L600 Ingrowing nail: Secondary | ICD-10-CM

## 2022-11-17 DIAGNOSIS — M25572 Pain in left ankle and joints of left foot: Secondary | ICD-10-CM | POA: Diagnosis not present

## 2022-11-17 DIAGNOSIS — Z833 Family history of diabetes mellitus: Secondary | ICD-10-CM | POA: Insufficient documentation

## 2022-11-17 DIAGNOSIS — Z9884 Bariatric surgery status: Secondary | ICD-10-CM | POA: Diagnosis not present

## 2022-11-17 DIAGNOSIS — Z79899 Other long term (current) drug therapy: Secondary | ICD-10-CM | POA: Diagnosis not present

## 2022-11-17 DIAGNOSIS — C50412 Malignant neoplasm of upper-outer quadrant of left female breast: Secondary | ICD-10-CM | POA: Insufficient documentation

## 2022-11-17 DIAGNOSIS — L84 Corns and callosities: Secondary | ICD-10-CM

## 2022-11-17 DIAGNOSIS — E1140 Type 2 diabetes mellitus with diabetic neuropathy, unspecified: Secondary | ICD-10-CM

## 2022-11-17 DIAGNOSIS — Z90710 Acquired absence of both cervix and uterus: Secondary | ICD-10-CM | POA: Diagnosis not present

## 2022-11-17 DIAGNOSIS — M2141 Flat foot [pes planus] (acquired), right foot: Secondary | ICD-10-CM | POA: Diagnosis not present

## 2022-11-17 DIAGNOSIS — Z83719 Family history of colon polyps, unspecified: Secondary | ICD-10-CM | POA: Insufficient documentation

## 2022-11-17 MED ORDER — ABEMACICLIB 100 MG PO TABS: 100.0000 mg | ORAL_TABLET | Freq: Two times a day (BID) | ORAL | 0 refills | Status: DC

## 2022-11-17 NOTE — Progress Notes (Signed)
[facility]       Telephone: [phone]?Fax: [phone]   Oncology Clinical Pharmacist Practitioner Initial Assessment  Lauren [name] is a 67 y.o. female with a diagnosis of breast cancer. They were contacted today via in-person visit.  Indication/Regimen Abemaciclib (Verzenio) is being used appropriately for treatment of breast cancer by Dr. Burnice Logan [name].      Wt Readings from Last 1 Encounters:  11/08/22 219 lb 5 oz (99.5 kg)    Estimated body surface area is 2.15 meters squared as calculated from the following:   Height as of 10/04/22: 5\' 6"  (1.676 m).   Weight as of 11/08/22: 219 lb 5 oz (99.5 kg).  The dosing regimen is 100 mg by mouth every 12 hours on days 1 to 28 of a 28-day cycle. This is being given  in combination with letrozole which was started on 10/04/22 . It is planned to continue until two years in the adjuvant setting per the monarchE trial data. Lauren [name] was seen today by clinical pharmacy after being referred by Dr. [name] to discuss potentially starting abemaciclib in the adjuvant setting.  Patient does have node positive disease and has had a mastectomy and radiation.  She was initially started on anastrozole but did not tolerate it (diarrhea).  She is now on letrozole which was started on 10/04/22.  We did discuss possible side effects of abemaciclib which are listed below.  After careful consideration, Lauren [name] is interested in starting abemaciclib in the adjuvant setting.  Dr. [name] is aware and prefers to start the patient at a reduced dose of 100 mg every 12 hours.  This prescription has been sent to the [facility] outpatient pharmacy.  We have advised Lauren [name] to contact Dr. [name] clinic when she has abemaciclib and is ready to start.  Per manufacturing guidelines, we will follow-up with labs 2 weeks after the start date, and then continue every 2-week labs for 2 months, followed by monthly labs for 2 months, and then as clinically  indicated.   Dose Modifications Dr. [name] would like to start abemaciclib at 100 mg every 12 hours  Access Assessment Lauren [name] will be receiving abemaciclib through [facility] Concerns: not known Start date if known: not known  Allergies No Known Allergies  Vitals: No labs were done today for this chemotherapy education visit     11/17/2022    1:06 PM 11/08/2022    8:47 AM 10/04/2022    3:51 PM  Oncology Vitals  Height   168 cm  Weight  99.479 kg 100.336 kg  Weight (lbs)  219 lbs 5 oz 221 lbs 3 oz  BMI  35.4 kg/m2 35.7 kg/m2  Temp 99.4 F (37.4 C) 97.3 F (36.3 C) 97.9 F (36.6 C)  Pulse Rate 80 72 60  BP 155/77 129/54 134/72  Resp 18 18 18   SpO2 100 % 99 % 98 %  BSA (m2)  2.15 m2 2.16 m2    Contraindications Contraindications were reviewed? Yes Contraindications to therapy were identified? No   Safety Precautions The following safety precautions for the use of abemaciclib were reviewed:  Changes in kidney function: importance of drinking plenty of fluids and monitoring urine output Diarrhea: we reviewed that diarrhea is common with abemaciclib and confirmed that she does have loperamide (Imodium) at home.  We reviewed how to take this medication PRN and gave her information on abemaciclib Decreased white blood cells (WBCs) and increased risk for  infection: we discussed the importance of having a thermometer and what the Centers for Disease Control and Prevention (CDC) considers a fever which is 100.51F (38C) or higher.  Gave patient 24/7 triage line to call if any fevers or symptoms Decreased hemoglobin, part of red blood cells that carry iron and oxygen Fatigue Nausea and Vomiting Hepatotoxicity: reviewed to contact clinic for RUQ pain that will not subside, yellowing of eyes/skin Decreased appetite or weight loss Abdominal pain Decreased platelet count and increased risk for bleeding Venous thromboembolism (VTE): reviewed signs  of deep vein thrombosis (DVT) such as leg swelling, redness, pain, or tenderness and signs of pulmonary embolism (PE) such as shortness of breath, rapid or irregular heartbeat, cough, chest pain, or lightheadedness ILD/Pneumonitis: we reviewed potential symptoms including cough, shortness, and fatigue. Handling body fluids and waste Pregnancy, sexual activity, and contraception Reviewed to take the medication every 12 hours (with food sometimes can be easier on the stomach) and to take it at the same time every day. Discussed proper storage and handling of abemaciclib  Medication Reconciliation Current Outpatient Medications  Medication Sig Dispense Refill   abemaciclib (VERZENIO) 100 MG tablet Take 1 tablet (100 mg total) by mouth 2 (two) times daily. Swallow tablets whole. Do not chew, crush, or split tablets before swallowing. 56 tablet 0   ACCU-CHEK GUIDE test strip check sugar 3x daily In Vitro three times daily for 90 days     Accu-Chek Softclix Lancets lancets USE 1 TO CHECK GLUCOSE THREE TIMES DAILY     aspirin 81 MG tablet Take 81 mg by mouth daily.      benazepril-hydrochlorthiazide (LOTENSIN HCT) 20-25 MG tablet Take 1 tablet by mouth daily.     Blood Glucose Monitoring Suppl (ACCU-CHEK GUIDE) w/Device KIT check glucose daily and as needed for 365 days     Cyanocobalamin (B-12) 500 MCG SUBL Place 1 tablet under the tongue daily.     docusate sodium (COLACE) 100 MG capsule Take 100 mg by mouth daily.     EUTHYROX 175 MCG tablet Take 175 mcg by mouth daily before breakfast.     letrozole (FEMARA) 2.5 MG tablet Take 1 tablet (2.5 mg total) by mouth daily. 30 tablet 1   metFORMIN (GLUCOPHAGE) 500 MG tablet 500 mg. 1 tablet with breakfast, 1 tablet with supper     metoprolol succinate (TOPROL XL) 50 MG 24 hr tablet Take 1 tablet (50 mg total) by mouth daily. Take with or immediately following a meal. 90 tablet 2   nystatin cream (MYCOSTATIN) 1 application Externally Twice a day as needed  for 30 days     rosuvastatin (CRESTOR) 40 MG tablet Take 1 tablet (40 mg total) by mouth daily. 90 tablet 2   terbinafine (LAMISIL) 250 MG tablet  (Patient not taking: Reported on 11/08/2022)     triamcinolone (KENALOG) 0.025 % cream 1 application Externally Once a day as neeed     VITAMIN D3 1.25 MG (50000 UT) capsule Take 50,000 Units by mouth once a week.     No current facility-administered medications for this visit.   Medication reconciliation is based on the patient's most recent medication list in the electronic medical record (EMR) including herbal products and OTC medications.   The patient's medication list was reviewed today with the patient? No   Drug-drug interactions (DDIs) DDIs were evaluated? Yes Significant DDIs identified? No   Drug-Food Interactions Drug-food interactions were evaluated? Yes Drug-food interactions identified?  She will avoid grapefruit products  Follow-up Plan  Prescription sent for abemaciclib 100 mg by mouth every 12 hours.  Patient will contact Dr. [name] office prior to starting so labs can be ordered for 2 weeks after start date.  Prescription was sent to [facility] outpatient pharmacy. Continue letrozole 2.5 mg by mouth daily Lauren [name] will see Dr. [name] or clinical pharmacy with labs every 2 weeks for the first 2 months, followed by monthly labs and visits for 2 months, and then as clinically indicated.  May increase dose of abemaciclib if tolerated per Dr. [name].  Lauren [name] participated in the discussion, expressed understanding, and voiced agreement with the above plan. All questions were answered to her satisfaction. The patient was advised to contact the clinic at [phone] with any questions or concerns prior to her return visit.   I spent 60 minutes assessing the patient.  Karilyn Wind A. Odetta Pink, PharmD, BCOP, CPP  Anselm Lis, RPH-CPP, 11/17/2022 1:55 PM  **Disclaimer: This note was dictated with voice recognition  software. Similar sounding words can inadvertently be transcribed and this note may contain transcription errors which may not have been corrected upon publication of note.**

## 2022-11-17 NOTE — Telephone Encounter (Signed)
Oral Oncology Patient Advocate Encounter   Received notification that prior authorization for Verzenio is required.   PA submitted on 11/17/22 Key [id number] Status is pending     Jinger Neighbors, CPhT-Adv Oncology Pharmacy Patient Advocate [facility] Direct Number: [phone]  Fax: [phone]

## 2022-11-17 NOTE — Progress Notes (Signed)
Subjective:   Patient ID: Lauren [name], female   DOB: 67 y.o.   MRN: [id number]   HPI Patient presents stating that she has a painful callus on the medial aspect of the right first metatarsal and has long-term diabetes flatfeet and discomfort at times in the midfoot right along history of Planter fasciitis.  Patient does not smoke and tries to be active   Review of Systems  All other systems reviewed and are negative.       Objective:  Physical Exam Vitals and nursing note reviewed.  Constitutional:      Appearance: She is well-developed.  Pulmonary:     Effort: Pulmonary effort is normal.  Musculoskeletal:        General: Normal range of motion.  Skin:    General: Skin is warm.  Neurological:     Mental Status: She is alert.     Neurovascular status was found to be intact muscle strength was found to be within normal limits.  I did note diminishment of sharp dull vibratory and patient does get some shooting burning pain consistent with probable diabetic neuropathy and is found to have lesion subfirst metatarsal head right keratotic and thick and has depression of the arch bilateral with history of inflammation around posterior tibial tendon and plantar fasciitis.  Patient's tried orthotics could not tolerate does not have any mental cognitive issues and has good digital perfusion     Assessment:  Diabetic neuropathic condition with lesions of first metatarsal head right thick painful at times with collapse medial longitudinal arch bilateral long-term nature with posterior tibial tendinitis at times and plantar fasciitis history     Plan:  H&P reviewed all conditions discussed diabetes and daily inspections.  At this point sharp dissection and debridement of lesion first metatarsal head right no iatrogenic bleeding and advised this can be done as needed discussed possible treatment posterior tibial tendinitis if flared up and wearing supportive shoes

## 2022-11-17 NOTE — Telephone Encounter (Signed)
Oral Oncology Patient Advocate Encounter   Was successful in obtaining a copay card for Verzenio.  This copay card will make the patients copay $0.  The billing information is as follows and has been shared with [facility].   RxBin: [id number] PCN: [id number] Member ID: [id number] Group ID: [id number]   Jinger Neighbors, CPhT-Adv Oncology Pharmacy Patient Advocate [facility] Direct Number: [phone]  Fax: [phone]

## 2022-11-17 NOTE — Telephone Encounter (Signed)
Oral Oncology Patient Advocate Encounter  Prior Authorization for Lauren [name] has been approved.    PA# [id number] Effective dates: 11/17/22 through 11/17/23  Patient must fill through [facility].    Jinger Neighbors, CPhT-Adv Oncology Pharmacy Patient Advocate [facility] Direct Number: [phone]  Fax: [phone]

## 2022-11-18 ENCOUNTER — Telehealth: Payer: Self-pay

## 2022-11-18 ENCOUNTER — Inpatient Hospital Stay: Payer: BC Managed Care – PPO

## 2022-11-18 ENCOUNTER — Telehealth: Payer: Self-pay | Admitting: Pharmacist

## 2022-11-18 DIAGNOSIS — Z9049 Acquired absence of other specified parts of digestive tract: Secondary | ICD-10-CM | POA: Diagnosis not present

## 2022-11-18 DIAGNOSIS — Z83719 Family history of colon polyps, unspecified: Secondary | ICD-10-CM | POA: Diagnosis not present

## 2022-11-18 DIAGNOSIS — C50412 Malignant neoplasm of upper-outer quadrant of left female breast: Secondary | ICD-10-CM | POA: Diagnosis not present

## 2022-11-18 DIAGNOSIS — M25572 Pain in left ankle and joints of left foot: Secondary | ICD-10-CM | POA: Diagnosis not present

## 2022-11-18 DIAGNOSIS — R197 Diarrhea, unspecified: Secondary | ICD-10-CM | POA: Diagnosis not present

## 2022-11-18 DIAGNOSIS — Z170 Estrogen receptor positive status [ER+]: Secondary | ICD-10-CM | POA: Diagnosis not present

## 2022-11-18 DIAGNOSIS — Z79899 Other long term (current) drug therapy: Secondary | ICD-10-CM | POA: Diagnosis not present

## 2022-11-18 DIAGNOSIS — E785 Hyperlipidemia, unspecified: Secondary | ICD-10-CM | POA: Diagnosis not present

## 2022-11-18 DIAGNOSIS — R609 Edema, unspecified: Secondary | ICD-10-CM | POA: Diagnosis not present

## 2022-11-18 DIAGNOSIS — Z79811 Long term (current) use of aromatase inhibitors: Secondary | ICD-10-CM | POA: Diagnosis not present

## 2022-11-18 DIAGNOSIS — E119 Type 2 diabetes mellitus without complications: Secondary | ICD-10-CM | POA: Diagnosis not present

## 2022-11-18 DIAGNOSIS — Z9884 Bariatric surgery status: Secondary | ICD-10-CM | POA: Diagnosis not present

## 2022-11-18 DIAGNOSIS — Z833 Family history of diabetes mellitus: Secondary | ICD-10-CM | POA: Diagnosis not present

## 2022-11-18 DIAGNOSIS — M79605 Pain in left leg: Secondary | ICD-10-CM | POA: Diagnosis not present

## 2022-11-18 DIAGNOSIS — Z90710 Acquired absence of both cervix and uterus: Secondary | ICD-10-CM | POA: Diagnosis not present

## 2022-11-18 LAB — CBC WITH DIFFERENTIAL (CANCER CENTER ONLY)
Abs Immature Granulocytes: 0.02 10*3/uL (ref 0.00–0.07)
Basophils Absolute: 0.1 10*3/uL (ref 0.0–0.1)
Basophils Relative: 1 %
Eosinophils Absolute: 0.2 10*3/uL (ref 0.0–0.5)
Eosinophils Relative: 3 %
HCT: 38 % (ref 36.0–46.0)
Hemoglobin: 13.5 g/dL (ref 12.0–15.0)
Immature Granulocytes: 0 %
Lymphocytes Relative: 14 %
Lymphs Abs: 1 10*3/uL (ref 0.7–4.0)
MCH: 31.6 pg (ref 26.0–34.0)
MCHC: 35.5 g/dL (ref 30.0–36.0)
MCV: 89 fL (ref 80.0–100.0)
Monocytes Absolute: 0.5 10*3/uL (ref 0.1–1.0)
Monocytes Relative: 7 %
Neutro Abs: 5.2 10*3/uL (ref 1.7–7.7)
Neutrophils Relative %: 75 %
Platelet Count: 176 10*3/uL (ref 150–400)
RBC: 4.27 MIL/uL (ref 3.87–5.11)
RDW: 12.4 % (ref 11.5–15.5)
WBC Count: 6.9 10*3/uL (ref 4.0–10.5)
nRBC: 0 % (ref 0.0–0.2)

## 2022-11-18 LAB — CMP (CANCER CENTER ONLY)
ALT: 16 U/L (ref 0–44)
AST: 18 U/L (ref 15–41)
Albumin: 4.2 g/dL (ref 3.5–5.0)
Alkaline Phosphatase: 79 U/L (ref 38–126)
Anion gap: 10 (ref 5–15)
BUN: 14 mg/dL (ref 8–23)
CO2: 26 mmol/L (ref 22–32)
Calcium: 10 mg/dL (ref 8.9–10.3)
Chloride: 104 mmol/L (ref 98–111)
Creatinine: 0.71 mg/dL (ref 0.44–1.00)
GFR, Estimated: >60 mL/min (ref >60)
Glucose, Bld: 193 mg/dL — ABNORMAL HIGH (ref 70–99)
Potassium: 3.6 mmol/L (ref 3.5–5.1)
Sodium: 140 mmol/L (ref 135–145)
Total Bilirubin: 0.6 mg/dL (ref 0.3–1.2)
Total Protein: 6.9 g/dL (ref 6.5–8.1)

## 2022-11-18 NOTE — Telephone Encounter (Signed)
Oral Oncology Pharmacist Encounter  Received new prescription for Verzenio (abemaciclib) for the treatment of breast cancer in conjunction with letrozole, planned duration un.  Labs from 11/18/22 (CBC, CMP) assessed, no adjustments needed.  Prescription dose and frequency assessed for appropriateness.  Current medication list in Epic reviewed, DDIs with Verzenio identified: - metformin (cat C): verzenio may increase concentration of metformin. Patient will monitor for increased side effects.  Evaluated chart and no patient barriers to medication adherence noted.   Patient agreement for treatment documented in note on 11/17/2022 with pharmacist seeing patient in clinic.  Prescription has been e-scribed to the [facility] for benefits analysis and approval.   Oral Oncology Clinic will continue to follow for insurance authorization, copayment issues, initial counseling and start date. Patient has already been counseled by pharmacist, Jonny Ruiz, on 11/17/2022.  [name], PharmD Hematology/Oncology Clinical Pharmacist [facility] Oral Chemotherapy Navigation [facility] [phone] 11/18/2022 8:32 AM

## 2022-11-27 ENCOUNTER — Other Ambulatory Visit: Payer: Self-pay | Admitting: Adult Health

## 2022-11-27 DIAGNOSIS — Z170 Estrogen receptor positive status [ER+]: Secondary | ICD-10-CM

## 2022-11-28 ENCOUNTER — Telehealth: Payer: Self-pay

## 2022-11-28 ENCOUNTER — Other Ambulatory Visit: Payer: Self-pay

## 2022-11-28 DIAGNOSIS — Z79811 Long term (current) use of aromatase inhibitors: Secondary | ICD-10-CM

## 2022-11-28 DIAGNOSIS — Z170 Estrogen receptor positive status [ER+]: Secondary | ICD-10-CM

## 2022-11-28 NOTE — Telephone Encounter (Signed)
Pt called and LVM asking about signatera testing, verzenio and need for SOZO screens. Attempted to return pt's call. LVM for call back. Referral placed to [facility] for SOZO screens per MD.

## 2022-11-30 ENCOUNTER — Other Ambulatory Visit: Payer: Self-pay | Admitting: Hematology and Oncology

## 2022-11-30 NOTE — Telephone Encounter (Signed)
Faxed Guardant reveal per Md to [phone]. Confirmation was successfully received.

## 2022-12-01 ENCOUNTER — Other Ambulatory Visit: Payer: Self-pay | Admitting: Hematology and Oncology

## 2022-12-01 ENCOUNTER — Encounter: Payer: Self-pay | Admitting: *Deleted

## 2022-12-01 ENCOUNTER — Telehealth: Payer: Self-pay | Admitting: *Deleted

## 2022-12-01 DIAGNOSIS — C50412 Malignant neoplasm of upper-outer quadrant of left female breast: Secondary | ICD-10-CM

## 2022-12-01 NOTE — Progress Notes (Signed)
Per MD request Guardant Reveal orders successfully faxed.

## 2022-12-01 NOTE — Telephone Encounter (Signed)
Pt called with several concerns today, first pain in BLE with some swelling and warmness. Pt was scheduled for doppler and appt with [facility] clinic. Also, pt wanted to have guardant reveal done in clinic instead of mobile. Pt needed to cancel appt with Jonny Ruiz [facility] on 10/24 and be rescheduled after the start of Verzinio on Nov. 4th. Message was sent to Jonny Ruiz, [facility] as well as request for nausea medicine to help with symptoms from Verzinio. I have educated pt and sent all request and made all necessary appts. Pt verbalized understanding.

## 2022-12-01 NOTE — Telephone Encounter (Signed)
Error. Nam Vossler M Aleka Twitty, RN  

## 2022-12-02 ENCOUNTER — Ambulatory Visit (HOSPITAL_COMMUNITY)
Admission: RE | Admit: 2022-12-02 | Discharge: 2022-12-02 | Disposition: A | Discharge status: Home / Self Care | Payer: BC Managed Care – PPO | Source: Ambulatory Visit | Attending: Hematology and Oncology | Admitting: Hematology and Oncology

## 2022-12-02 ENCOUNTER — Inpatient Hospital Stay: Payer: BC Managed Care – PPO

## 2022-12-02 ENCOUNTER — Telehealth: Payer: Self-pay | Admitting: Hematology and Oncology

## 2022-12-02 ENCOUNTER — Encounter: Payer: Self-pay | Admitting: Physician Assistant

## 2022-12-02 ENCOUNTER — Inpatient Hospital Stay (HOSPITAL_BASED_OUTPATIENT_CLINIC_OR_DEPARTMENT_OTHER): Payer: BC Managed Care – PPO | Admitting: Physician Assistant

## 2022-12-02 VITALS — BP 149/88 | HR 75 | Temp 98.1°F | Resp 18 | Ht 66.0 in | Wt 222.2 lb

## 2022-12-02 DIAGNOSIS — M25572 Pain in left ankle and joints of left foot: Secondary | ICD-10-CM | POA: Diagnosis not present

## 2022-12-02 DIAGNOSIS — M79605 Pain in left leg: Secondary | ICD-10-CM | POA: Diagnosis not present

## 2022-12-02 DIAGNOSIS — Z79899 Other long term (current) drug therapy: Secondary | ICD-10-CM | POA: Diagnosis not present

## 2022-12-02 DIAGNOSIS — C50412 Malignant neoplasm of upper-outer quadrant of left female breast: Secondary | ICD-10-CM

## 2022-12-02 DIAGNOSIS — Z90710 Acquired absence of both cervix and uterus: Secondary | ICD-10-CM | POA: Diagnosis not present

## 2022-12-02 DIAGNOSIS — Z9049 Acquired absence of other specified parts of digestive tract: Secondary | ICD-10-CM | POA: Diagnosis not present

## 2022-12-02 DIAGNOSIS — Z170 Estrogen receptor positive status [ER+]: Secondary | ICD-10-CM | POA: Insufficient documentation

## 2022-12-02 DIAGNOSIS — Z79811 Long term (current) use of aromatase inhibitors: Secondary | ICD-10-CM | POA: Diagnosis not present

## 2022-12-02 DIAGNOSIS — Z83719 Family history of colon polyps, unspecified: Secondary | ICD-10-CM | POA: Diagnosis not present

## 2022-12-02 DIAGNOSIS — Z9884 Bariatric surgery status: Secondary | ICD-10-CM | POA: Diagnosis not present

## 2022-12-02 DIAGNOSIS — E785 Hyperlipidemia, unspecified: Secondary | ICD-10-CM | POA: Diagnosis not present

## 2022-12-02 DIAGNOSIS — R609 Edema, unspecified: Secondary | ICD-10-CM | POA: Diagnosis not present

## 2022-12-02 DIAGNOSIS — R197 Diarrhea, unspecified: Secondary | ICD-10-CM | POA: Diagnosis not present

## 2022-12-02 DIAGNOSIS — Z833 Family history of diabetes mellitus: Secondary | ICD-10-CM | POA: Diagnosis not present

## 2022-12-02 DIAGNOSIS — E119 Type 2 diabetes mellitus without complications: Secondary | ICD-10-CM | POA: Diagnosis not present

## 2022-12-02 MED ORDER — ONDANSETRON HCL 8 MG PO TABS: 8.0000 mg | ORAL_TABLET | Freq: Three times a day (TID) | ORAL | 2 refills | Status: AC | PRN

## 2022-12-02 NOTE — Progress Notes (Signed)
Symptom Management Consult Note [facility]    Patient Care Team: Camie Patience, FNP as PCP - General (Family Medicine) Meriam Sprague, MD (Inactive) as PCP - Cardiology (Cardiology) Abigail Miyamoto, MD as Consulting Physician (General Surgery) Rachel Moulds, MD as Consulting Physician (Hematology and Oncology) Dorothy Puffer, MD as Consulting Physician (Radiation Oncology)    Name / MRN / DOB: Lauren [name]  [id number]  Jun 26, 1955   Date of visit: 12/02/2022   Chief Complaint/Reason for visit: leg pain   Current Therapy: Letrozole since 10/04/22     ASSESSMENT & PLAN: Patient is a 67 y.o. female with oncologic history of malignant neoplasm of upper-outer quadrant of left breast in female, estrogen receptor positive followed by Dr. Al Pimple.  I have viewed most recent oncology note and lab work.    #Malignant neoplasm of upper-outer quadrant of left breast in female, estrogen receptor positive  - Next appointment with oncologist is 01/02/23   #Leg pain -US  performed PTA. Reviewed with patient that [place] is negative for DVT.  -PE lower extremities is unremarkable.  - Discussed AE of Letrozole with patient. She is not experiencing severe arthralgias, or myalgias. Edema could be related to Letrozole and also her sedentary lifestyle as she sits at a desk for work. Dicussed the importance of elevation and compressions socks. She will continue Letrozole as prescribed and closely monitor her symptoms. She is nervous about starting Verzenio next moth.   Strict ED precautions discussed should symptoms worsen.   Heme/Onc History: Oncology History  Malignant neoplasm of upper-outer quadrant of left breast in female, estrogen receptor positive (HCC)  03/04/2022 Mammogram   Screening mammogram showed calcs and possible distortion in the left breast. Diag mammogram showed suspicious calcs and non mass finding in the 2 0 clock location of the left breast  warranting further tissue diagnosis. No left axillary adenopathy   03/10/2022 Breast US   Suspicious calcifications and non mass enhancement measuring approximately 4.9 cm in largest dimension.   03/23/2022 Cancer Staging   Staging form: Breast, AJCC 8th Edition - Pathologic: Stage IA (pT1b, pN1a, cM0, G2, ER+, PR+, HER2-) - Signed by Rachel Moulds, MD on 05/06/2022 Histologic grading system: 3 grade system   03/25/2022 Pathology Results   Pathology showed grade 2 IDC, ER positive, PR positive, her 2 neg, Ki 67 30%   03/31/2022 Genetic Testing   Negative. The Multi-Cancer + RNA Panel offered by [facility] includes sequencing and/or deletion/duplication analysis of the following 70 genes:  AIP*, ALK, APC*, ATM*, AXIN2*, BAP1*, BARD1*, BLM*, BMPR1A*, BRCA1*, BRCA2*, BRIP1*, CDC73*, CDH1*, CDK4, CDKN1B*, CDKN2A, CHEK2*, CTNNA1*, DICER1*, EPCAM (del/dup only), EGFR, FH*, FLCN*, GREM1 (promoter dup only), HOXB13, KIT, LZTR1, MAX*, MBD4, MEN1*, MET, MITF, MLH1*, MSH2*, MSH3*, MSH6*, MUTYH*, NF1*, NF2*, NTHL1*, PALB2*, PDGFRA, PMS2*, POLD1*, POLE*, POT1*, PRKAR1A*, PTCH1*, PTEN*, RAD51C*, RAD51D*, RB1*, RET, SDHA* (sequencing only), SDHAF2*, SDHB*, SDHC*, SDHD*, SMAD4*, SMARCA4*, SMARCB1*, SMARCE1*, STK11*, SUFU*, TMEM127*, TP53*, TSC1*, TSC2*, VHL*. RNA analysis is performed for * genes.   04/14/2022 Surgery   Left breast mastectomy: IDC, grade 2, 0.8cm, 3/17 LN positive for macrometastases, margins negative.    05/09/2022 Oncotype testing   10/12%   06/06/2022 - 07/21/2022 Radiation Therapy   Plan Name: CW_L_BO_BH Site: Chest Wall, Left Technique: 3D Mode: Photon Dose Per Fraction: 1.8 Gy Prescribed Dose (Delivered / Prescribed): 25.2 Gy / 25.2 Gy Prescribed Fxs (Delivered / Prescribed): 14 / 14   Plan Name: CW_PAB_SCV_BH Site: Chest Wall, Left Technique: 3D Mode:  PHYSICAL EXAM: ECOG FS:0 - Asymptomatic    Vitals:   12/02/22 0918  BP: (!) 149/88  Pulse: 75  Resp: 18  Temp: 98.1 F (36.7 C)  TempSrc: Oral  SpO2: 100%  Weight: 222 lb 3.2 oz (100.8 kg)  Height: 5\' 6"  (1.676 m)   Physical Exam Vitals and nursing note reviewed.  Constitutional:      Appearance: She is not ill-appearing or toxic-appearing.  HENT:     Head: Normocephalic.  Eyes:     Conjunctiva/sclera: Conjunctivae normal.  Cardiovascular:     Rate and Rhythm: Normal rate.  Pulmonary:     Effort: Pulmonary effort is normal.  Abdominal:     General: There is no distension.  Musculoskeletal:     Cervical back: Normal range of motion.     Right lower leg: No edema.     Left lower leg: No edema.     Comments: Homans sign absent bilaterally, no lower extremity edema, no palpable cords, compartments are soft.  Ambulatory with normal gait.  Skin:    General: Skin is warm and dry.  Neurological:     Mental Status: She is alert.        LABORATORY DATA: I have reviewed the data as listed    Latest Ref Rng & Units 11/18/2022   12:33 PM 03/23/2022    8:09 AM 10/05/2015    8:52 PM  CBC  WBC 4.0 - 10.5 K/uL 6.9  8.8  5.4   Hemoglobin 12.0 - 15.0 g/dL 95.6  21.3  08.6   Hematocrit 36.0 - 46.0 % 38.0  40.5  39.0   Platelets 150 - 400 K/uL 176  182  162         Latest Ref Rng & Units 11/18/2022   12:33 PM 04/11/2022    9:17 AM 03/25/2022    8:01 AM  CMP  Glucose 70 - 99 mg/dL 578  469    BUN 8 - 23 mg/dL 14  9    Creatinine 6.29 - 1.00 mg/dL 5.28  4.13    Sodium 244 - 145 mmol/L 140  140    Potassium 3.5 - 5.1 mmol/L 3.6  3.8    Chloride 98 - 111 mmol/L 104  104    CO2 22 - 32 mmol/L  26  25    Calcium 8.9 - 10.3 mg/dL 01.0  9.0    Total Protein 6.5 - 8.1 g/dL 6.9     Total Bilirubin 0.3 - 1.2 mg/dL 0.6     Alkaline Phos 38 - 126 U/L 79     AST 15 - 41 U/L 18     ALT 0 - 44 U/L 16   14        RADIOGRAPHIC STUDIES (from last 24 hours if applicable) I have personally reviewed the radiological images as listed and agreed with the findings in the report. VAS [place] LOWER EXTREMITY VENOUS (DVT)  Result Date: 12/02/2022  Lower Venous DVT Study Patient Name:  Lauren [name]  Date of Exam:   12/02/2022 Medical Rec #: [id number]         Accession #:    [id number] Date of Birth: March 05, 1955        Patient Gender: F Patient Age:   67 years Exam Location:  [facility] Procedure:      VAS [place] LOWER EXTREMITY VENOUS (DVT) Referring Phys: Rachel Moulds --------------------------------------------------------------------------------  Indications: Pain.  Risk Factors: Cancer History of breast cancer. Performing Technologist: Thereasa Parkin  PHYSICAL EXAM: ECOG FS:0 - Asymptomatic    Vitals:   12/02/22 0918  BP: (!) 149/88  Pulse: 75  Resp: 18  Temp: 98.1 F (36.7 C)  TempSrc: Oral  SpO2: 100%  Weight: 222 lb 3.2 oz (100.8 kg)  Height: 5\' 6"  (1.676 m)   Physical Exam Vitals and nursing note reviewed.  Constitutional:      Appearance: She is not ill-appearing or toxic-appearing.  HENT:     Head: Normocephalic.  Eyes:     Conjunctiva/sclera: Conjunctivae normal.  Cardiovascular:     Rate and Rhythm: Normal rate.  Pulmonary:     Effort: Pulmonary effort is normal.  Abdominal:     General: There is no distension.  Musculoskeletal:     Cervical back: Normal range of motion.     Right lower leg: No edema.     Left lower leg: No edema.     Comments: Homans sign absent bilaterally, no lower extremity edema, no palpable cords, compartments are soft.  Ambulatory with normal gait.  Skin:    General: Skin is warm and dry.  Neurological:     Mental Status: She is alert.        LABORATORY DATA: I have reviewed the data as listed    Latest Ref Rng & Units 11/18/2022   12:33 PM 03/23/2022    8:09 AM 10/05/2015    8:52 PM  CBC  WBC 4.0 - 10.5 K/uL 6.9  8.8  5.4   Hemoglobin 12.0 - 15.0 g/dL 95.6  21.3  08.6   Hematocrit 36.0 - 46.0 % 38.0  40.5  39.0   Platelets 150 - 400 K/uL 176  182  162         Latest Ref Rng & Units 11/18/2022   12:33 PM 04/11/2022    9:17 AM 03/25/2022    8:01 AM  CMP  Glucose 70 - 99 mg/dL 578  469    BUN 8 - 23 mg/dL 14  9    Creatinine 6.29 - 1.00 mg/dL 5.28  4.13    Sodium 244 - 145 mmol/L 140  140    Potassium 3.5 - 5.1 mmol/L 3.6  3.8    Chloride 98 - 111 mmol/L 104  104    CO2 22 - 32 mmol/L  26  25    Calcium 8.9 - 10.3 mg/dL 01.0  9.0    Total Protein 6.5 - 8.1 g/dL 6.9     Total Bilirubin 0.3 - 1.2 mg/dL 0.6     Alkaline Phos 38 - 126 U/L 79     AST 15 - 41 U/L 18     ALT 0 - 44 U/L 16   14        RADIOGRAPHIC STUDIES (from last 24 hours if applicable) I have personally reviewed the radiological images as listed and agreed with the findings in the report. VAS [place] LOWER EXTREMITY VENOUS (DVT)  Result Date: 12/02/2022  Lower Venous DVT Study Patient Name:  Lauren [name]  Date of Exam:   12/02/2022 Medical Rec #: [id number]         Accession #:    [id number] Date of Birth: March 05, 1955        Patient Gender: F Patient Age:   67 years Exam Location:  [facility] Procedure:      VAS [place] LOWER EXTREMITY VENOUS (DVT) Referring Phys: Rachel Moulds --------------------------------------------------------------------------------  Indications: Pain.  Risk Factors: Cancer History of breast cancer. Performing Technologist: Thereasa Parkin  Symptom Management Consult Note [facility]    Patient Care Team: Camie Patience, FNP as PCP - General (Family Medicine) Meriam Sprague, MD (Inactive) as PCP - Cardiology (Cardiology) Abigail Miyamoto, MD as Consulting Physician (General Surgery) Rachel Moulds, MD as Consulting Physician (Hematology and Oncology) Dorothy Puffer, MD as Consulting Physician (Radiation Oncology)    Name / MRN / DOB: Lauren [name]  [id number]  Jun 26, 1955   Date of visit: 12/02/2022   Chief Complaint/Reason for visit: leg pain   Current Therapy: Letrozole since 10/04/22     ASSESSMENT & PLAN: Patient is a 67 y.o. female with oncologic history of malignant neoplasm of upper-outer quadrant of left breast in female, estrogen receptor positive followed by Dr. Al Pimple.  I have viewed most recent oncology note and lab work.    #Malignant neoplasm of upper-outer quadrant of left breast in female, estrogen receptor positive  - Next appointment with oncologist is 01/02/23   #Leg pain -US  performed PTA. Reviewed with patient that [place] is negative for DVT.  -PE lower extremities is unremarkable.  - Discussed AE of Letrozole with patient. She is not experiencing severe arthralgias, or myalgias. Edema could be related to Letrozole and also her sedentary lifestyle as she sits at a desk for work. Dicussed the importance of elevation and compressions socks. She will continue Letrozole as prescribed and closely monitor her symptoms. She is nervous about starting Verzenio next moth.   Strict ED precautions discussed should symptoms worsen.   Heme/Onc History: Oncology History  Malignant neoplasm of upper-outer quadrant of left breast in female, estrogen receptor positive (HCC)  03/04/2022 Mammogram   Screening mammogram showed calcs and possible distortion in the left breast. Diag mammogram showed suspicious calcs and non mass finding in the 2 0 clock location of the left breast  warranting further tissue diagnosis. No left axillary adenopathy   03/10/2022 Breast US   Suspicious calcifications and non mass enhancement measuring approximately 4.9 cm in largest dimension.   03/23/2022 Cancer Staging   Staging form: Breast, AJCC 8th Edition - Pathologic: Stage IA (pT1b, pN1a, cM0, G2, ER+, PR+, HER2-) - Signed by Rachel Moulds, MD on 05/06/2022 Histologic grading system: 3 grade system   03/25/2022 Pathology Results   Pathology showed grade 2 IDC, ER positive, PR positive, her 2 neg, Ki 67 30%   03/31/2022 Genetic Testing   Negative. The Multi-Cancer + RNA Panel offered by [facility] includes sequencing and/or deletion/duplication analysis of the following 70 genes:  AIP*, ALK, APC*, ATM*, AXIN2*, BAP1*, BARD1*, BLM*, BMPR1A*, BRCA1*, BRCA2*, BRIP1*, CDC73*, CDH1*, CDK4, CDKN1B*, CDKN2A, CHEK2*, CTNNA1*, DICER1*, EPCAM (del/dup only), EGFR, FH*, FLCN*, GREM1 (promoter dup only), HOXB13, KIT, LZTR1, MAX*, MBD4, MEN1*, MET, MITF, MLH1*, MSH2*, MSH3*, MSH6*, MUTYH*, NF1*, NF2*, NTHL1*, PALB2*, PDGFRA, PMS2*, POLD1*, POLE*, POT1*, PRKAR1A*, PTCH1*, PTEN*, RAD51C*, RAD51D*, RB1*, RET, SDHA* (sequencing only), SDHAF2*, SDHB*, SDHC*, SDHD*, SMAD4*, SMARCA4*, SMARCB1*, SMARCE1*, STK11*, SUFU*, TMEM127*, TP53*, TSC1*, TSC2*, VHL*. RNA analysis is performed for * genes.   04/14/2022 Surgery   Left breast mastectomy: IDC, grade 2, 0.8cm, 3/17 LN positive for macrometastases, margins negative.    05/09/2022 Oncotype testing   10/12%   06/06/2022 - 07/21/2022 Radiation Therapy   Plan Name: CW_L_BO_BH Site: Chest Wall, Left Technique: 3D Mode: Photon Dose Per Fraction: 1.8 Gy Prescribed Dose (Delivered / Prescribed): 25.2 Gy / 25.2 Gy Prescribed Fxs (Delivered / Prescribed): 14 / 14   Plan Name: CW_PAB_SCV_BH Site: Chest Wall, Left Technique: 3D Mode:  Symptom Management Consult Note [facility]    Patient Care Team: Camie Patience, FNP as PCP - General (Family Medicine) Meriam Sprague, MD (Inactive) as PCP - Cardiology (Cardiology) Abigail Miyamoto, MD as Consulting Physician (General Surgery) Rachel Moulds, MD as Consulting Physician (Hematology and Oncology) Dorothy Puffer, MD as Consulting Physician (Radiation Oncology)    Name / MRN / DOB: Lauren [name]  [id number]  Jun 26, 1955   Date of visit: 12/02/2022   Chief Complaint/Reason for visit: leg pain   Current Therapy: Letrozole since 10/04/22     ASSESSMENT & PLAN: Patient is a 67 y.o. female with oncologic history of malignant neoplasm of upper-outer quadrant of left breast in female, estrogen receptor positive followed by Dr. Al Pimple.  I have viewed most recent oncology note and lab work.    #Malignant neoplasm of upper-outer quadrant of left breast in female, estrogen receptor positive  - Next appointment with oncologist is 01/02/23   #Leg pain -US  performed PTA. Reviewed with patient that [place] is negative for DVT.  -PE lower extremities is unremarkable.  - Discussed AE of Letrozole with patient. She is not experiencing severe arthralgias, or myalgias. Edema could be related to Letrozole and also her sedentary lifestyle as she sits at a desk for work. Dicussed the importance of elevation and compressions socks. She will continue Letrozole as prescribed and closely monitor her symptoms. She is nervous about starting Verzenio next moth.   Strict ED precautions discussed should symptoms worsen.   Heme/Onc History: Oncology History  Malignant neoplasm of upper-outer quadrant of left breast in female, estrogen receptor positive (HCC)  03/04/2022 Mammogram   Screening mammogram showed calcs and possible distortion in the left breast. Diag mammogram showed suspicious calcs and non mass finding in the 2 0 clock location of the left breast  warranting further tissue diagnosis. No left axillary adenopathy   03/10/2022 Breast US   Suspicious calcifications and non mass enhancement measuring approximately 4.9 cm in largest dimension.   03/23/2022 Cancer Staging   Staging form: Breast, AJCC 8th Edition - Pathologic: Stage IA (pT1b, pN1a, cM0, G2, ER+, PR+, HER2-) - Signed by Rachel Moulds, MD on 05/06/2022 Histologic grading system: 3 grade system   03/25/2022 Pathology Results   Pathology showed grade 2 IDC, ER positive, PR positive, her 2 neg, Ki 67 30%   03/31/2022 Genetic Testing   Negative. The Multi-Cancer + RNA Panel offered by [facility] includes sequencing and/or deletion/duplication analysis of the following 70 genes:  AIP*, ALK, APC*, ATM*, AXIN2*, BAP1*, BARD1*, BLM*, BMPR1A*, BRCA1*, BRCA2*, BRIP1*, CDC73*, CDH1*, CDK4, CDKN1B*, CDKN2A, CHEK2*, CTNNA1*, DICER1*, EPCAM (del/dup only), EGFR, FH*, FLCN*, GREM1 (promoter dup only), HOXB13, KIT, LZTR1, MAX*, MBD4, MEN1*, MET, MITF, MLH1*, MSH2*, MSH3*, MSH6*, MUTYH*, NF1*, NF2*, NTHL1*, PALB2*, PDGFRA, PMS2*, POLD1*, POLE*, POT1*, PRKAR1A*, PTCH1*, PTEN*, RAD51C*, RAD51D*, RB1*, RET, SDHA* (sequencing only), SDHAF2*, SDHB*, SDHC*, SDHD*, SMAD4*, SMARCA4*, SMARCB1*, SMARCE1*, STK11*, SUFU*, TMEM127*, TP53*, TSC1*, TSC2*, VHL*. RNA analysis is performed for * genes.   04/14/2022 Surgery   Left breast mastectomy: IDC, grade 2, 0.8cm, 3/17 LN positive for macrometastases, margins negative.    05/09/2022 Oncotype testing   10/12%   06/06/2022 - 07/21/2022 Radiation Therapy   Plan Name: CW_L_BO_BH Site: Chest Wall, Left Technique: 3D Mode: Photon Dose Per Fraction: 1.8 Gy Prescribed Dose (Delivered / Prescribed): 25.2 Gy / 25.2 Gy Prescribed Fxs (Delivered / Prescribed): 14 / 14   Plan Name: CW_PAB_SCV_BH Site: Chest Wall, Left Technique: 3D Mode:  Symptom Management Consult Note [facility]    Patient Care Team: Camie Patience, FNP as PCP - General (Family Medicine) Meriam Sprague, MD (Inactive) as PCP - Cardiology (Cardiology) Abigail Miyamoto, MD as Consulting Physician (General Surgery) Rachel Moulds, MD as Consulting Physician (Hematology and Oncology) Dorothy Puffer, MD as Consulting Physician (Radiation Oncology)    Name / MRN / DOB: Lauren [name]  [id number]  Jun 26, 1955   Date of visit: 12/02/2022   Chief Complaint/Reason for visit: leg pain   Current Therapy: Letrozole since 10/04/22     ASSESSMENT & PLAN: Patient is a 67 y.o. female with oncologic history of malignant neoplasm of upper-outer quadrant of left breast in female, estrogen receptor positive followed by Dr. Al Pimple.  I have viewed most recent oncology note and lab work.    #Malignant neoplasm of upper-outer quadrant of left breast in female, estrogen receptor positive  - Next appointment with oncologist is 01/02/23   #Leg pain -US  performed PTA. Reviewed with patient that [place] is negative for DVT.  -PE lower extremities is unremarkable.  - Discussed AE of Letrozole with patient. She is not experiencing severe arthralgias, or myalgias. Edema could be related to Letrozole and also her sedentary lifestyle as she sits at a desk for work. Dicussed the importance of elevation and compressions socks. She will continue Letrozole as prescribed and closely monitor her symptoms. She is nervous about starting Verzenio next moth.   Strict ED precautions discussed should symptoms worsen.   Heme/Onc History: Oncology History  Malignant neoplasm of upper-outer quadrant of left breast in female, estrogen receptor positive (HCC)  03/04/2022 Mammogram   Screening mammogram showed calcs and possible distortion in the left breast. Diag mammogram showed suspicious calcs and non mass finding in the 2 0 clock location of the left breast  warranting further tissue diagnosis. No left axillary adenopathy   03/10/2022 Breast US   Suspicious calcifications and non mass enhancement measuring approximately 4.9 cm in largest dimension.   03/23/2022 Cancer Staging   Staging form: Breast, AJCC 8th Edition - Pathologic: Stage IA (pT1b, pN1a, cM0, G2, ER+, PR+, HER2-) - Signed by Rachel Moulds, MD on 05/06/2022 Histologic grading system: 3 grade system   03/25/2022 Pathology Results   Pathology showed grade 2 IDC, ER positive, PR positive, her 2 neg, Ki 67 30%   03/31/2022 Genetic Testing   Negative. The Multi-Cancer + RNA Panel offered by [facility] includes sequencing and/or deletion/duplication analysis of the following 70 genes:  AIP*, ALK, APC*, ATM*, AXIN2*, BAP1*, BARD1*, BLM*, BMPR1A*, BRCA1*, BRCA2*, BRIP1*, CDC73*, CDH1*, CDK4, CDKN1B*, CDKN2A, CHEK2*, CTNNA1*, DICER1*, EPCAM (del/dup only), EGFR, FH*, FLCN*, GREM1 (promoter dup only), HOXB13, KIT, LZTR1, MAX*, MBD4, MEN1*, MET, MITF, MLH1*, MSH2*, MSH3*, MSH6*, MUTYH*, NF1*, NF2*, NTHL1*, PALB2*, PDGFRA, PMS2*, POLD1*, POLE*, POT1*, PRKAR1A*, PTCH1*, PTEN*, RAD51C*, RAD51D*, RB1*, RET, SDHA* (sequencing only), SDHAF2*, SDHB*, SDHC*, SDHD*, SMAD4*, SMARCA4*, SMARCB1*, SMARCE1*, STK11*, SUFU*, TMEM127*, TP53*, TSC1*, TSC2*, VHL*. RNA analysis is performed for * genes.   04/14/2022 Surgery   Left breast mastectomy: IDC, grade 2, 0.8cm, 3/17 LN positive for macrometastases, margins negative.    05/09/2022 Oncotype testing   10/12%   06/06/2022 - 07/21/2022 Radiation Therapy   Plan Name: CW_L_BO_BH Site: Chest Wall, Left Technique: 3D Mode: Photon Dose Per Fraction: 1.8 Gy Prescribed Dose (Delivered / Prescribed): 25.2 Gy / 25.2 Gy Prescribed Fxs (Delivered / Prescribed): 14 / 14   Plan Name: CW_PAB_SCV_BH Site: Chest Wall, Left Technique: 3D Mode:  PHYSICAL EXAM: ECOG FS:0 - Asymptomatic    Vitals:   12/02/22 0918  BP: (!) 149/88  Pulse: 75  Resp: 18  Temp: 98.1 F (36.7 C)  TempSrc: Oral  SpO2: 100%  Weight: 222 lb 3.2 oz (100.8 kg)  Height: 5\' 6"  (1.676 m)   Physical Exam Vitals and nursing note reviewed.  Constitutional:      Appearance: She is not ill-appearing or toxic-appearing.  HENT:     Head: Normocephalic.  Eyes:     Conjunctiva/sclera: Conjunctivae normal.  Cardiovascular:     Rate and Rhythm: Normal rate.  Pulmonary:     Effort: Pulmonary effort is normal.  Abdominal:     General: There is no distension.  Musculoskeletal:     Cervical back: Normal range of motion.     Right lower leg: No edema.     Left lower leg: No edema.     Comments: Homans sign absent bilaterally, no lower extremity edema, no palpable cords, compartments are soft.  Ambulatory with normal gait.  Skin:    General: Skin is warm and dry.  Neurological:     Mental Status: She is alert.        LABORATORY DATA: I have reviewed the data as listed    Latest Ref Rng & Units 11/18/2022   12:33 PM 03/23/2022    8:09 AM 10/05/2015    8:52 PM  CBC  WBC 4.0 - 10.5 K/uL 6.9  8.8  5.4   Hemoglobin 12.0 - 15.0 g/dL 95.6  21.3  08.6   Hematocrit 36.0 - 46.0 % 38.0  40.5  39.0   Platelets 150 - 400 K/uL 176  182  162         Latest Ref Rng & Units 11/18/2022   12:33 PM 04/11/2022    9:17 AM 03/25/2022    8:01 AM  CMP  Glucose 70 - 99 mg/dL 578  469    BUN 8 - 23 mg/dL 14  9    Creatinine 6.29 - 1.00 mg/dL 5.28  4.13    Sodium 244 - 145 mmol/L 140  140    Potassium 3.5 - 5.1 mmol/L 3.6  3.8    Chloride 98 - 111 mmol/L 104  104    CO2 22 - 32 mmol/L  26  25    Calcium 8.9 - 10.3 mg/dL 01.0  9.0    Total Protein 6.5 - 8.1 g/dL 6.9     Total Bilirubin 0.3 - 1.2 mg/dL 0.6     Alkaline Phos 38 - 126 U/L 79     AST 15 - 41 U/L 18     ALT 0 - 44 U/L 16   14        RADIOGRAPHIC STUDIES (from last 24 hours if applicable) I have personally reviewed the radiological images as listed and agreed with the findings in the report. VAS [place] LOWER EXTREMITY VENOUS (DVT)  Result Date: 12/02/2022  Lower Venous DVT Study Patient Name:  Lauren [name]  Date of Exam:   12/02/2022 Medical Rec #: [id number]         Accession #:    [id number] Date of Birth: March 05, 1955        Patient Gender: F Patient Age:   67 years Exam Location:  [facility] Procedure:      VAS [place] LOWER EXTREMITY VENOUS (DVT) Referring Phys: Rachel Moulds --------------------------------------------------------------------------------  Indications: Pain.  Risk Factors: Cancer History of breast cancer. Performing Technologist: Thereasa Parkin

## 2022-12-02 NOTE — Telephone Encounter (Signed)
Spoke with patient confirming upcoming appointments  

## 2022-12-06 ENCOUNTER — Telehealth: Payer: Self-pay | Admitting: *Deleted

## 2022-12-06 NOTE — Telephone Encounter (Signed)
This RN returned call to [facility] per VM- verified pt's breast cancer stage as 1A - and was informed that due to stage test would be a 1 time process and not a surveillance.

## 2022-12-08 ENCOUNTER — Inpatient Hospital Stay: Payer: BC Managed Care – PPO | Admitting: Pharmacist

## 2022-12-08 ENCOUNTER — Inpatient Hospital Stay: Payer: BC Managed Care – PPO

## 2022-12-09 ENCOUNTER — Ambulatory Visit: Payer: BC Managed Care – PPO | Attending: Surgery

## 2022-12-09 DIAGNOSIS — C50412 Malignant neoplasm of upper-outer quadrant of left female breast: Secondary | ICD-10-CM | POA: Insufficient documentation

## 2022-12-09 DIAGNOSIS — Z170 Estrogen receptor positive status [ER+]: Secondary | ICD-10-CM | POA: Insufficient documentation

## 2022-12-09 NOTE — Therapy (Signed)
OUTPATIENT PHYSICAL THERAPY SOZO SCREENING NOTE   Patient Name: Lauren [name] MRN: [id number] DOB:07-Aug-1955, 67 y.o., female Today's Date: 12/09/2022  PCP: Camie Patience, FNP REFERRING PROVIDER: Abigail Miyamoto, MD   PT End of Session - 12/09/22 1203     Visit Number 1    Number of Visits 1   screen only   PT Start Time 0855    PT Stop Time 0910    PT Time Calculation (min) 15 min    Activity Tolerance Patient tolerated treatment well    Behavior During Therapy [facility] for tasks assessed/performed             Past Medical History:  Diagnosis Date   Breast cancer (HCC)    Diabetes mellitus without complication (HCC)    Hyperlipidemia    Hypertension    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   not current problem, no hematologist   Past Surgical History:  Procedure Laterality Date   ABDOMINAL HYSTERECTOMY  11-18-1998   partial   BREAST BIOPSY Left 03/15/2022   [place] LT BREAST BX W LOC DEV 1ST LESION IMG BX SPEC US GUIDE 03/15/2022 [facility]   BREAST BIOPSY Left 03/15/2022   [place] LT BREAST BX W LOC DEV EA ADD LESION IMG BX SPEC US GUIDE 03/15/2022 [facility]   CHOLECYSTECTOMY  08/02/2011   Procedure: LAPAROSCOPIC CHOLECYSTECTOMY WITH INTRAOPERATIVE CHOLANGIOGRAM;  Surgeon: Valarie Merino, MD;  Location: [facility];  Service: General;  Laterality: N/A;   LAPAROSCOPIC GASTRIC BANDING  05/19/2008   LUMBAR DISC SURGERY  03/21/1996   MASTECTOMY W/ SENTINEL NODE BIOPSY Left 04/14/2022   Procedure: LEFT MASTECTOMY WITH SENTINEL NODE BIOPSY;  Surgeon: Abigail Miyamoto, MD;  Location: [facility];  Service: General;  Laterality: Left;   THYROIDECTOMY N/A 09/08/2015   Procedure: TOTAL THYROIDECTOMY;  Surgeon: Darnell Level, MD;  Location: [facility];  Service: General;  Laterality: N/A;   TONSILLECTOMY     Patient Active Problem List   Diagnosis Date Noted   Vitamin B deficiency 11/08/2022   Type 2 diabetes mellitus without complication,  without long-term current use of insulin (HCC) 11/08/2022   Pre-diabetes 11/08/2022   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   Memory impairment 11/08/2022   Hypomagnesemia 11/08/2022   Hypertension 11/08/2022   Hyperlipidemia 11/08/2022   History of papillary adenocarcinoma of thyroid 11/08/2022   History of ITP 11/08/2022   History of COVID-19 11/08/2022   History of adenomatous polyp of colon 11/08/2022   GERD (gastroesophageal reflux disease) 11/08/2022   Cholelithiasis 11/08/2022   BMI 40.0-44.9, adult (HCC) 11/08/2022   Aortic atherosclerosis (HCC) 11/08/2022   Genetic testing 03/31/2022   Malignant neoplasm of upper-outer quadrant of left breast in female, estrogen receptor positive (HCC) 03/21/2022   Disorder of tendon 02/17/2017   Toxic multinodular goiter 09/08/2015   Hyperthyroidism 09/04/2015   Multinodular goiter 04/29/2011   Chronic cholecystitis 04/29/2011   Lapband APS April 2010 03/18/2011   GIST (gastrointestinal stromal tumor), 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  PAIN:  Are you having pain? No    Patient was assessed today using the SOZO machine to determine the lymphedema index score. This was compared to her baseline score. It  was determined that she is NOT within the recommended range when compared to her baseline and so she was sent to [facility] to be measured for a garment as her measurements were outside the range of the sleeves that we carry here. It is recommended she return in 1 month to be reassessed. If she continues to measure outside the recommended range, physical therapy treatment will be recommended at that time and  a referral requested.    L-DEX FLOWSHEETS - 12/09/22 1200       L-DEX LYMPHEDEMA SCREENING   Measurement Type Unilateral    L-DEX MEASUREMENT EXTREMITY Upper Extremity    POSITION  Standing    DOMINANT SIDE Left    At Risk Side Left    BASELINE SCORE (UNILATERAL) -3.2    L-DEX SCORE (UNILATERAL) 6    VALUE CHANGE (UNILAT) 9.2            PLEASE KEEP YOUR COMPRESSION GARMENT ON DURING THE DAY TO GET THE BEST SWELLING REDUCTION. HERE ARE SOME ADDITIONAL TIPS:  Do not sleep in your garment. If you have pain or notice swelling in your hand or at the top of your shoulder, call your therapist. This may be a sign that you need a different garment. 3.  Take good care of your garment so it lasts longer: Follow washing instructions on your garment label or box. Wash periodically using a mild detergent in warm water.  Do not use fabric softener or bleach.  Place garment in a mesh lingerie bag and use the gentle cycle of the washing machine or hand wash. Tumble dry low or lay flat to dry. TAKE CARE OF YOUR SKIN Apply a low pH moisturizing lotion to your skin daily Avoid scratching your skin Treat skin irritations quickly  Know the 5 warning signs of infection: redness, pain, warmth to touch, fever and increased swelling.  Call your physician immediately if you notice any of these signs of a possible infection.   Waynette Buttery, PT 12/09/2022, 12:06 PM

## 2022-12-09 NOTE — Patient Instructions (Signed)
PLEASE KEEP YOUR COMPRESSION GARMENT ON DURING THE DAY TO GET THE BEST SWELLING REDUCTION. HERE ARE SOME ADDITIONAL TIPS: Do not sleep in your garment. If you have pain or notice swelling in your hand or at the top of your shoulder, call your therapist. This may be a sign that you need a different garment. 3.  Take good care of your garment so it lasts longer: Follow washing instructions on your garment label or box. Wash periodically using a mild detergent in warm water.  Do not use fabric softener or bleach.  Place garment in a mesh lingerie bag and use the gentle cycle of the washing machine or hand wash. Tumble dry low or lay flat to dry. TAKE CARE OF YOUR SKIN Apply a low pH moisturizing lotion to your skin daily Avoid scratching your skin Treat skin irritations quickly  Know the 5 warning signs of infection: redness, pain, warmth to touch, fever and increased swelling.  Call your physician immediately if you notice any of these signs of a possible infection.  

## 2022-12-10 DIAGNOSIS — C50412 Malignant neoplasm of upper-outer quadrant of left female breast: Secondary | ICD-10-CM | POA: Diagnosis not present

## 2022-12-10 DIAGNOSIS — Z170 Estrogen receptor positive status [ER+]: Secondary | ICD-10-CM | POA: Diagnosis not present

## 2022-12-13 ENCOUNTER — Encounter: Payer: Self-pay | Admitting: Hematology and Oncology

## 2022-12-14 LAB — GUARDANT REVEAL

## 2022-12-15 ENCOUNTER — Encounter: Payer: Self-pay | Admitting: Hematology and Oncology

## 2023-01-02 ENCOUNTER — Inpatient Hospital Stay: Payer: BC Managed Care – PPO

## 2023-01-02 ENCOUNTER — Other Ambulatory Visit: Payer: Self-pay

## 2023-01-02 ENCOUNTER — Inpatient Hospital Stay: Payer: BC Managed Care – PPO | Attending: Hematology and Oncology | Admitting: Hematology and Oncology

## 2023-01-02 VITALS — BP 147/73 | HR 76 | Temp 98.2°F | Resp 16 | Wt 213.8 lb

## 2023-01-02 DIAGNOSIS — C50412 Malignant neoplasm of upper-outer quadrant of left female breast: Secondary | ICD-10-CM | POA: Diagnosis not present

## 2023-01-02 DIAGNOSIS — Z79811 Long term (current) use of aromatase inhibitors: Secondary | ICD-10-CM | POA: Diagnosis not present

## 2023-01-02 DIAGNOSIS — Z170 Estrogen receptor positive status [ER+]: Secondary | ICD-10-CM | POA: Diagnosis not present

## 2023-01-02 LAB — CMP (CANCER CENTER ONLY)
ALT: 22 U/L (ref 0–44)
AST: 19 U/L (ref 15–41)
Albumin: 4 g/dL (ref 3.5–5.0)
Alkaline Phosphatase: 50 U/L (ref 38–126)
Anion gap: 6 (ref 5–15)
BUN: 20 mg/dL (ref 8–23)
CO2: 32 mmol/L (ref 22–32)
Calcium: 10 mg/dL (ref 8.9–10.3)
Chloride: 102 mmol/L (ref 98–111)
Creatinine: 1.31 mg/dL — ABNORMAL HIGH (ref 0.44–1.00)
GFR, Estimated: 45 mL/min — ABNORMAL LOW (ref >60)
Glucose, Bld: 193 mg/dL — ABNORMAL HIGH (ref 70–99)
Potassium: 3.7 mmol/L (ref 3.5–5.1)
Sodium: 140 mmol/L (ref 135–145)
Total Bilirubin: 0.8 mg/dL (ref <1.2)
Total Protein: 6.6 g/dL (ref 6.5–8.1)

## 2023-01-02 LAB — CBC WITH DIFFERENTIAL (CANCER CENTER ONLY)
Abs Immature Granulocytes: 0.02 10*3/uL (ref 0.00–0.07)
Basophils Absolute: 0.1 10*3/uL (ref 0.0–0.1)
Basophils Relative: 1 %
Eosinophils Absolute: 0.2 10*3/uL (ref 0.0–0.5)
Eosinophils Relative: 3 %
HCT: 36.9 % (ref 36.0–46.0)
Hemoglobin: 12.8 g/dL (ref 12.0–15.0)
Immature Granulocytes: 0 %
Lymphocytes Relative: 25 %
Lymphs Abs: 1.2 10*3/uL (ref 0.7–4.0)
MCH: 31.3 pg (ref 26.0–34.0)
MCHC: 34.7 g/dL (ref 30.0–36.0)
MCV: 90.2 fL (ref 80.0–100.0)
Monocytes Absolute: 0.2 10*3/uL (ref 0.1–1.0)
Monocytes Relative: 5 %
Neutro Abs: 3.1 10*3/uL (ref 1.7–7.7)
Neutrophils Relative %: 66 %
Platelet Count: 151 10*3/uL (ref 150–400)
RBC: 4.09 MIL/uL (ref 3.87–5.11)
RDW: 12.4 % (ref 11.5–15.5)
WBC Count: 4.7 10*3/uL (ref 4.0–10.5)
nRBC: 0 % (ref 0.0–0.2)

## 2023-01-02 MED ORDER — ABEMACICLIB 50 MG PO TABS: 50.0000 mg | ORAL_TABLET | Freq: Two times a day (BID) | ORAL | 1 refills | Status: DC

## 2023-01-02 NOTE — Progress Notes (Signed)
[facility] CONSULT NOTE  Patient Care Team: Camie Patience, FNP as PCP - General (Family Medicine) Meriam Sprague, MD (Inactive) as PCP - Cardiology (Cardiology) Abigail Miyamoto, MD as Consulting Physician (General Surgery) Rachel Moulds, MD as Consulting Physician (Hematology and Oncology) Dorothy Puffer, MD as Consulting Physician (Radiation Oncology)  CHIEF COMPLAINTS/PURPOSE OF CONSULTATION:  Newly diagnosed breast cancer  HISTORY OF PRESENTING ILLNESS:  Lauren [name] 67 y.o. female is here because of recent diagnosis of left breast cancer.  I reviewed her records extensively and collaborated the history with the patient.  SUMMARY OF ONCOLOGIC HISTORY: Oncology History  Malignant neoplasm of upper-outer quadrant of left breast in female, estrogen receptor positive (HCC)  03/04/2022 Mammogram   Screening mammogram showed calcs and possible distortion in the left breast. Diag mammogram showed suspicious calcs and non mass finding in the 2 0 clock location of the left breast warranting further tissue diagnosis. No left axillary adenopathy   03/10/2022 Breast US   Suspicious calcifications and non mass enhancement measuring approximately 4.9 cm in largest dimension.   03/23/2022 Cancer Staging   Staging form: Breast, AJCC 8th Edition - Pathologic: Stage IA (pT1b, pN1a, cM0, G2, ER+, PR+, HER2-) - Signed by Rachel Moulds, MD on 05/06/2022 Histologic grading system: 3 grade system   03/25/2022 Pathology Results   Pathology showed grade 2 IDC, ER positive, PR positive, her 2 neg, Ki 67 30%   03/31/2022 Genetic Testing   Negative. The Multi-Cancer + RNA Panel offered by [facility] includes sequencing and/or deletion/duplication analysis of the following 70 genes:  AIP*, ALK, APC*, ATM*, AXIN2*, BAP1*, BARD1*, BLM*, BMPR1A*, BRCA1*, BRCA2*, BRIP1*, CDC73*, CDH1*, CDK4, CDKN1B*, CDKN2A, CHEK2*, CTNNA1*, DICER1*, EPCAM (del/dup only), EGFR, FH*, FLCN*, GREM1 (promoter  dup only), HOXB13, KIT, LZTR1, MAX*, MBD4, MEN1*, MET, MITF, MLH1*, MSH2*, MSH3*, MSH6*, MUTYH*, NF1*, NF2*, NTHL1*, PALB2*, PDGFRA, PMS2*, POLD1*, POLE*, POT1*, PRKAR1A*, PTCH1*, PTEN*, RAD51C*, RAD51D*, RB1*, RET, SDHA* (sequencing only), SDHAF2*, SDHB*, SDHC*, SDHD*, SMAD4*, SMARCA4*, SMARCB1*, SMARCE1*, STK11*, SUFU*, TMEM127*, TP53*, TSC1*, TSC2*, VHL*. RNA analysis is performed for * genes.   04/14/2022 Surgery   Left breast mastectomy: IDC, grade 2, 0.8cm, 3/17 LN positive for macrometastases, margins negative.    05/09/2022 Oncotype testing   10/12%   06/06/2022 - 07/21/2022 Radiation Therapy   Plan Name: CW_L_BO_BH Site: Chest Wall, Left Technique: 3D Mode: Photon Dose Per Fraction: 1.8 Gy Prescribed Dose (Delivered / Prescribed): 25.2 Gy / 25.2 Gy Prescribed Fxs (Delivered / Prescribed): 14 / 14   Plan Name: CW_PAB_SCV_BH Site: Chest Wall, Left Technique: 3D Mode: Photon Dose Per Fraction: 1.8 Gy Prescribed Dose (Delivered / Prescribed): 50.4 Gy / 50.4 Gy Prescribed Fxs (Delivered / Prescribed): 28 / 28   Plan Name: CW_L_Bst_BO Site: Chest Wall, Left Technique: Electron Mode: Electron Dose Per Fraction: 2 Gy Prescribed Dose (Delivered / Prescribed): 10 Gy / 10 Gy Prescribed Fxs (Delivered / Prescribed): 5 / 5   Plan Name: CW_L_BH Site: Chest Wall, Left Technique: 3D Mode: Photon Dose Per Fraction: 1.8 Gy Prescribed Dose (Delivered / Prescribed): 25.2 Gy / 25.2 Gy Prescribed Fxs (Delivered / Prescribed): 14 / 14   10/04/2022 -  Anti-estrogen oral therapy   Letrozole    She is now status post left mastectomy, extensive DCIS intermediate nuclear grade, invasive moderately differentiated adenocarcinoma grade 2, invasive tumor measures 8 mm in greatest dimension.  Margins free.  Left axillary lymph nodes regional resection which showed 3 of 17 lymph nodes with metastatic carcinoma showing marked fatty  replacement.  Negative for extracapsular extension.  Prior prognostic  showed ER +95% strong staining PR 90% moderate to strong staining HER2 negative 1+ Ki-67 of 30%. She completed adjuvant radiation on 07/21/2022. She is now on adjuvant verzenio and letrozole.  Discussed the use of AI scribe software for clinical note transcription with the patient, who gave verbal consent to proceed.  History of Present Illness    The patient, on Verzenio and letrozole for breast cancer, presents with diarrhea and nausea. The diarrhea, which is severe enough to cause incontinence, typically starts around 11-12pm and lasts until 3-4pm. She takes four loperamide tablets which eventually control the diarrhea. She also experiences persistent nausea and a general feeling of malaise in her stomach. She has taken Zofran once for the nausea, but prefers not to take it unless absolutely necessary. She has found that eating two ginger snaps helps a little with the nausea. The patient also reports feeling tired, but is unsure if this is due to the Verzenio, letrozole, or metformin. 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   not current problem, no hematologist    SURGICAL HISTORY: Past Surgical History:  Procedure Laterality Date   ABDOMINAL HYSTERECTOMY  11-18-1998   partial   BREAST BIOPSY Left 03/15/2022   [place] LT BREAST BX W LOC DEV 1ST LESION IMG BX SPEC US GUIDE 03/15/2022 [facility]   BREAST BIOPSY Left 03/15/2022   [place] LT BREAST BX W LOC DEV EA ADD LESION IMG BX SPEC US GUIDE 03/15/2022 [facility]   CHOLECYSTECTOMY  08/02/2011   Procedure: LAPAROSCOPIC CHOLECYSTECTOMY WITH INTRAOPERATIVE CHOLANGIOGRAM;  Surgeon: Valarie Merino, MD;  Location: [facility];  Service: General;  Laterality: N/A;   LAPAROSCOPIC GASTRIC BANDING  05/19/2008    LUMBAR DISC SURGERY  03/21/1996   MASTECTOMY W/ SENTINEL NODE BIOPSY Left 04/14/2022   Procedure: LEFT MASTECTOMY WITH SENTINEL NODE BIOPSY;  Surgeon: Abigail Miyamoto, MD;  Location: [facility];  Service: General;  Laterality: Left;   THYROIDECTOMY N/A 09/08/2015   Procedure: TOTAL THYROIDECTOMY;  Surgeon: Darnell Level, MD;  Location: [facility];  Service: General;  Laterality: N/A;   TONSILLECTOMY      SOCIAL HISTORY: Social History   Socioeconomic History   Marital status: Married    Spouse name: Not on file   Number of children: Not on file   Years of education: Not on file   Highest education level: Not on file  Occupational History   Not on file  Tobacco Use   Smoking status: Never   Smokeless tobacco: Never  Substance and Sexual Activity   Alcohol use: No   Drug use: No   Sexual activity: Yes  Other Topics Concern   Not on file  Social History Narrative   Not on file   Social Determinants of Health   Financial Resource Strain: Low Risk  (03/23/2022)   Overall Financial Resource Strain (CARDIA)    Difficulty of Paying Living Expenses: Not hard at all  Food Insecurity: No Food Insecurity (03/23/2022)   Hunger Vital Sign    Worried About Running Out of Food in the Last Year: Never true    Ran Out of Food in the Last Year: Never true  Transportation Needs: No Transportation Needs (  03/23/2022)   PRAPARE - Administrator, Civil Service (Medical): No    Lack of Transportation (Non-Medical): No  Physical Activity: Not on file  Stress: Not on file  Social Connections: Not on file  Intimate Partner Violence: Not on file    FAMILY HISTORY: Family History  Problem Relation Age of Onset   Colon polyps Mother    Heart disease Mother    Diabetes Father    Heart disease Father    Breast cancer Maternal Aunt 50 - 69   Ovarian cancer Paternal Aunt    Bladder Cancer Paternal Uncle        paternal half-uncle   Breast cancer Cousin 43 - 76     ALLERGIES:  has No Known Allergies.  MEDICATIONS:  Current Outpatient Medications  Medication Sig Dispense Refill   abemaciclib (VERZENIO) 50 MG tablet Take 1 tablet (50 mg total) by mouth 2 (two) times daily. 56 tablet 1   ondansetron (ZOFRAN) 8 MG tablet Take 1 tablet (8 mg total) by mouth every 8 (eight) hours as needed for nausea or vomiting. 30 tablet 2   ACCU-CHEK GUIDE test strip check sugar 3x daily In Vitro three times daily for 90 days     Accu-Chek Softclix Lancets lancets USE 1 TO CHECK GLUCOSE THREE TIMES DAILY     aspirin 81 MG tablet Take 81 mg by mouth daily.      benazepril-hydrochlorthiazide (LOTENSIN HCT) 20-25 MG tablet Take 1 tablet by mouth daily.     Blood Glucose Monitoring Suppl (ACCU-CHEK GUIDE) w/Device KIT check glucose daily and as needed for 365 days     Cyanocobalamin (B-12) 500 MCG SUBL Place 1 tablet under the tongue daily.     docusate sodium (COLACE) 100 MG capsule Take 100 mg by mouth daily.     EUTHYROX 175 MCG tablet Take 175 mcg by mouth daily before breakfast.     letrozole (FEMARA) 2.5 MG tablet Take 1 tablet by mouth once daily 90 tablet 3   metFORMIN (GLUCOPHAGE) 500 MG tablet 500 mg. 1 tablet with breakfast, 1 tablet with supper     metoprolol succinate (TOPROL XL) 50 MG 24 hr tablet Take 1 tablet (50 mg total) by mouth daily. Take with or immediately following a meal. 90 tablet 2   nystatin cream (MYCOSTATIN) 1 application Externally Twice a day as needed for 30 days     rosuvastatin (CRESTOR) 40 MG tablet Take 1 tablet (40 mg total) by mouth daily. 90 tablet 2   terbinafine (LAMISIL) 250 MG tablet  (Patient not taking: Reported on 11/08/2022)     triamcinolone (KENALOG) 0.025 % cream 1 application Externally Once a day as neeed     VITAMIN D3 1.25 MG (50000 UT) capsule Take 50,000 Units by mouth once a week.     No current facility-administered medications for this visit.   REVIEW OF SYSTEMS:   Constitutional: Denies fevers, chills or  abnormal night sweats Eyes: Denies blurriness of vision, double vision or watery eyes Ears, nose, mouth, throat, and face: Denies mucositis or sore throat Respiratory: Denies cough, dyspnea or wheezes Cardiovascular: Denies palpitation, chest discomfort or lower extremity swelling Gastrointestinal:  Denies nausea, heartburn or change in bowel habits Skin: Denies abnormal skin rashes Lymphatics: Denies new lymphadenopathy or easy bruising Neurological:Denies numbness, tingling or new weaknesses Behavioral/Psych: Mood is stable, no new changes  Breast: Denies any palpable lumps or discharge All other systems were reviewed with the patient and are negative.  PHYSICAL  EXAMINATION: ECOG PERFORMANCE STATUS: 0 - Asymptomatic  Vitals:   01/02/23 1034  BP: (!) 147/73  Pulse: 76  Resp: 16  Temp: 98.2 F (36.8 C)  SpO2: 99%    Filed Weights   01/02/23 1034  Weight: 213 lb 12.8 oz (97 kg)    Physical Exam Constitutional:      Appearance: Normal appearance.  Cardiovascular:     Rate and Rhythm: Normal rate and regular rhythm.     Pulses: Normal pulses.     Heart sounds: Normal heart sounds.  Pulmonary:     Effort: Pulmonary effort is normal.     Breath sounds: Normal breath sounds.  Chest:     Comments: She is status post left mastectomy, right breast normal to inspection and palpation. Abdominal:     General: Abdomen is flat. Bowel sounds are normal. There is no distension.     Palpations: There is no mass.  Musculoskeletal:     Cervical back: Normal range of motion. No rigidity.  Lymphadenopathy:     Cervical: No cervical adenopathy.  Skin:    General: Skin is warm and dry.  Neurological:     General: No focal deficit present.     Mental Status: She is alert.  Psychiatric:        Mood and Affect: Mood normal.      LABORATORY DATA:  I have reviewed the data as listed Lab Results  Component Value Date   WBC 4.7 01/02/2023   HGB 12.8 01/02/2023   HCT 36.9  01/02/2023   MCV 90.2 01/02/2023   PLT 151 01/02/2023   Lab Results  Component Value Date   NA 140 01/02/2023   K 3.7 01/02/2023   CL 102 01/02/2023   CO2 32 01/02/2023    RADIOGRAPHIC STUDIES: I have personally reviewed the radiological reports and agreed with the findings in the report.  ASSESSMENT AND PLAN:   Malignant neoplasm of upper-outer quadrant of left breast in female, estrogen receptor positive (HCC) This is a very pleasant 67 yr old female patient with PMH significant for HTN, obesity, hyperlipidemia referred to breast MDC for recommendations.  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  We sent for Oncotype testing but we are waiting for the results.  If Oncotype suggests benefit from addition of chemotherapy, we will consider chemotherapy.  If there is no benefit from chemo, she will proceed with radiation followed by adjuvant antiestrogen therapy and CDK 4 6 inhibition. Oncotype of 10, no role for chemo She completed radiation on 07/21/2022 She is on Verzenio and anastrozole, initially we did not consider Verzenio because of her intermediate grade and small size tumor but given 3 out of 17 lymph nodes positive, she wanted to give it a try.  Breast Cancer Patient has been on Verzenio 100mg  and Letrozole for almost a month. Experiencing significant gastrointestinal side effects including diarrhea and nausea. Discussed the option of reducing the dose of Verzenio or discontinuing it altogether. Patient's original pathology showed 3 positive lymph nodes, but not high grade or large tumor. -Reduce Verzenio to 50mg  daily for 30 days and monitor for tolerance. -Continue Letrozole as prescribed. -Consider patient for Ribociclib trial when it opens, if patient is eligible and willing and can't tolerate verzenio  General Health  Maintenance -Order mammogram for right breast in January. -Continue Vitamin D and B12 supplementation  as prescribed. -Follow-up appointment in 4 weeks with labs.  Total time spent: 30 minutes including history, physical exam, review of records, counseling and coordination of care, breast MDC discussion. All questions were answered. The patient knows to call the clinic with any problems, questions or concerns.    Rachel Moulds, MD 01/02/23

## 2023-01-02 NOTE — Assessment & Plan Note (Addendum)
This is a very pleasant 67 yr old female patient with PMH significant for HTN, obesity, hyperlipidemia referred to breast MDC for recommendations.  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  We sent for Oncotype testing but we are waiting for the results.  If Oncotype suggests benefit from addition of chemotherapy, we will consider chemotherapy.  If there is no benefit from chemo, she will proceed with radiation followed by adjuvant antiestrogen therapy and CDK 4 6 inhibition. Oncotype of 10, no role for chemo She completed radiation on 07/21/2022 She is on Verzenio and anastrozole, initially we did not consider Verzenio because of her intermediate grade and small size tumor but given 3 out of 17 lymph nodes positive, she wanted to give it a try.  Breast Cancer Patient has been on Verzenio 100mg  and Letrozole for almost a month. Experiencing significant gastrointestinal side effects including diarrhea and nausea. Discussed the option of reducing the dose of Verzenio or discontinuing it altogether. Patient's original pathology showed 3 positive lymph nodes, but not high grade or large tumor. -We have once again discussed that this is a soft indication to be added with letrozole.  She however wants to try it at a lower dose. -Reduce Verzenio to 50mg  daily for 30 days and monitor for tolerance. -Continue Letrozole as prescribed. -Consider patient for Ribociclib trial when it opens, if patient is eligible and willing and can't tolerate verzenio  General Health Maintenance -Order mammogram for right breast in January. -Continue Vitamin D and B12 supplementation as prescribed. -Follow-up appointment in 4 weeks with labs.

## 2023-01-02 NOTE — Progress Notes (Signed)
[facility] CONSULT NOTE  Patient Care Team: Camie Patience, FNP as PCP - General (Family Medicine) Meriam Sprague, MD (Inactive) as PCP - Cardiology (Cardiology) Abigail Miyamoto, MD as Consulting Physician (General Surgery) Rachel Moulds, MD as Consulting Physician (Hematology and Oncology) Dorothy Puffer, MD as Consulting Physician (Radiation Oncology)  CHIEF COMPLAINTS/PURPOSE OF CONSULTATION:  Newly diagnosed breast cancer  HISTORY OF PRESENTING ILLNESS:  Lauren [name] 67 y.o. female is here because of recent diagnosis of left breast cancer.  I reviewed her records extensively and collaborated the history with the patient.  SUMMARY OF ONCOLOGIC HISTORY: Oncology History  Malignant neoplasm of upper-outer quadrant of left breast in female, estrogen receptor positive (HCC)  03/04/2022 Mammogram   Screening mammogram showed calcs and possible distortion in the left breast. Diag mammogram showed suspicious calcs and non mass finding in the 2 0 clock location of the left breast warranting further tissue diagnosis. No left axillary adenopathy   03/10/2022 Breast US   Suspicious calcifications and non mass enhancement measuring approximately 4.9 cm in largest dimension.   03/23/2022 Cancer Staging   Staging form: Breast, AJCC 8th Edition - Pathologic: Stage IA (pT1b, pN1a, cM0, G2, ER+, PR+, HER2-) - Signed by Rachel Moulds, MD on 05/06/2022 Histologic grading system: 3 grade system   03/25/2022 Pathology Results   Pathology showed grade 2 IDC, ER positive, PR positive, her 2 neg, Ki 67 30%   03/31/2022 Genetic Testing   Negative. The Multi-Cancer + RNA Panel offered by [facility] includes sequencing and/or deletion/duplication analysis of the following 70 genes:  AIP*, ALK, APC*, ATM*, AXIN2*, BAP1*, BARD1*, BLM*, BMPR1A*, BRCA1*, BRCA2*, BRIP1*, CDC73*, CDH1*, CDK4, CDKN1B*, CDKN2A, CHEK2*, CTNNA1*, DICER1*, EPCAM (del/dup only), EGFR, FH*, FLCN*, GREM1 (promoter  dup only), HOXB13, KIT, LZTR1, MAX*, MBD4, MEN1*, MET, MITF, MLH1*, MSH2*, MSH3*, MSH6*, MUTYH*, NF1*, NF2*, NTHL1*, PALB2*, PDGFRA, PMS2*, POLD1*, POLE*, POT1*, PRKAR1A*, PTCH1*, PTEN*, RAD51C*, RAD51D*, RB1*, RET, SDHA* (sequencing only), SDHAF2*, SDHB*, SDHC*, SDHD*, SMAD4*, SMARCA4*, SMARCB1*, SMARCE1*, STK11*, SUFU*, TMEM127*, TP53*, TSC1*, TSC2*, VHL*. RNA analysis is performed for * genes.   04/14/2022 Surgery   Left breast mastectomy: IDC, grade 2, 0.8cm, 3/17 LN positive for macrometastases, margins negative.    05/09/2022 Oncotype testing   10/12%   06/06/2022 - 07/21/2022 Radiation Therapy   Plan Name: CW_L_BO_BH Site: Chest Wall, Left Technique: 3D Mode: Photon Dose Per Fraction: 1.8 Gy Prescribed Dose (Delivered / Prescribed): 25.2 Gy / 25.2 Gy Prescribed Fxs (Delivered / Prescribed): 14 / 14   Plan Name: CW_PAB_SCV_BH Site: Chest Wall, Left Technique: 3D Mode: Photon Dose Per Fraction: 1.8 Gy Prescribed Dose (Delivered / Prescribed): 50.4 Gy / 50.4 Gy Prescribed Fxs (Delivered / Prescribed): 28 / 28   Plan Name: CW_L_Bst_BO Site: Chest Wall, Left Technique: Electron Mode: Electron Dose Per Fraction: 2 Gy Prescribed Dose (Delivered / Prescribed): 10 Gy / 10 Gy Prescribed Fxs (Delivered / Prescribed): 5 / 5   Plan Name: CW_L_BH Site: Chest Wall, Left Technique: 3D Mode: Photon Dose Per Fraction: 1.8 Gy Prescribed Dose (Delivered / Prescribed): 25.2 Gy / 25.2 Gy Prescribed Fxs (Delivered / Prescribed): 14 / 14   10/04/2022 -  Anti-estrogen oral therapy   Letrozole    She is now status post left mastectomy, extensive DCIS intermediate nuclear grade, invasive moderately differentiated adenocarcinoma grade 2, invasive tumor measures 8 mm in greatest dimension.  Margins free.  Left axillary lymph nodes regional resection which showed 3 of 17 lymph nodes with metastatic carcinoma showing marked fatty  replacement.  Negative for extracapsular extension.  Prior prognostic  showed ER +95% strong staining PR 90% moderate to strong staining HER2 negative 1+ Ki-67 of 30%. She completed adjuvant radiation on 07/21/2022.  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   not current problem, no hematologist    SURGICAL HISTORY: Past Surgical History:  Procedure Laterality Date   ABDOMINAL HYSTERECTOMY  11-18-1998   partial   BREAST BIOPSY Left 03/15/2022   [place] LT BREAST BX W LOC DEV 1ST LESION IMG BX SPEC US GUIDE 03/15/2022 [facility]   BREAST BIOPSY Left 03/15/2022   [place] LT BREAST BX W LOC DEV EA ADD LESION IMG BX SPEC US GUIDE 03/15/2022 [facility]   CHOLECYSTECTOMY  08/02/2011   Procedure: LAPAROSCOPIC CHOLECYSTECTOMY WITH INTRAOPERATIVE CHOLANGIOGRAM;  Surgeon: Valarie Merino, MD;  Location: [facility];  Service: General;  Laterality: N/A;   LAPAROSCOPIC GASTRIC BANDING  05/19/2008   LUMBAR DISC SURGERY  03/21/1996   MASTECTOMY W/ SENTINEL NODE BIOPSY Left 04/14/2022   Procedure: LEFT MASTECTOMY WITH SENTINEL NODE BIOPSY;  Surgeon: Abigail Miyamoto, MD;  Location: [facility];  Service: General;  Laterality: Left;   THYROIDECTOMY N/A 09/08/2015   Procedure: TOTAL THYROIDECTOMY;  Surgeon: Darnell Level, MD;  Location: [facility];  Service: General;  Laterality: N/A;   TONSILLECTOMY      SOCIAL HISTORY: Social History   Socioeconomic History   Marital status: Married    Spouse name: Not on file   Number of children: Not on file   Years of education: Not on file   Highest education level: Not on file  Occupational History   Not on file  Tobacco Use   Smoking status: Never   Smokeless tobacco: Never  Substance and Sexual Activity   Alcohol use: No   Drug use: No   Sexual activity: Yes  Other Topics Concern   Not on file  Social History Narrative   Not on  file   Social Determinants of Health   Financial Resource Strain: Low Risk  (03/23/2022)   Overall Financial Resource Strain (CARDIA)    Difficulty of Paying Living Expenses: Not hard at all  Food Insecurity: No Food Insecurity (03/23/2022)   Hunger Vital Sign    Worried About Running Out of Food in the Last Year: Never true    Ran Out of Food in the Last Year: Never true  Transportation Needs: No Transportation Needs (03/23/2022)   PRAPARE - Administrator, Civil Service (Medical): No    Lack of Transportation (Non-Medical): No  Physical Activity: Not on file  Stress: Not on file  Social Connections: Not on file  Intimate Partner Violence: Not on file    FAMILY HISTORY: Family History  Problem Relation Age of Onset   Colon polyps Mother    Heart disease Mother    Diabetes Father    Heart disease Father    Breast cancer Maternal Aunt 79 - 69   Ovarian cancer Paternal Aunt    Bladder Cancer Paternal Uncle        paternal half-uncle   Breast cancer Cousin 44 - 14    ALLERGIES:  has No Known Allergies.  MEDICATIONS:  Current Outpatient Medications  Medication Sig Dispense Refill   ondansetron (ZOFRAN) 8 MG tablet Take 1 tablet (8  mg total) by mouth every 8 (eight) hours as needed for nausea or vomiting. 30 tablet 2   ACCU-CHEK GUIDE test strip check sugar 3x daily In Vitro three times daily for 90 days     Accu-Chek Softclix Lancets lancets USE 1 TO CHECK GLUCOSE THREE TIMES DAILY     aspirin 81 MG tablet Take 81 mg by mouth daily.      benazepril-hydrochlorthiazide (LOTENSIN HCT) 20-25 MG tablet Take 1 tablet by mouth daily.     Blood Glucose Monitoring Suppl (ACCU-CHEK GUIDE) w/Device KIT check glucose daily and as needed for 365 days     Cyanocobalamin (B-12) 500 MCG SUBL Place 1 tablet under the tongue daily.     docusate sodium (COLACE) 100 MG capsule Take 100 mg by mouth daily.     EUTHYROX 175 MCG tablet Take 175 mcg by mouth daily before breakfast.      letrozole (FEMARA) 2.5 MG tablet Take 1 tablet by mouth once daily 90 tablet 3   metFORMIN (GLUCOPHAGE) 500 MG tablet 500 mg. 1 tablet with breakfast, 1 tablet with supper     metoprolol succinate (TOPROL XL) 50 MG 24 hr tablet Take 1 tablet (50 mg total) by mouth daily. Take with or immediately following a meal. 90 tablet 2   nystatin cream (MYCOSTATIN) 1 application Externally Twice a day as needed for 30 days     rosuvastatin (CRESTOR) 40 MG tablet Take 1 tablet (40 mg total) by mouth daily. 90 tablet 2   terbinafine (LAMISIL) 250 MG tablet  (Patient not taking: Reported on 11/08/2022)     triamcinolone (KENALOG) 0.025 % cream 1 application Externally Once a day as neeed     VERZENIO 100 MG tablet TAKE 1 TABLET BY MOUTH 2 TIMES A DAY 56 tablet 0   VITAMIN D3 1.25 MG (50000 UT) capsule Take 50,000 Units by mouth once a week.     No current facility-administered medications for this visit.   REVIEW OF SYSTEMS:   Constitutional: Denies fevers, chills or abnormal night sweats Eyes: Denies blurriness of vision, double vision or watery eyes Ears, nose, mouth, throat, and face: Denies mucositis or sore throat Respiratory: Denies cough, dyspnea or wheezes Cardiovascular: Denies palpitation, chest discomfort or lower extremity swelling Gastrointestinal:  Denies nausea, heartburn or change in bowel habits Skin: Denies abnormal skin rashes Lymphatics: Denies new lymphadenopathy or easy bruising Neurological:Denies numbness, tingling or new weaknesses Behavioral/Psych: Mood is stable, no new changes  Breast: Denies any palpable lumps or discharge All other systems were reviewed with the patient and are negative.  PHYSICAL EXAMINATION: ECOG PERFORMANCE STATUS: 0 - Asymptomatic  Vitals:   01/02/23 1034  BP: (!) 147/73  Pulse: 76  Resp: 16  Temp: 98.2 F (36.8 C)  SpO2: 99%    Filed Weights   01/02/23 1034  Weight: 213 lb 12.8 oz (97 kg)    PE deferred in lieu of  counseling  LABORATORY DATA:  I have reviewed the data as listed Lab Results  Component Value Date   WBC 4.7 01/02/2023   HGB 12.8 01/02/2023   HCT 36.9 01/02/2023   MCV 90.2 01/02/2023   PLT 151 01/02/2023   Lab Results  Component Value Date   NA 140 01/02/2023   K 3.7 01/02/2023   CL 102 01/02/2023   CO2 32 01/02/2023    RADIOGRAPHIC STUDIES: I have personally reviewed the radiological reports and agreed with the findings in the report.  ASSESSMENT AND PLAN:   No problem-specific  Assessment & Plan notes found for this encounter.  Total time spent: 40 minutes including history, physical exam, review of records, counseling and coordination of care, breast MDC discussion. All questions were answered. The patient knows to call the clinic with any problems, questions or concerns.    Rachel Moulds, MD 01/02/23

## 2023-01-15 ENCOUNTER — Encounter: Payer: Self-pay | Admitting: Pharmacist

## 2023-01-16 ENCOUNTER — Inpatient Hospital Stay: Payer: BC Managed Care – PPO

## 2023-01-16 ENCOUNTER — Inpatient Hospital Stay: Payer: BC Managed Care – PPO | Admitting: Pharmacist

## 2023-01-16 NOTE — Progress Notes (Deleted)
err

## 2023-01-17 ENCOUNTER — Ambulatory Visit: Payer: BC Managed Care – PPO

## 2023-01-31 ENCOUNTER — Inpatient Hospital Stay (HOSPITAL_BASED_OUTPATIENT_CLINIC_OR_DEPARTMENT_OTHER): Payer: BC Managed Care – PPO | Admitting: Hematology and Oncology

## 2023-01-31 ENCOUNTER — Inpatient Hospital Stay: Payer: BC Managed Care – PPO | Attending: Hematology and Oncology

## 2023-01-31 ENCOUNTER — Telehealth: Payer: Self-pay | Admitting: Pharmacy Technician

## 2023-01-31 VITALS — BP 148/70 | HR 84 | Temp 97.9°F | Resp 17 | Wt 215.0 lb

## 2023-01-31 DIAGNOSIS — I10 Essential (primary) hypertension: Secondary | ICD-10-CM | POA: Insufficient documentation

## 2023-01-31 DIAGNOSIS — K5900 Constipation, unspecified: Secondary | ICD-10-CM | POA: Diagnosis not present

## 2023-01-31 DIAGNOSIS — Z79811 Long term (current) use of aromatase inhibitors: Secondary | ICD-10-CM | POA: Insufficient documentation

## 2023-01-31 DIAGNOSIS — C50412 Malignant neoplasm of upper-outer quadrant of left female breast: Secondary | ICD-10-CM

## 2023-01-31 DIAGNOSIS — Z923 Personal history of irradiation: Secondary | ICD-10-CM | POA: Diagnosis not present

## 2023-01-31 DIAGNOSIS — C773 Secondary and unspecified malignant neoplasm of axilla and upper limb lymph nodes: Secondary | ICD-10-CM | POA: Diagnosis not present

## 2023-01-31 DIAGNOSIS — Z170 Estrogen receptor positive status [ER+]: Secondary | ICD-10-CM | POA: Insufficient documentation

## 2023-01-31 LAB — CBC WITH DIFFERENTIAL (CANCER CENTER ONLY)
Abs Immature Granulocytes: 0.02 10*3/uL (ref 0.00–0.07)
Basophils Absolute: 0.1 10*3/uL (ref 0.0–0.1)
Basophils Relative: 1 %
Eosinophils Absolute: 0.1 10*3/uL (ref 0.0–0.5)
Eosinophils Relative: 3 %
HCT: 34 % — ABNORMAL LOW (ref 36.0–46.0)
Hemoglobin: 11.7 g/dL — ABNORMAL LOW (ref 12.0–15.0)
Immature Granulocytes: 0 %
Lymphocytes Relative: 19 %
Lymphs Abs: 0.9 10*3/uL (ref 0.7–4.0)
MCH: 31.2 pg (ref 26.0–34.0)
MCHC: 34.4 g/dL (ref 30.0–36.0)
MCV: 90.7 fL (ref 80.0–100.0)
Monocytes Absolute: 0.4 10*3/uL (ref 0.1–1.0)
Monocytes Relative: 8 %
Neutro Abs: 3.2 10*3/uL (ref 1.7–7.7)
Neutrophils Relative %: 69 %
Platelet Count: 161 10*3/uL (ref 150–400)
RBC: 3.75 MIL/uL — ABNORMAL LOW (ref 3.87–5.11)
RDW: 13.7 % (ref 11.5–15.5)
WBC Count: 4.7 10*3/uL (ref 4.0–10.5)
nRBC: 0 % (ref 0.0–0.2)

## 2023-01-31 LAB — CMP (CANCER CENTER ONLY)
ALT: 16 U/L (ref 0–44)
AST: 17 U/L (ref 15–41)
Albumin: 3.9 g/dL (ref 3.5–5.0)
Alkaline Phosphatase: 59 U/L (ref 38–126)
Anion gap: 9 (ref 5–15)
BUN: 16 mg/dL (ref 8–23)
CO2: 27 mmol/L (ref 22–32)
Calcium: 9.6 mg/dL (ref 8.9–10.3)
Chloride: 104 mmol/L (ref 98–111)
Creatinine: 1 mg/dL (ref 0.44–1.00)
GFR, Estimated: >60 mL/min (ref >60)
Glucose, Bld: 220 mg/dL — ABNORMAL HIGH (ref 70–99)
Potassium: 3.4 mmol/L — ABNORMAL LOW (ref 3.5–5.1)
Sodium: 140 mmol/L (ref 135–145)
Total Bilirubin: 0.6 mg/dL (ref <1.2)
Total Protein: 6.3 g/dL — ABNORMAL LOW (ref 6.5–8.1)

## 2023-01-31 NOTE — Progress Notes (Signed)
[facility] CONSULT NOTE  Patient Care Team: Camie Patience, FNP as PCP - General (Family Medicine) Meriam Sprague, MD (Inactive) as PCP - Cardiology (Cardiology) Abigail Miyamoto, MD as Consulting Physician (General Surgery) Rachel Moulds, MD as Consulting Physician (Hematology and Oncology) Dorothy Puffer, MD as Consulting Physician (Radiation Oncology)  CHIEF COMPLAINTS/PURPOSE OF CONSULTATION:  Newly diagnosed breast cancer  HISTORY OF PRESENTING ILLNESS:  Lauren [name] 67 y.o. female is here because of recent diagnosis of left breast cancer.  I reviewed her records extensively and collaborated the history with the patient.  SUMMARY OF ONCOLOGIC HISTORY: Oncology History  Malignant neoplasm of upper-outer quadrant of left breast in female, estrogen receptor positive (HCC)  03/04/2022 Mammogram   Screening mammogram showed calcs and possible distortion in the left breast. Diag mammogram showed suspicious calcs and non mass finding in the 2 0 clock location of the left breast warranting further tissue diagnosis. No left axillary adenopathy   03/10/2022 Breast US   Suspicious calcifications and non mass enhancement measuring approximately 4.9 cm in largest dimension.   03/23/2022 Cancer Staging   Staging form: Breast, AJCC 8th Edition - Pathologic: Stage IA (pT1b, pN1a, cM0, G2, ER+, PR+, HER2-) - Signed by Rachel Moulds, MD on 05/06/2022 Histologic grading system: 3 grade system   03/25/2022 Pathology Results   Pathology showed grade 2 IDC, ER positive, PR positive, her 2 neg, Ki 67 30%   03/31/2022 Genetic Testing   Negative. The Multi-Cancer + RNA Panel offered by [facility] includes sequencing and/or deletion/duplication analysis of the following 70 genes:  AIP*, ALK, APC*, ATM*, AXIN2*, BAP1*, BARD1*, BLM*, BMPR1A*, BRCA1*, BRCA2*, BRIP1*, CDC73*, CDH1*, CDK4, CDKN1B*, CDKN2A, CHEK2*, CTNNA1*, DICER1*, EPCAM (del/dup only), EGFR, FH*, FLCN*, GREM1 (promoter  dup only), HOXB13, KIT, LZTR1, MAX*, MBD4, MEN1*, MET, MITF, MLH1*, MSH2*, MSH3*, MSH6*, MUTYH*, NF1*, NF2*, NTHL1*, PALB2*, PDGFRA, PMS2*, POLD1*, POLE*, POT1*, PRKAR1A*, PTCH1*, PTEN*, RAD51C*, RAD51D*, RB1*, RET, SDHA* (sequencing only), SDHAF2*, SDHB*, SDHC*, SDHD*, SMAD4*, SMARCA4*, SMARCB1*, SMARCE1*, STK11*, SUFU*, TMEM127*, TP53*, TSC1*, TSC2*, VHL*. RNA analysis is performed for * genes.   04/14/2022 Surgery   Left breast mastectomy: IDC, grade 2, 0.8cm, 3/17 LN positive for macrometastases, margins negative.    05/09/2022 Oncotype testing   10/12%   06/06/2022 - 07/21/2022 Radiation Therapy   Plan Name: CW_L_BO_BH Site: Chest Wall, Left Technique: 3D Mode: Photon Dose Per Fraction: 1.8 Gy Prescribed Dose (Delivered / Prescribed): 25.2 Gy / 25.2 Gy Prescribed Fxs (Delivered / Prescribed): 14 / 14   Plan Name: CW_PAB_SCV_BH Site: Chest Wall, Left Technique: 3D Mode: Photon Dose Per Fraction: 1.8 Gy Prescribed Dose (Delivered / Prescribed): 50.4 Gy / 50.4 Gy Prescribed Fxs (Delivered / Prescribed): 28 / 28   Plan Name: CW_L_Bst_BO Site: Chest Wall, Left Technique: Electron Mode: Electron Dose Per Fraction: 2 Gy Prescribed Dose (Delivered / Prescribed): 10 Gy / 10 Gy Prescribed Fxs (Delivered / Prescribed): 5 / 5   Plan Name: CW_L_BH Site: Chest Wall, Left Technique: 3D Mode: Photon Dose Per Fraction: 1.8 Gy Prescribed Dose (Delivered / Prescribed): 25.2 Gy / 25.2 Gy Prescribed Fxs (Delivered / Prescribed): 14 / 14   10/04/2022 -  Anti-estrogen oral therapy   Letrozole    She is now status post left mastectomy, extensive DCIS intermediate nuclear grade, invasive moderately differentiated adenocarcinoma grade 2, invasive tumor measures 8 mm in greatest dimension.  Margins free.  Left axillary lymph nodes regional resection which showed 3 of 17 lymph nodes with metastatic carcinoma showing marked fatty  replacement.  Negative for extracapsular extension.  Prior prognostic  showed ER +95% strong staining PR 90% moderate to strong staining HER2 negative 1+ Ki-67 of 30%. She completed adjuvant radiation on 07/21/2022. She is now on adjuvant verzenio and letrozole.  Discussed the use of AI scribe software for clinical note transcription with the patient, who gave verbal consent to proceed.  History of Present Illness    The patient, with a history of breast cancer, presents for a follow-up visit. She has been taking Verzenio, a medication for her condition, and reports experiencing side effects including nausea, diarrhea, tiredness, and constipation. The patient notes that the side effects have been manageable and have improved since the dosage was adjusted. She also mentions taking Metformin and Letrozole. The patient is considering retirement and expresses concern about potential changes in insurance coverage and the cost of medication. 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   not current problem, no hematologist    SURGICAL HISTORY: Past Surgical History:  Procedure Laterality Date   ABDOMINAL HYSTERECTOMY  11-18-1998   partial   BREAST BIOPSY Left 03/15/2022   [place] LT BREAST BX W LOC DEV 1ST LESION IMG BX SPEC US GUIDE 03/15/2022 [facility]   BREAST BIOPSY Left 03/15/2022   [place] LT BREAST BX W LOC DEV EA ADD LESION IMG BX SPEC US GUIDE 03/15/2022 [facility]   CHOLECYSTECTOMY  08/02/2011   Procedure: LAPAROSCOPIC CHOLECYSTECTOMY WITH INTRAOPERATIVE CHOLANGIOGRAM;  Surgeon: Valarie Merino, MD;  Location: [facility];  Service: General;  Laterality: N/A;   LAPAROSCOPIC GASTRIC BANDING  05/19/2008   LUMBAR DISC SURGERY  03/21/1996   MASTECTOMY W/ SENTINEL NODE BIOPSY Left 04/14/2022   Procedure: LEFT MASTECTOMY WITH  SENTINEL NODE BIOPSY;  Surgeon: Abigail Miyamoto, MD;  Location: [facility];  Service: General;  Laterality: Left;   THYROIDECTOMY N/A 09/08/2015   Procedure: TOTAL THYROIDECTOMY;  Surgeon: Darnell Level, MD;  Location: [facility];  Service: General;  Laterality: N/A;   TONSILLECTOMY      SOCIAL HISTORY: Social History   Socioeconomic History   Marital status: Married    Spouse name: Not on file   Number of children: Not on file   Years of education: Not on file   Highest education level: Not on file  Occupational History   Not on file  Tobacco Use   Smoking status: Never   Smokeless tobacco: Never  Substance and Sexual Activity   Alcohol use: No   Drug use: No   Sexual activity: Yes  Other Topics Concern   Not on file  Social History Narrative   Not on file   Social Drivers of Health   Financial Resource Strain: Low Risk  (03/23/2022)   Overall Financial Resource Strain (CARDIA)    Difficulty of Paying Living Expenses: Not hard at all  Food Insecurity: No Food Insecurity (03/23/2022)   Hunger Vital Sign    Worried About Running Out of Food in the Last Year: Never true    Ran Out of Food in the Last Year: Never true  Transportation Needs: No Transportation Needs (03/23/2022)   PRAPARE - Administrator, Civil Service (Medical): No    Lack of Transportation (Non-Medical): No  Physical Activity: Not on  file  Stress: Not on file  Social Connections: Not on file  Intimate Partner Violence: Not on file    FAMILY HISTORY: Family History  Problem Relation Age of Onset   Colon polyps Mother    Heart disease Mother    Diabetes Father    Heart disease Father    Breast cancer Maternal Aunt 61 - 69   Ovarian cancer Paternal Aunt    Bladder Cancer Paternal Uncle        paternal half-uncle   Breast cancer Cousin 56 - 69    ALLERGIES:  has no known allergies.  MEDICATIONS:  Current Outpatient Medications  Medication Sig Dispense Refill   abemaciclib  (VERZENIO) 50 MG tablet Take 1 tablet (50 mg total) by mouth 2 (two) times daily. 56 tablet 1   ACCU-CHEK GUIDE test strip check sugar 3x daily In Vitro three times daily for 90 days     Accu-Chek Softclix Lancets lancets USE 1 TO CHECK GLUCOSE THREE TIMES DAILY     aspirin 81 MG tablet Take 81 mg by mouth daily.      benazepril-hydrochlorthiazide (LOTENSIN HCT) 20-25 MG tablet Take 1 tablet by mouth daily.     Blood Glucose Monitoring Suppl (ACCU-CHEK GUIDE) w/Device KIT check glucose daily and as needed for 365 days     Cyanocobalamin (B-12) 500 MCG SUBL Place 1 tablet under the tongue daily.     docusate sodium (COLACE) 100 MG capsule Take 100 mg by mouth daily.     EUTHYROX 175 MCG tablet Take 175 mcg by mouth daily before breakfast.     letrozole (FEMARA) 2.5 MG tablet Take 1 tablet by mouth once daily 90 tablet 3   metFORMIN (GLUCOPHAGE) 500 MG tablet 500 mg. 1 tablet with breakfast, 1 tablet with supper     metoprolol succinate (TOPROL XL) 50 MG 24 hr tablet Take 1 tablet (50 mg total) by mouth daily. Take with or immediately following a meal. 90 tablet 2   nystatin cream (MYCOSTATIN) 1 application Externally Twice a day as needed for 30 days     ondansetron (ZOFRAN) 8 MG tablet Take 1 tablet (8 mg total) by mouth every 8 (eight) hours as needed for nausea or vomiting. 30 tablet 2   rosuvastatin (CRESTOR) 40 MG tablet Take 1 tablet (40 mg total) by mouth daily. 90 tablet 2   terbinafine (LAMISIL) 250 MG tablet  (Patient not taking: Reported on 11/08/2022)     triamcinolone (KENALOG) 0.025 % cream 1 application Externally Once a day as neeed     VITAMIN D3 1.25 MG (50000 UT) capsule Take 50,000 Units by mouth once a week.     No current facility-administered medications for this visit.   REVIEW OF SYSTEMS:   Constitutional: Denies fevers, chills or abnormal night sweats Eyes: Denies blurriness of vision, double vision or watery eyes Ears, nose, mouth, throat, and face: Denies mucositis  or sore throat Respiratory: Denies cough, dyspnea or wheezes Cardiovascular: Denies palpitation, chest discomfort or lower extremity swelling Gastrointestinal:  Denies nausea, heartburn or change in bowel habits Skin: Denies abnormal skin rashes Lymphatics: Denies new lymphadenopathy or easy bruising Neurological:Denies numbness, tingling or new weaknesses Behavioral/Psych: Mood is stable, no new changes  Breast: Denies any palpable lumps or discharge All other systems were reviewed with the patient and are negative.  PHYSICAL EXAMINATION: ECOG PERFORMANCE STATUS: 0 - Asymptomatic  Vitals:   01/31/23 1031  BP: (!) 148/70  Pulse: 84  Resp: 17  Temp: 97.9 F (  36.6 C)  SpO2: 99%    Filed Weights   01/31/23 1031  Weight: 215 lb (97.5 kg)    Physical Exam Constitutional:      Appearance: Normal appearance.  Cardiovascular:     Rate and Rhythm: Normal rate and regular rhythm.     Pulses: Normal pulses.     Heart sounds: Normal heart sounds.  Pulmonary:     Effort: Pulmonary effort is normal.     Breath sounds: Normal breath sounds.  Chest:     Comments: She is status post left mastectomy, right breast normal to inspection and palpation. Abdominal:     General: Abdomen is flat. Bowel sounds are normal. There is no distension.     Palpations: There is no mass.  Musculoskeletal:     Cervical back: Normal range of motion. No rigidity.  Lymphadenopathy:     Cervical: No cervical adenopathy.  Skin:    General: Skin is warm and dry.  Neurological:     General: No focal deficit present.     Mental Status: She is alert.  Psychiatric:        Mood and Affect: Mood normal.      LABORATORY DATA:  I have reviewed the data as listed Lab Results  Component Value Date   WBC 4.7 01/31/2023   HGB 11.7 (L) 01/31/2023   HCT 34.0 (L) 01/31/2023   MCV 90.7 01/31/2023   PLT 161 01/31/2023   Lab Results  Component Value Date   NA 140 01/31/2023   K 3.4 (L) 01/31/2023   CL  104 01/31/2023   CO2 27 01/31/2023    RADIOGRAPHIC STUDIES: I have personally reviewed the radiological reports and agreed with the findings in the report.  ASSESSMENT AND PLAN:   Malignant neoplasm of upper-outer quadrant of left breast in female, estrogen receptor positive (HCC) This is a very pleasant 67 yr old female patient with PMH significant for HTN, obesity, hyperlipidemia referred to breast MDC for recommendations.  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  We sent for Oncotype testing but we are waiting for the results.  If Oncotype suggests benefit from addition of chemotherapy, we will consider chemotherapy.  If there is no benefit from chemo, she will proceed with radiation followed by adjuvant antiestrogen therapy and CDK 4 6 inhibition. Oncotype of 10, no role for chemo She completed radiation on 07/21/2022 She is on Verzenio and anastrozole, initially we did not consider Verzenio because of her intermediate grade and small size tumor but given 3 out of 17 lymph nodes positive, she wanted to give it a try. She is tolerating it well.  Tolerating Verzenio 50mg  with mild nausea and fatigue. Constipation noted, but patient prefers it to previous diarrhea.  -Continue Verzenio 50mg  and Letrozole. -Increase dietary potassium intake with foods such as orange juice, avocados, and bananas. -Check labs in 8 weeks.  Medication Assistance Patient plans to retire in approximately 6 months and will be enrolling in Medicare and Medicare Advantage. Concerns about continued medication assistance for Verzenio. -Inquire with medication assistance program about continued eligibility once patient is on Medicare and Medicare Advantage.  Guardant Reveal Patient has completed this test and results were good (0%). Concerns about billing and  insurance coverage. -Confirm with [employer] representative about billing procedures and insurance coverage. -Plan to repeat test in 6 months.  General Health  Maintenance -Encouraged to get flu and COVID vaccines. -Scheduled mammogram for January.  Follow-up in 8 weeks.  Total time spent: 30 minutes including history, physical exam, review of records, counseling and coordination of care, breast MDC discussion. All questions were answered. The patient knows to call the clinic with any problems, questions or concerns.    Rachel Moulds, MD 01/31/23

## 2023-01-31 NOTE — Telephone Encounter (Signed)
Oral Oncology Patient Advocate Encounter  Called patient to follow up regarding concerns about assistance options for Verzenio once she switches her insurance to Medicare/Medicare Advantage in ~6 months.   Patient will need to provide new insurance information to the office once available so a new PA can be completed.  Once the PA is completed and cost can be assessed patient options will vary based on the copay.  Because the patient will be on Medicare they will be eligible for assistance from privately funded foundations (grants) if funds are open at that time.  If no grant funds are open, patient will be able to apply for assistance through [employer] provided they fall within certain income limits.  Jinger Neighbors, CPhT-Adv Oncology Pharmacy Patient Advocate [facility] Direct Number: [phone]  Fax: [phone]

## 2023-01-31 NOTE — Assessment & Plan Note (Addendum)
This is a very pleasant 67 yr old female patient with PMH significant for HTN, obesity, hyperlipidemia referred to breast MDC for recommendations.  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  We sent for Oncotype testing but we are waiting for the results.  If Oncotype suggests benefit from addition of chemotherapy, we will consider chemotherapy.  If there is no benefit from chemo, she will proceed with radiation followed by adjuvant antiestrogen therapy and CDK 4 6 inhibition. Oncotype of 10, no role for chemo She completed radiation on 07/21/2022 She is on Verzenio and anastrozole, initially we did not consider Verzenio because of her intermediate grade and small size tumor but given 3 out of 17 lymph nodes positive, she wanted to give it a try. She is tolerating it well.  Tolerating Verzenio 50mg  with mild nausea and fatigue. Constipation noted, but patient prefers it to previous diarrhea.  -Continue Verzenio 50mg  and Letrozole. -Increase dietary potassium intake with foods such as orange juice, avocados, and bananas. -Check labs in 8 weeks.  Medication Assistance Patient plans to retire in approximately 6 months and will be enrolling in Medicare and Medicare Advantage. Concerns about continued medication assistance for Verzenio. -Inquire with medication assistance program about continued eligibility once patient is on Medicare and Medicare Advantage.  Guardant Reveal Patient has completed this test and results were good (0%). Concerns about billing and insurance coverage. -Confirm with [employer] representative about billing procedures and insurance coverage. -Plan to repeat test in 6 months.  General Health Maintenance -Encouraged to get flu and COVID vaccines. -Scheduled mammogram for January.  Follow-up in 8 weeks.

## 2023-02-01 DIAGNOSIS — E876 Hypokalemia: Secondary | ICD-10-CM | POA: Diagnosis not present

## 2023-02-01 DIAGNOSIS — I10 Essential (primary) hypertension: Secondary | ICD-10-CM | POA: Diagnosis not present

## 2023-02-01 DIAGNOSIS — Z23 Encounter for immunization: Secondary | ICD-10-CM | POA: Diagnosis not present

## 2023-02-01 DIAGNOSIS — E119 Type 2 diabetes mellitus without complications: Secondary | ICD-10-CM | POA: Diagnosis not present

## 2023-02-06 ENCOUNTER — Ambulatory Visit: Payer: BC Managed Care – PPO | Attending: Surgery

## 2023-02-06 VITALS — Wt 211.2 lb

## 2023-02-06 DIAGNOSIS — Z483 Aftercare following surgery for neoplasm: Secondary | ICD-10-CM

## 2023-02-06 NOTE — Therapy (Signed)
OUTPATIENT PHYSICAL THERAPY SOZO SCREENING NOTE   Patient Name: Lauren [name] MRN: [id number] DOB:07-12-1955, 67 y.o., female Today's Date: 02/06/2023  PCP: Camie Patience, FNP REFERRING PROVIDER: Abigail Miyamoto, MD   PT End of Session - 02/06/23 1629     Visit Number 1   # unchanged due to screen only   PT Start Time 1627    PT Stop Time 1631    PT Time Calculation (min) 4 min    Activity Tolerance Patient tolerated treatment well    Behavior During Therapy WFL for tasks assessed/performed             Past Medical History:  Diagnosis Date   Breast cancer (HCC)    Diabetes mellitus without complication (HCC)    Hyperlipidemia    Hypertension    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   not current problem, no hematologist   Past Surgical History:  Procedure Laterality Date   ABDOMINAL HYSTERECTOMY  11-18-1998   partial   BREAST BIOPSY Left 03/15/2022   [place] LT BREAST BX W LOC DEV 1ST LESION IMG BX SPEC US GUIDE 03/15/2022 [facility]   BREAST BIOPSY Left 03/15/2022   [place] LT BREAST BX W LOC DEV EA ADD LESION IMG BX SPEC US GUIDE 03/15/2022 [facility]   CHOLECYSTECTOMY  08/02/2011   Procedure: LAPAROSCOPIC CHOLECYSTECTOMY WITH INTRAOPERATIVE CHOLANGIOGRAM;  Surgeon: Valarie Merino, MD;  Location: [facility];  Service: General;  Laterality: N/A;   LAPAROSCOPIC GASTRIC BANDING  05/19/2008   LUMBAR DISC SURGERY  03/21/1996   MASTECTOMY W/ SENTINEL NODE BIOPSY Left 04/14/2022   Procedure: LEFT MASTECTOMY WITH SENTINEL NODE BIOPSY;  Surgeon: Abigail Miyamoto, MD;  Location: [facility];  Service: General;  Laterality: Left;   THYROIDECTOMY N/A 09/08/2015   Procedure: TOTAL THYROIDECTOMY;  Surgeon: Darnell Level, MD;  Location: [facility];  Service: General;  Laterality: N/A;   TONSILLECTOMY     Patient Active Problem List   Diagnosis Date Noted   Vitamin B deficiency 11/08/2022   Type 2 diabetes mellitus without complication, without  long-term current use of insulin (HCC) 11/08/2022   Pre-diabetes 11/08/2022   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   Memory impairment 11/08/2022   Hypomagnesemia 11/08/2022   Hypertension 11/08/2022   Hyperlipidemia 11/08/2022   History of papillary adenocarcinoma of thyroid 11/08/2022   History of ITP 11/08/2022   History of COVID-19 11/08/2022   History of adenomatous polyp of colon 11/08/2022   GERD (gastroesophageal reflux disease) 11/08/2022   Cholelithiasis 11/08/2022   BMI 40.0-44.9, adult (HCC) 11/08/2022   Aortic atherosclerosis (HCC) 11/08/2022   Genetic testing 03/31/2022   Malignant neoplasm of upper-outer quadrant of left breast in female, estrogen receptor positive (HCC) 03/21/2022   Disorder of tendon 02/17/2017   Toxic multinodular goiter 09/08/2015   Hyperthyroidism 09/04/2015   Multinodular goiter 04/29/2011   Chronic cholecystitis 04/29/2011   Lapband APS April 2010 03/18/2011   GIST (gastrointestinal stromal tumor), 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 It measures 9.2 cm of calcifications and is located in the upper outer quadrant. It is ER/PR positive and HER2 negative with a Ki67 of 30%.   PRECAUTIONS: left UE Lymphedema risk, None  SUBJECTIVE: Pt returns for her 1 month reassess after having a high  change from baseline.   PAIN:  Are you having pain? No  SOZO SCREENING: Patient was assessed today using the SOZO machine to determine the lymphedema index score. This was compared to her baseline score. It was determined that she is within the recommended range when compared to her baseline and no further action is needed at this time. She will continue SOZO screenings. These are done  every 3 months for 2 years post operatively followed by every 6 months for 2 years, and then annually.   L-DEX FLOWSHEETS - 02/06/23 1600       L-DEX LYMPHEDEMA SCREENING   Measurement Type Unilateral    L-DEX MEASUREMENT EXTREMITY Upper Extremity    POSITION  Standing    DOMINANT SIDE Left    At Risk Side Left    BASELINE SCORE (UNILATERAL) -3.2    L-DEX SCORE (UNILATERAL) -8.4    VALUE CHANGE (UNILAT) -5.2             P: Resume every 3 month L-Dex screens.  Hermenia Bers, PTA 02/06/2023, 4:30 PM

## 2023-02-09 ENCOUNTER — Other Ambulatory Visit: Payer: Self-pay | Admitting: Hematology and Oncology

## 2023-03-03 ENCOUNTER — Telehealth: Payer: Self-pay | Admitting: *Deleted

## 2023-03-03 ENCOUNTER — Other Ambulatory Visit: Payer: Self-pay | Admitting: *Deleted

## 2023-03-03 MED ORDER — DICYCLOMINE HCL 10 MG PO CAPS: 10.0000 mg | ORAL_CAPSULE | Freq: Two times a day (BID) | ORAL | 0 refills | Status: AC | PRN

## 2023-03-03 NOTE — Telephone Encounter (Signed)
Pt left VM stating onset this week of " more severe abdominal cramping ".  She states "so severe Wednesday night that it woke me up in my sleep"   She states onset of nausea "like in my upper stomach - and I took zofran but that didn't help"  She states decreased intake due to symptoms.  Return call number given as [phone].  Pt returned call to this RN - discussed symptoms.   She denies any incontinent diarrhea with complaint more of the cramping which presently is not as bad - though she did just have a 2 hour meeting - with cramping - " hoping I wasn't going to have an emergency situation"  Per review- recommendation is for pt to use pepcid 20 mg bid x 1 week, may use Gas X for possible benefit. Short term prescription for bentyl for severe cramping.  Above discussed with pt who verbalized understanding as well as request to call in 1 week with update if other issue do not arise.  This message will be forwarded to Jonny Ruiz I for review of communication and symptoms.

## 2023-03-08 ENCOUNTER — Ambulatory Visit
Admission: RE | Admit: 2023-03-08 | Discharge: 2023-03-08 | Disposition: A | Discharge status: Home / Self Care | Payer: BC Managed Care – PPO | Source: Ambulatory Visit | Attending: Hematology and Oncology | Admitting: Hematology and Oncology

## 2023-03-08 DIAGNOSIS — Z1231 Encounter for screening mammogram for malignant neoplasm of breast: Secondary | ICD-10-CM | POA: Diagnosis not present

## 2023-03-08 DIAGNOSIS — Z170 Estrogen receptor positive status [ER+]: Secondary | ICD-10-CM

## 2023-03-20 DIAGNOSIS — C73 Malignant neoplasm of thyroid gland: Secondary | ICD-10-CM | POA: Diagnosis not present

## 2023-03-27 ENCOUNTER — Telehealth: Payer: Self-pay

## 2023-03-27 NOTE — Telephone Encounter (Signed)
 Spoke with patient to confirm appointment on 2/11 with Dr Arno Bibles at 9:15am.  Also advised patient she has lab at 8:45am and try to arrive about 15 minutes early to get through check in.  Patient confirmed and understood.

## 2023-03-28 ENCOUNTER — Encounter: Payer: Self-pay | Admitting: Hematology and Oncology

## 2023-03-28 ENCOUNTER — Inpatient Hospital Stay (HOSPITAL_BASED_OUTPATIENT_CLINIC_OR_DEPARTMENT_OTHER): Payer: BC Managed Care – PPO | Admitting: Hematology and Oncology

## 2023-03-28 ENCOUNTER — Inpatient Hospital Stay: Payer: BC Managed Care – PPO | Attending: Hematology and Oncology

## 2023-03-28 VITALS — BP 155/83 | HR 81 | Temp 97.6°F | Resp 17 | Wt 209.1 lb

## 2023-03-28 DIAGNOSIS — Z1732 Human epidermal growth factor receptor 2 negative status: Secondary | ICD-10-CM | POA: Insufficient documentation

## 2023-03-28 DIAGNOSIS — Z1721 Progesterone receptor positive status: Secondary | ICD-10-CM | POA: Diagnosis not present

## 2023-03-28 DIAGNOSIS — C50412 Malignant neoplasm of upper-outer quadrant of left female breast: Secondary | ICD-10-CM | POA: Insufficient documentation

## 2023-03-28 DIAGNOSIS — Z803 Family history of malignant neoplasm of breast: Secondary | ICD-10-CM | POA: Diagnosis not present

## 2023-03-28 DIAGNOSIS — Z7984 Long term (current) use of oral hypoglycemic drugs: Secondary | ICD-10-CM | POA: Diagnosis not present

## 2023-03-28 DIAGNOSIS — Z8052 Family history of malignant neoplasm of bladder: Secondary | ICD-10-CM | POA: Diagnosis not present

## 2023-03-28 DIAGNOSIS — Z923 Personal history of irradiation: Secondary | ICD-10-CM | POA: Insufficient documentation

## 2023-03-28 DIAGNOSIS — Z79899 Other long term (current) drug therapy: Secondary | ICD-10-CM | POA: Insufficient documentation

## 2023-03-28 DIAGNOSIS — E041 Nontoxic single thyroid nodule: Secondary | ICD-10-CM | POA: Insufficient documentation

## 2023-03-28 DIAGNOSIS — Z8041 Family history of malignant neoplasm of ovary: Secondary | ICD-10-CM | POA: Diagnosis not present

## 2023-03-28 DIAGNOSIS — Z170 Estrogen receptor positive status [ER+]: Secondary | ICD-10-CM | POA: Insufficient documentation

## 2023-03-28 DIAGNOSIS — Z79811 Long term (current) use of aromatase inhibitors: Secondary | ICD-10-CM | POA: Insufficient documentation

## 2023-03-28 DIAGNOSIS — E119 Type 2 diabetes mellitus without complications: Secondary | ICD-10-CM | POA: Insufficient documentation

## 2023-03-28 LAB — CBC WITH DIFFERENTIAL (CANCER CENTER ONLY)
Abs Immature Granulocytes: 0.01 10*3/uL (ref 0.00–0.07)
Basophils Absolute: 0.1 10*3/uL (ref 0.0–0.1)
Basophils Relative: 2 %
Eosinophils Absolute: 0.2 10*3/uL (ref 0.0–0.5)
Eosinophils Relative: 5 %
HCT: 34.2 % — ABNORMAL LOW (ref 36.0–46.0)
Hemoglobin: 11.9 g/dL — ABNORMAL LOW (ref 12.0–15.0)
Immature Granulocytes: 0 %
Lymphocytes Relative: 30 %
Lymphs Abs: 1.2 10*3/uL (ref 0.7–4.0)
MCH: 32.9 pg (ref 26.0–34.0)
MCHC: 34.8 g/dL (ref 30.0–36.0)
MCV: 94.5 fL (ref 80.0–100.0)
Monocytes Absolute: 0.4 10*3/uL (ref 0.1–1.0)
Monocytes Relative: 9 %
Neutro Abs: 2.1 10*3/uL (ref 1.7–7.7)
Neutrophils Relative %: 54 %
Platelet Count: 162 10*3/uL (ref 150–400)
RBC: 3.62 MIL/uL — ABNORMAL LOW (ref 3.87–5.11)
RDW: 13.3 % (ref 11.5–15.5)
WBC Count: 4 10*3/uL (ref 4.0–10.5)
nRBC: 0 % (ref 0.0–0.2)

## 2023-03-28 LAB — CMP (CANCER CENTER ONLY)
ALT: 17 U/L (ref 0–44)
AST: 16 U/L (ref 15–41)
Albumin: 3.9 g/dL (ref 3.5–5.0)
Alkaline Phosphatase: 51 U/L (ref 38–126)
Anion gap: 7 (ref 5–15)
BUN: 18 mg/dL (ref 8–23)
CO2: 28 mmol/L (ref 22–32)
Calcium: 9.5 mg/dL (ref 8.9–10.3)
Chloride: 105 mmol/L (ref 98–111)
Creatinine: 1.18 mg/dL — ABNORMAL HIGH (ref 0.44–1.00)
GFR, Estimated: 51 mL/min — ABNORMAL LOW (ref >60)
Glucose, Bld: 160 mg/dL — ABNORMAL HIGH (ref 70–99)
Potassium: 3.7 mmol/L (ref 3.5–5.1)
Sodium: 140 mmol/L (ref 135–145)
Total Bilirubin: 0.7 mg/dL (ref 0.0–1.2)
Total Protein: 6.4 g/dL — ABNORMAL LOW (ref 6.5–8.1)

## 2023-03-28 NOTE — Assessment & Plan Note (Signed)
This is a very pleasant 68 yr old female patient with PMH significant for HTN, obesity, hyperlipidemia referred to breast MDC for recommendations.  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   Oncotype of 10, no role for chemo She completed radiation on 07/21/2022 She is now on verzenio and anastrozole.  Breast Cancer on Verzenio Patient experiencing frequent nausea and diarrhea, likely side effects of Verzenio. Patient willing to tolerate side effects and continue treatment. -Continue Verzenio as tolerated. -Consider alternative treatment with Ribociclib if GI side effects become intolerable.  Diabetes Patient on Metformin, which may be contributing to GI symptoms. -Discuss with primary care provider potential adjustment of Metformin or consideration of alternative diabetes medications.  Thyroid Nodule Possible residual thyroid cancer, currently under surveillance. -Continue monitoring with endocrinologist.  Follow-up -Plan for labs every 8 weeks. Guardant reveal every 6 months. -Alternate follow-up visits between oncologist and pharmacist every 8 weeks. -Consider EKG monitoring if switch to Ribociclib becomes necessary.

## 2023-03-28 NOTE — Progress Notes (Signed)
 [facility] CONSULT NOTE  Patient Care Team: Camie Patience, FNP as PCP - General (Family Medicine) Meriam Sprague, MD (Inactive) as PCP - Cardiology (Cardiology) Abigail Miyamoto, MD as Consulting Physician (General Surgery) Rachel Moulds, MD as Consulting Physician (Hematology and Oncology) Dorothy Puffer, MD as Consulting Physician (Radiation Oncology)  CHIEF COMPLAINTS/PURPOSE OF CONSULTATION:  Newly diagnosed breast cancer  HISTORY OF PRESENTING ILLNESS:  Lauren [name] 68 y.o. female is here because of recent diagnosis of left breast cancer.  I reviewed her records extensively and collaborated the history with the patient.  SUMMARY OF ONCOLOGIC HISTORY: Oncology History  Malignant neoplasm of upper-outer quadrant of left breast in female, estrogen receptor positive (HCC)  03/04/2022 Mammogram   Screening mammogram showed calcs and possible distortion in the left breast. Diag mammogram showed suspicious calcs and non mass finding in the 2 0 clock location of the left breast warranting further tissue diagnosis. No left axillary adenopathy   03/10/2022 Breast US   Suspicious calcifications and non mass enhancement measuring approximately 4.9 cm in largest dimension.   03/23/2022 Cancer Staging   Staging form: Breast, AJCC 8th Edition - Pathologic: Stage IA (pT1b, pN1a, cM0, G2, ER+, PR+, HER2-) - Signed by Rachel Moulds, MD on 05/06/2022 Histologic grading system: 3 grade system   03/25/2022 Pathology Results   Pathology showed grade 2 IDC, ER positive, PR positive, her 2 neg, Ki 67 30%   03/31/2022 Genetic Testing   Negative. The Multi-Cancer + RNA Panel offered by [facility] includes sequencing and/or deletion/duplication analysis of the following 70 genes:  AIP*, ALK, APC*, ATM*, AXIN2*, BAP1*, BARD1*, BLM*, BMPR1A*, BRCA1*, BRCA2*, BRIP1*, CDC73*, CDH1*, CDK4, CDKN1B*, CDKN2A, CHEK2*, CTNNA1*, DICER1*, EPCAM (del/dup only), EGFR, FH*, FLCN*, GREM1 (promoter  dup only), HOXB13, KIT, LZTR1, MAX*, MBD4, MEN1*, MET, MITF, MLH1*, MSH2*, MSH3*, MSH6*, MUTYH*, NF1*, NF2*, NTHL1*, PALB2*, PDGFRA, PMS2*, POLD1*, POLE*, POT1*, PRKAR1A*, PTCH1*, PTEN*, RAD51C*, RAD51D*, RB1*, RET, SDHA* (sequencing only), SDHAF2*, SDHB*, SDHC*, SDHD*, SMAD4*, SMARCA4*, SMARCB1*, SMARCE1*, STK11*, SUFU*, TMEM127*, TP53*, TSC1*, TSC2*, VHL*. RNA analysis is performed for * genes.   04/14/2022 Surgery   Left breast mastectomy: IDC, grade 2, 0.8cm, 3/17 LN positive for macrometastases, margins negative.    05/09/2022 Oncotype testing   10/12%   06/06/2022 - 07/21/2022 Radiation Therapy   Plan Name: CW_L_BO_BH Site: Chest Wall, Left Technique: 3D Mode: Photon Dose Per Fraction: 1.8 Gy Prescribed Dose (Delivered / Prescribed): 25.2 Gy / 25.2 Gy Prescribed Fxs (Delivered / Prescribed): 14 / 14   Plan Name: CW_PAB_SCV_BH Site: Chest Wall, Left Technique: 3D Mode: Photon Dose Per Fraction: 1.8 Gy Prescribed Dose (Delivered / Prescribed): 50.4 Gy / 50.4 Gy Prescribed Fxs (Delivered / Prescribed): 28 / 28   Plan Name: CW_L_Bst_BO Site: Chest Wall, Left Technique: Electron Mode: Electron Dose Per Fraction: 2 Gy Prescribed Dose (Delivered / Prescribed): 10 Gy / 10 Gy Prescribed Fxs (Delivered / Prescribed): 5 / 5   Plan Name: CW_L_BH Site: Chest Wall, Left Technique: 3D Mode: Photon Dose Per Fraction: 1.8 Gy Prescribed Dose (Delivered / Prescribed): 25.2 Gy / 25.2 Gy Prescribed Fxs (Delivered / Prescribed): 14 / 14   10/04/2022 -  Anti-estrogen oral therapy   Letrozole    She is now status post left mastectomy, extensive DCIS intermediate nuclear grade, invasive moderately differentiated adenocarcinoma grade 2, invasive tumor measures 8 mm in greatest dimension.  Margins free.  Left axillary lymph nodes regional resection which showed 3 of 17 lymph nodes with metastatic carcinoma showing marked fatty  replacement.  Negative for extracapsular extension.  Prior prognostic  showed ER +95% strong staining PR 90% moderate to strong staining HER2 negative 1+ Ki-67 of 30%. She completed adjuvant radiation on 07/21/2022. She is now on adjuvant verzenio and letrozole.  Discussed the use of AI scribe software for clinical note transcription with the patient, who gave verbal consent to proceed.  History of Present Illness    Lauren [name] is a 68 year old female with breast cancer who presents with gastrointestinal side effects from [employer].  She has been experiencing gastrointestinal side effects since starting Verzenio in October 2024. Diarrhea occurs approximately three days a week, characterized by liquid stools and cramping in the lower abdomen. Nausea is present about six days a week, typically between 11 AM and 3 PM, described as a queasy feeling not severe enough to require medication.  She manages her symptoms with Imodium, taking it two to three times a week, especially when at work to prevent diarrhea. This sometimes results in constipation for a couple of days, followed by diarrhea. She describes herself as 'normally constipated,' with bowel movements every three to four days, but currently goes more frequently. She is also on metformin for diabetes, recently increased to two pills at night, and plans to discuss potential adjustments with her primary care provider, suspecting it may contribute to her gastrointestinal symptoms.  Her past medical history includes breast cancer, for which she underwent radiation in June 2024 and started letrozole in August 2024, Verzenio in October. A mammogram in January 2025 was normal. She is undergoing treatment with Verzenio and letrozole, with Verzenio planned for two years and letrozole potentially continuing beyond five years.  She has a history of thyroid issues, with concerns about potential cancer, but a biopsy attempt was unsuccessful. She is currently being monitored by an endocrinologist. 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   not current problem, no hematologist    SURGICAL HISTORY: Past Surgical History:  Procedure Laterality Date   ABDOMINAL HYSTERECTOMY  11-18-1998   partial   BREAST BIOPSY Left 03/15/2022   [place] LT BREAST BX W LOC DEV 1ST LESION IMG BX SPEC US GUIDE 03/15/2022 [facility]   BREAST BIOPSY Left 03/15/2022   [place] LT BREAST BX W LOC DEV EA ADD LESION IMG BX SPEC US GUIDE 03/15/2022 [facility]   CHOLECYSTECTOMY  08/02/2011   Procedure: LAPAROSCOPIC CHOLECYSTECTOMY WITH INTRAOPERATIVE CHOLANGIOGRAM;  Surgeon: Valarie Merino, MD;  Location: [facility];  Service: General;  Laterality: N/A;   LAPAROSCOPIC GASTRIC BANDING  05/19/2008   LUMBAR DISC SURGERY  03/21/1996   MASTECTOMY W/ SENTINEL NODE BIOPSY Left 04/14/2022   Procedure: LEFT MASTECTOMY WITH SENTINEL NODE BIOPSY;  Surgeon: Abigail Miyamoto, MD;  Location: [facility];  Service: General;  Laterality: Left;   THYROIDECTOMY N/A 09/08/2015   Procedure: TOTAL THYROIDECTOMY;  Surgeon: Darnell Level, MD;  Location: [facility];  Service: General;  Laterality: N/A;   TONSILLECTOMY      SOCIAL HISTORY: Social History   Socioeconomic History   Marital status: Married    Spouse name: Not on file   Number of children: Not on file   Years of education: Not on file   Highest education level: Not on file  Occupational History   Not on file  Tobacco Use   Smoking status: Never   Smokeless tobacco: Never  Substance and Sexual Activity   Alcohol use: No   Drug use: No   Sexual activity: Yes  Other Topics Concern   Not on file  Social History Narrative   Not on file   Social Drivers of Health   Financial Resource Strain: Low Risk  (03/23/2022)   Overall Financial Resource  Strain (CARDIA)    Difficulty of Paying Living Expenses: Not hard at all  Food Insecurity: No Food Insecurity (03/23/2022)   Hunger Vital Sign    Worried About Running Out of Food in the Last Year: Never true    Ran Out of Food in the Last Year: Never true  Transportation Needs: No Transportation Needs (03/23/2022)   PRAPARE - Administrator, Civil Service (Medical): No    Lack of Transportation (Non-Medical): No  Physical Activity: Not on file  Stress: Not on file  Social Connections: Not on file  Intimate Partner Violence: Not on file    FAMILY HISTORY: Family History  Problem Relation Age of Onset   Colon polyps Mother    Heart disease Mother    Diabetes Father    Heart disease Father    Breast cancer Maternal Aunt 29 - 69   Ovarian cancer Paternal Aunt    Bladder Cancer Paternal Uncle        paternal half-uncle   Breast cancer Cousin 79 - 60    ALLERGIES:  has no known allergies.  MEDICATIONS:  Current Outpatient Medications  Medication Sig Dispense Refill   dicyclomine (BENTYL) 10 MG capsule Take 1 capsule (10 mg total) by mouth 2 (two) times daily as needed for spasms. 30 capsule 0   ACCU-CHEK GUIDE test strip check sugar 3x daily In Vitro three times daily for 90 days     Accu-Chek Softclix Lancets lancets USE 1 TO CHECK GLUCOSE THREE TIMES DAILY     aspirin 81 MG tablet Take 81 mg by mouth daily.      benazepril-hydrochlorthiazide (LOTENSIN HCT) 20-25 MG tablet Take 1 tablet by mouth daily.     Blood Glucose Monitoring Suppl (ACCU-CHEK GUIDE) w/Device KIT check glucose daily and as needed for 365 days     Cyanocobalamin (B-12) 500 MCG SUBL Place 1 tablet under the tongue daily.     docusate sodium (COLACE) 100 MG capsule Take 100 mg by mouth daily.     EUTHYROX 175 MCG tablet Take 175 mcg by mouth daily before breakfast.     letrozole (FEMARA) 2.5 MG tablet Take 1 tablet by mouth once daily 90 tablet 3   metFORMIN (GLUCOPHAGE) 500 MG tablet 500 mg. 1 tablet  with breakfast, 1 tablet with supper     metoprolol succinate (TOPROL XL) 50 MG 24 hr tablet Take 1 tablet (50 mg total) by mouth daily. Take with or immediately following a meal. 90 tablet 2   nystatin cream (MYCOSTATIN) 1 application Externally Twice a day as needed for 30 days     ondansetron (ZOFRAN) 8 MG tablet Take 1 tablet (8 mg total) by mouth every 8 (eight) hours as needed for nausea or vomiting. 30 tablet 2   rosuvastatin (CRESTOR) 40 MG tablet Take 1 tablet (40 mg total) by mouth daily. 90 tablet 2   terbinafine (LAMISIL) 250 MG tablet  (Patient not taking: Reported on 11/08/2022)     triamcinolone (KENALOG) 0.025 % cream 1 application Externally  Once a day as neeed     VERZENIO 50 MG tablet TAKE 1 TABLET BY MOUTH 2 TIMES A DAY 56 tablet 1   VITAMIN D3 1.25 MG (50000 UT) capsule Take 50,000 Units by mouth once a week.     No current facility-administered medications for this visit.   REVIEW OF SYSTEMS:   Constitutional: Denies fevers, chills or abnormal night sweats Eyes: Denies blurriness of vision, double vision or watery eyes Ears, nose, mouth, throat, and face: Denies mucositis or sore throat Respiratory: Denies cough, dyspnea or wheezes Cardiovascular: Denies palpitation, chest discomfort or lower extremity swelling Gastrointestinal:  Denies nausea, heartburn or change in bowel habits Skin: Denies abnormal skin rashes Lymphatics: Denies new lymphadenopathy or easy bruising Neurological:Denies numbness, tingling or new weaknesses Behavioral/Psych: Mood is stable, no new changes  Breast: Denies any palpable lumps or discharge All other systems were reviewed with the patient and are negative.  PHYSICAL EXAMINATION: ECOG PERFORMANCE STATUS: 0 - Asymptomatic  Vitals:   03/28/23 0854  BP: (!) 155/83  Pulse: 81  Resp: 17  Temp: 97.6 F (36.4 C)  SpO2: 100%    Filed Weights   03/28/23 0854  Weight: 209 lb 1.6 oz (94.8 kg)    Physical Exam Constitutional:       Appearance: Normal appearance.  Cardiovascular:     Rate and Rhythm: Normal rate and regular rhythm.     Pulses: Normal pulses.     Heart sounds: Normal heart sounds.  Pulmonary:     Effort: Pulmonary effort is normal.     Breath sounds: Normal breath sounds.  Abdominal:     General: Abdomen is flat. Bowel sounds are normal. There is no distension.     Palpations: There is no mass.  Musculoskeletal:     Cervical back: Normal range of motion. No rigidity.  Lymphadenopathy:     Cervical: No cervical adenopathy.  Skin:    General: Skin is warm and dry.  Neurological:     General: No focal deficit present.     Mental Status: She is alert.  Psychiatric:        Mood and Affect: Mood normal.      LABORATORY DATA:  I have reviewed the data as listed Lab Results  Component Value Date   WBC 4.0 03/28/2023   HGB 11.9 (L) 03/28/2023   HCT 34.2 (L) 03/28/2023   MCV 94.5 03/28/2023   PLT 162 03/28/2023   Lab Results  Component Value Date   NA 140 03/28/2023   K 3.7 03/28/2023   CL 105 03/28/2023   CO2 28 03/28/2023    RADIOGRAPHIC STUDIES: I have personally reviewed the radiological reports and agreed with the findings in the report.  ASSESSMENT AND PLAN:   Malignant neoplasm of upper-outer quadrant of left breast in female, estrogen receptor positive (HCC) This is a very pleasant 68 yr old female patient with PMH significant for HTN, obesity, hyperlipidemia referred to breast MDC for recommendations.  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   Oncotype of 10, no role for chemo She completed radiation on 07/21/2022 She is now on verzenio and anastrozole.  Breast Cancer on Verzenio Patient experiencing frequent nausea and diarrhea, likely side effects of Verzenio. Patient willing to tolerate side effects and continue  treatment. -Continue Verzenio as tolerated. -Consider alternative treatment with Ribociclib if GI side  effects become intolerable.  Diabetes Patient on Metformin, which may be contributing to GI symptoms. -Discuss with primary care provider potential adjustment of Metformin or consideration of alternative diabetes medications.  Thyroid Nodule Possible residual thyroid cancer, currently under surveillance. -Continue monitoring with endocrinologist.  Follow-up -Plan for labs every 8 weeks. Guardant reveal every 6 months. -Alternate follow-up visits between oncologist and pharmacist every 8 weeks. -Consider EKG monitoring if switch to Ribociclib becomes necessary.  Total time spent: 30 minutes including history, physical exam, review of records, counseling and coordination of care, breast MDC discussion. All questions were answered. The patient knows to call the clinic with any problems, questions or concerns.    Rachel Moulds, MD 03/28/23

## 2023-04-05 ENCOUNTER — Other Ambulatory Visit: Payer: Self-pay | Admitting: Hematology and Oncology

## 2023-04-19 ENCOUNTER — Other Ambulatory Visit (HOSPITAL_COMMUNITY): Payer: Self-pay

## 2023-04-25 DIAGNOSIS — C73 Malignant neoplasm of thyroid gland: Secondary | ICD-10-CM | POA: Diagnosis not present

## 2023-04-25 DIAGNOSIS — R9389 Abnormal findings on diagnostic imaging of other specified body structures: Secondary | ICD-10-CM | POA: Diagnosis not present

## 2023-04-25 DIAGNOSIS — E890 Postprocedural hypothyroidism: Secondary | ICD-10-CM | POA: Diagnosis not present

## 2023-05-04 DIAGNOSIS — E119 Type 2 diabetes mellitus without complications: Secondary | ICD-10-CM | POA: Diagnosis not present

## 2023-05-04 DIAGNOSIS — I10 Essential (primary) hypertension: Secondary | ICD-10-CM | POA: Diagnosis not present

## 2023-05-04 DIAGNOSIS — E785 Hyperlipidemia, unspecified: Secondary | ICD-10-CM | POA: Diagnosis not present

## 2023-05-04 DIAGNOSIS — I700 Atherosclerosis of aorta: Secondary | ICD-10-CM | POA: Diagnosis not present

## 2023-05-04 DIAGNOSIS — E559 Vitamin D deficiency, unspecified: Secondary | ICD-10-CM | POA: Diagnosis not present

## 2023-05-04 DIAGNOSIS — E538 Deficiency of other specified B group vitamins: Secondary | ICD-10-CM | POA: Diagnosis not present

## 2023-05-05 ENCOUNTER — Encounter: Payer: Self-pay | Admitting: *Deleted

## 2023-05-05 NOTE — Progress Notes (Signed)
 Received message from [facility] team stating patient was non responsive to mobile phlebotomy team.  Testing will be canceled at this time.  If pt wishes to proceed in the future, orders will be placed.

## 2023-05-16 ENCOUNTER — Other Ambulatory Visit (HOSPITAL_COMMUNITY): Payer: Self-pay

## 2023-05-18 NOTE — Progress Notes (Addendum)
 [facility]       Telephone: [phone]?Fax: [phone]   Oncology Clinical Pharmacist Practitioner Progress Note  Lauren [name] was contacted via in-person to discuss her chemotherapy regimen for abemaciclib which they receive under the care of Dr. Rachel Moulds.  Current treatment regimen and start date Abemaciclib (12/19/22) 50 mg BID (01/16/23) 100 mg BID (12/19/22) - stopped d/t GI side effects  Interval History She continues on abemaciclib 50 mg by mouth every 12 hours on days 1 to 28 of a 28-day cycle. This is being given in combination with letrozole . Therapy is planned to continue until two years in the adjuvant setting per the monarchE trial data. She last saw clinical pharmacy on 11/17/22 and Dr. Al Pimple on 03/28/23. Dr. Al Pimple would like her to be seen with labs every 8 weeks. She will follow up with Dr. Al Pimple again on 07/18/23.  Response to Therapy [name] is doing well. Her loose stools have subsided and she is now taking docusate so she does not get constipated. Her serum creatinine continues to be elevated and she admits to not drinking enough fluids. We again went over the importance of getting plenty of fluids in and she will do a better job of it. She also saw her PCP recently and they have lowered her metformin and started her on Jardiance.   She will be retiring likely at end of July and we gave her Claire's number today in case she needs updates with her abemaciclib coverage. She will see Dr. Al Pimple in June and likely clinical pharmacy again in August.  Labs, vitals, treatment parameters, and manufacturer guidelines assessing toxicity were reviewed with Lauren [name] today. Based on these values, patient is in agreement to continue abemaciclib therapy at this time.  Allergies No Known Allergies  Vitals    05/22/2023    9:43 AM 03/28/2023    8:54 AM 02/06/2023    4:27 PM  Oncology Vitals  Weight 93.554 kg 94.847 kg 95.822 kg  Weight (lbs) 206  lbs 4 oz 209 lbs 2 oz 211 lbs 4 oz  BMI 33.29 kg/m2 33.75 kg/m2 34.1 kg/m2  Temp  97.6 F (36.4 C)   Pulse Rate  81   BP  155/83   Resp  17   SpO2  100 %   BSA (m2) 2.09 m2 2.1 m2 2.11 m2    Laboratory Data    Latest Ref Rng & Units 03/28/2023    8:43 AM 01/31/2023    9:49 AM 01/02/2023    9:45 AM  CBC EXTENDED  WBC 4.0 - 10.5 K/uL 4.0  4.7  4.7   RBC 3.87 - 5.11 MIL/uL 3.62  3.75  4.09   Hemoglobin 12.0 - 15.0 g/dL 29.5  62.1  30.8   HCT 36.0 - 46.0 % 34.2  34.0  36.9   Platelets 150 - 400 K/uL 162  161  151   NEUT# 1.7 - 7.7 K/uL 2.1  3.2  3.1   Lymph# 0.7 - 4.0 K/uL 1.2  0.9  1.2        Latest Ref Rng & Units 03/28/2023    8:43 AM 01/31/2023    9:49 AM 01/02/2023    9:45 AM  CMP  Glucose 70 - 99 mg/dL 657  846  962   BUN 8 - 23 mg/dL 18  16  20    Creatinine 0.44 - 1.00 mg/dL 9.52  8.41  3.24   Sodium 135 - 145 mmol/L  140  140  140   Potassium 3.5 - 5.1 mmol/L 3.7  3.4  3.7   Chloride 98 - 111 mmol/L 105  104  102   CO2 22 - 32 mmol/L 28  27  32   Calcium 8.9 - 10.3 mg/dL 9.5  9.6  16.1   Total Protein 6.5 - 8.1 g/dL 6.4  6.3  6.6   Total Bilirubin 0.0 - 1.2 mg/dL 0.7  0.6  0.8   Alkaline Phos 38 - 126 U/L 51  59  50   AST 15 - 41 U/L 16  17  19    ALT 0 - 44 U/L 17  16  22      Adverse Effects Assessment Serum creatinine: 1.4 mg/dL today. Baseline is 0.71 mg/dL on 10/21/02. This is 1.18 x ULN (Grade 1 toxicity)  Adherence Assessment Carlisa H Wieman reports missing 2 doses over the past 8 weeks.   Reason for missed dose: forgot Patient was re-educated on importance of adherence.   Access Assessment CHARLITA BRIAN is currently receiving her abemaciclib through [employer] concerns:  none  Medication Reconciliation The patient's medication list was reviewed today with the patient? Yes New medications or herbal supplements have recently been started?  Yes, Jardiance Any medications have been discontinued? No  The medication list  was updated and reconciled based on the patient's most recent medication list in the electronic medical record (EMR) including herbal products and OTC medications.   Medications Current Outpatient Medications  Medication Sig Dispense Refill   dicyclomine (BENTYL) 10 MG capsule Take 1 capsule (10 mg total) by mouth 2 (two) times daily as needed for spasms. 30 capsule 0   ACCU-CHEK GUIDE test strip check sugar 3x daily In Vitro three times daily for 90 days     Accu-Chek Softclix Lancets lancets USE 1 TO CHECK GLUCOSE THREE TIMES DAILY     aspirin 81 MG tablet Take 81 mg by mouth daily.      benazepril-hydrochlorthiazide (LOTENSIN HCT) 20-25 MG tablet Take 1 tablet by mouth daily.     Blood Glucose Monitoring Suppl (ACCU-CHEK GUIDE) w/Device KIT check glucose daily and as needed for 365 days     Cyanocobalamin (B-12) 500 MCG SUBL Place 1 tablet under the tongue daily.     docusate sodium (COLACE) 100 MG capsule Take 100 mg by mouth daily.     EUTHYROX 175 MCG tablet Take 175 mcg by mouth daily before breakfast.     letrozole (FEMARA) 2.5 MG tablet Take 1 tablet by mouth once daily 90 tablet 3   metFORMIN (GLUCOPHAGE) 500 MG tablet 500 mg. 1 tablet with breakfast, 1 tablet with supper     metoprolol succinate (TOPROL XL) 50 MG 24 hr tablet Take 1 tablet (50 mg total) by mouth daily. Take with or immediately following a meal. 90 tablet 2   nystatin cream (MYCOSTATIN) 1 application Externally Twice a day as needed for 30 days     ondansetron (ZOFRAN) 8 MG tablet Take 1 tablet (8 mg total) by mouth every 8 (eight) hours as needed for nausea or vomiting. 30 tablet 2   rosuvastatin (CRESTOR) 40 MG tablet Take 1 tablet (40 mg total) by mouth daily. 90 tablet 2   terbinafine (LAMISIL) 250 MG tablet  (Patient not taking: Reported on 11/08/2022)     triamcinolone (KENALOG) 0.025 % cream 1 application Externally Once a day as neeed     VERZENIO 50 MG tablet TAKE 1 TABLET BY  MOUTH 2 TIMES A DAY 56 tablet 1    VITAMIN D3 1.25 MG (50000 UT) capsule Take 50,000 Units by mouth once a week.     No current facility-administered medications for this visit.    Drug-Drug Interactions (DDIs) DDIs were evaluated? Yes Significant DDIs? No , terbinafine is topical and PRN The patient was instructed to speak with their health care provider and/or the oral chemotherapy pharmacist before starting any new drug, including prescription or over the counter, natural / herbal products, or vitamins.  Supportive Care Diarrhea: we reviewed that diarrhea is common with abemaciclib and confirmed that she does have loperamide (Imodium) at home.  We reviewed how to take this medication PRN. Neutropenia: we discussed the importance of having a thermometer and what the Centers for Disease Control and Prevention (CDC) considers a fever which is 100.68F (38C) or higher.  Gave patient 24/7 triage line to call if any fevers or symptoms. ILD/Pneumonitis: we reviewed potential symptoms including cough, shortness, and fatigue.  VTE: reviewed signs of DVT such as leg swelling, redness, pain, or tenderness and signs of PE such as shortness of breath, rapid or irregular heartbeat, cough, chest pain, or lightheadedness. Reviewed to take the medication every 12 hours (with food sometimes can be easier on the stomach) and to take it at the same time every day. Hepatotoxicity:WNL Drug interactions with grapefruit products  Dosing Assessment Hepatic adjustments needed? No  Renal adjustments needed? No  Toxicity adjustments needed? No  The current dosing regimen is appropriate to continue at this time.  Follow-Up Plan Continue abemaciclib 50 mg by mouth every 12 hours Continue letrozole 2.5 mg by mouth daily Monitor serum creatinine. PCP lowered metformin dose to 500 mg BID and started patient on Jardiance. Labs, Dr. Al Pimple visit scheduled for 07/18/23 Ms. Yebra will contact Alan Ripper at [phone] when she is getting close to retirement  in July 2025 in case updates need to be made to her abemaciclib prescription.  Cecilie Kicks Squibb can follow up with clinical pharmacy as deemed necessary by Dr. Burnice Logan Iruku going forward   Lauren [name] participated in the discussion, expressed understanding, and voiced agreement with the above plan. All questions were answered to her satisfaction. The patient was advised to contact the clinic at [phone] with any questions or concerns prior to her return visit.   I spent 30 minutes assessing and educating the patient.  Derwin Reddy A. Odetta Pink, PharmD, BCOP, CPP  Anselm Lis, RPH-CPP, 05/23/2023  8:56 AM   **Disclaimer: This note was dictated with voice recognition software. Similar sounding words can inadvertently be transcribed and this note may contain transcription errors which may not have been corrected upon publication of note.**

## 2023-05-19 DIAGNOSIS — E119 Type 2 diabetes mellitus without complications: Secondary | ICD-10-CM | POA: Diagnosis not present

## 2023-05-22 ENCOUNTER — Ambulatory Visit: Payer: BC Managed Care – PPO | Attending: Surgery

## 2023-05-22 VITALS — Wt 206.2 lb

## 2023-05-22 DIAGNOSIS — Z483 Aftercare following surgery for neoplasm: Secondary | ICD-10-CM | POA: Insufficient documentation

## 2023-05-22 NOTE — Therapy (Signed)
 OUTPATIENT PHYSICAL THERAPY SOZO SCREENING NOTE   Patient Name: Lauren [name] MRN: [id number] DOB:Sep 12, 1955, 68 y.o., female Today's Date: 05/22/2023  PCP: Camie Patience, FNP REFERRING PROVIDER: Abigail Miyamoto, MD   PT End of Session - 05/22/23 [phone]     Visit Number 1   # unchanged due to screen only   PT Start Time 0942    PT Stop Time 0948    PT Time Calculation (min) 6 min    Activity Tolerance Patient tolerated treatment well    Behavior During Therapy [facility] for tasks assessed/performed             Past Medical History:  Diagnosis Date   Breast cancer (HCC)    Diabetes mellitus without complication (HCC)    Hyperlipidemia    Hypertension    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   not current problem, no hematologist   Past Surgical History:  Procedure Laterality Date   ABDOMINAL HYSTERECTOMY  11-18-1998   partial   BREAST BIOPSY Left 03/15/2022   [place] LT BREAST BX W LOC DEV 1ST LESION IMG BX SPEC US GUIDE 03/15/2022 [facility]   BREAST BIOPSY Left 03/15/2022   [place] LT BREAST BX W LOC DEV EA ADD LESION IMG BX SPEC US GUIDE 03/15/2022 [facility]   CHOLECYSTECTOMY  08/02/2011   Procedure: LAPAROSCOPIC CHOLECYSTECTOMY WITH INTRAOPERATIVE CHOLANGIOGRAM;  Surgeon: Valarie Merino, MD;  Location: [facility];  Service: General;  Laterality: N/A;   LAPAROSCOPIC GASTRIC BANDING  05/19/2008   LUMBAR DISC SURGERY  03/21/1996   MASTECTOMY W/ SENTINEL NODE BIOPSY Left 04/14/2022   Procedure: LEFT MASTECTOMY WITH SENTINEL NODE BIOPSY;  Surgeon: Abigail Miyamoto, MD;  Location: [facility];  Service: General;  Laterality: Left;   THYROIDECTOMY N/A 09/08/2015   Procedure: TOTAL THYROIDECTOMY;  Surgeon: Darnell Level, MD;  Location: [facility];  Service: General;  Laterality: N/A;   TONSILLECTOMY     Patient Active Problem List   Diagnosis Date Noted   Vitamin B deficiency 11/08/2022   Type 2 diabetes mellitus without complication, without  long-term current use of insulin (HCC) 11/08/2022   Pre-diabetes 11/08/2022   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   Memory impairment 11/08/2022   Hypomagnesemia 11/08/2022   Hypertension 11/08/2022   Hyperlipidemia 11/08/2022   History of papillary adenocarcinoma of thyroid 11/08/2022   History of ITP 11/08/2022   History of COVID-19 11/08/2022   History of adenomatous polyp of colon 11/08/2022   GERD (gastroesophageal reflux disease) 11/08/2022   Cholelithiasis 11/08/2022   BMI 40.0-44.9, adult (HCC) 11/08/2022   Aortic atherosclerosis (HCC) 11/08/2022   Genetic testing 03/31/2022   Malignant neoplasm of upper-outer quadrant of left breast in female, estrogen receptor positive (HCC) 03/21/2022   Disorder of tendon 02/17/2017   Toxic multinodular goiter 09/08/2015   Hyperthyroidism 09/04/2015   Multinodular goiter 04/29/2011   Chronic cholecystitis 04/29/2011   Lapband APS April 2010 03/18/2011   GIST (gastrointestinal stromal tumor), 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 It measures 9.2 cm of calcifications and is located in the upper outer quadrant. It is ER/PR positive and HER2 negative with a Ki67 of 30%.    PRECAUTIONS: left UE Lymphedema risk,   SUBJECTIVE: Pt returns for her 3 month L-Dex screen.  PAIN:  Are you having pain? No    Patient was assessed today using the SOZO machine to determine the lymphedema index score. This was compared to her baseline score. It was determined that she is NOT within the recommended range when compared to her baseline.She will resume wear of her compression sleeve that she got from [facility]. It is recommended she return in 1 month to be reassessed. If she  continues to measure outside the recommended range, physical therapy treatment will be recommended at that time and a referral requested.    L-DEX FLOWSHEETS - 05/22/23 0900       L-DEX LYMPHEDEMA SCREENING   Measurement Type Unilateral    L-DEX MEASUREMENT EXTREMITY Upper Extremity    POSITION  Standing    DOMINANT SIDE Left    At Risk Side Left    BASELINE SCORE (UNILATERAL) -3.2    L-DEX SCORE (UNILATERAL) 6.7    VALUE CHANGE (UNILAT) 9.9            P: Pt ot return in 1 month for recheck after daily wear of compression sleeve.   Hermenia Bers, PTA 05/22/2023, 9:49 AM

## 2023-05-23 ENCOUNTER — Inpatient Hospital Stay: Payer: BC Managed Care – PPO | Attending: Hematology and Oncology

## 2023-05-23 ENCOUNTER — Inpatient Hospital Stay: Payer: BC Managed Care – PPO | Admitting: Pharmacist

## 2023-05-23 VITALS — BP 147/71 | HR 76 | Temp 98.2°F | Resp 18 | Ht 66.0 in | Wt 206.3 lb

## 2023-05-23 DIAGNOSIS — Z170 Estrogen receptor positive status [ER+]: Secondary | ICD-10-CM

## 2023-05-23 DIAGNOSIS — C50412 Malignant neoplasm of upper-outer quadrant of left female breast: Secondary | ICD-10-CM | POA: Diagnosis not present

## 2023-05-23 LAB — CMP (CANCER CENTER ONLY)
ALT: 20 U/L (ref 0–44)
AST: 21 U/L (ref 15–41)
Albumin: 4.1 g/dL (ref 3.5–5.0)
Alkaline Phosphatase: 64 U/L (ref 38–126)
Anion gap: 8 (ref 5–15)
BUN: 18 mg/dL (ref 8–23)
CO2: 27 mmol/L (ref 22–32)
Calcium: 9.8 mg/dL (ref 8.9–10.3)
Chloride: 105 mmol/L (ref 98–111)
Creatinine: 1.4 mg/dL — ABNORMAL HIGH (ref 0.44–1.00)
GFR, Estimated: 41 mL/min — ABNORMAL LOW (ref >60)
Glucose, Bld: 148 mg/dL — ABNORMAL HIGH (ref 70–99)
Potassium: 3.8 mmol/L (ref 3.5–5.1)
Sodium: 140 mmol/L (ref 135–145)
Total Bilirubin: 0.6 mg/dL (ref 0.0–1.2)
Total Protein: 6.9 g/dL (ref 6.5–8.1)

## 2023-05-23 LAB — CBC WITH DIFFERENTIAL (CANCER CENTER ONLY)
Abs Immature Granulocytes: 0.02 10*3/uL (ref 0.00–0.07)
Basophils Absolute: 0.1 10*3/uL (ref 0.0–0.1)
Basophils Relative: 2 %
Eosinophils Absolute: 0.2 10*3/uL (ref 0.0–0.5)
Eosinophils Relative: 3 %
HCT: 34 % — ABNORMAL LOW (ref 36.0–46.0)
Hemoglobin: 11.7 g/dL — ABNORMAL LOW (ref 12.0–15.0)
Immature Granulocytes: 0 %
Lymphocytes Relative: 26 %
Lymphs Abs: 1.2 10*3/uL (ref 0.7–4.0)
MCH: 32.3 pg (ref 26.0–34.0)
MCHC: 34.4 g/dL (ref 30.0–36.0)
MCV: 93.9 fL (ref 80.0–100.0)
Monocytes Absolute: 0.4 10*3/uL (ref 0.1–1.0)
Monocytes Relative: 8 %
Neutro Abs: 2.7 10*3/uL (ref 1.7–7.7)
Neutrophils Relative %: 61 %
Platelet Count: 174 10*3/uL (ref 150–400)
RBC: 3.62 MIL/uL — ABNORMAL LOW (ref 3.87–5.11)
RDW: 12.4 % (ref 11.5–15.5)
WBC Count: 4.5 10*3/uL (ref 4.0–10.5)
nRBC: 0 % (ref 0.0–0.2)

## 2023-05-26 ENCOUNTER — Encounter: Payer: Self-pay | Admitting: Internal Medicine

## 2023-05-26 ENCOUNTER — Ambulatory Visit: Payer: BC Managed Care – PPO | Attending: Internal Medicine | Admitting: Internal Medicine

## 2023-05-26 VITALS — BP 128/84 | HR 78 | Ht 66.0 in | Wt 204.6 lb

## 2023-05-26 DIAGNOSIS — I10 Essential (primary) hypertension: Secondary | ICD-10-CM | POA: Diagnosis not present

## 2023-05-26 DIAGNOSIS — R002 Palpitations: Secondary | ICD-10-CM

## 2023-05-26 DIAGNOSIS — E119 Type 2 diabetes mellitus without complications: Secondary | ICD-10-CM

## 2023-05-26 DIAGNOSIS — Z170 Estrogen receptor positive status [ER+]: Secondary | ICD-10-CM

## 2023-05-26 DIAGNOSIS — I471 Supraventricular tachycardia, unspecified: Secondary | ICD-10-CM | POA: Diagnosis not present

## 2023-05-26 DIAGNOSIS — I700 Atherosclerosis of aorta: Secondary | ICD-10-CM

## 2023-05-26 DIAGNOSIS — C50412 Malignant neoplasm of upper-outer quadrant of left female breast: Secondary | ICD-10-CM

## 2023-05-26 DIAGNOSIS — E785 Hyperlipidemia, unspecified: Secondary | ICD-10-CM

## 2023-05-26 NOTE — Patient Instructions (Signed)
 Medication Instructions:  No Changes *If you need a refill on your cardiac medications before your next appointment, please call your pharmacy*  Lab Work: None  Follow-Up: At [facility], you and your health needs are our priority.  As part of our continuing mission to provide you with exceptional heart care, our providers are all part of one team.  This team includes your primary Cardiologist (physician) and Advanced Practice Providers or APPs (Physician Assistants and Nurse Practitioners) who all work together to provide you with the care you need, when you need it.  Your next appointment:   1 year(s)  Provider:   Parke Poisson, MD     Other Instructions Please call us or send a MyChart message with any Cardiology related questions/concerns.  [phone].  Thank you!         Valet parking services will be available as well.

## 2023-05-26 NOTE — Progress Notes (Addendum)
 Cardiology Office Note:  .   Date:  05/26/2023  ID:  Lauren [name], DOB 1955-03-06, MRN [id number] PCP: Camie Patience, FNP  [facility] Providers Cardiologist:  Parke Poisson, MD    History of Present Illness: .   Lauren [name] is a 68 y.o. female.  Discussed the use of AI scribe software for clinical note transcription with the patient, who gave verbal consent to proceed.  History of Present Illness The patient, with a history of hyperlipidemia, hypertension, and idiopathic thrombocytopenic purpura (ITP), presents for a follow-up visit. She was previously seen for palpitations, which were found to be episodes of supraventricular tachycardia (SVT) on cardiac monitoring. The patient was started on metoprolol 50mg  daily, which led to an improvement in her symptoms.  In addition to her cardiovascular issues, the patient has a history of breast cancer. She underwent a mastectomy and received radiation therapy. She is currently on letrozole and Verzenio as part of her antiestrogen oral therapy.  The patient also has diabetes, which is being managed with Jardiance. She reports that her last A1c was 6.3, indicating good control of her diabetes. However, she has noticed fluctuations in her kidney function, with her creatinine and GFR levels going up and down.  The patient's cholesterol levels were last checked in September of the previous year. Her LDL was 31, which is excellent, but her triglycerides were slightly elevated at 209. The patient reports that she always fasts before having her cholesterol labs drawn.  The patient occasionally experiences a fast heartbeat, but it is not a daily occurrence and it resolves on its own. She continues to take metoprolol 50mg  daily, which she believes is still effective.    ROS: negative except per HPI above.  Studies Reviewed: Marland Kitchen   EKG Interpretation Date/Time:  Friday May 26 2023 09:01:37 EDT Ventricular Rate:  78 PR  Interval:  160 QRS Duration:  72 QT Interval:  368 QTC Calculation: 419 R Axis:   75  Text Interpretation: Normal sinus rhythm Nonspecific ST and T wave abnormality Confirmed by Weston Brass ([id number]) on 05/26/2023 9:13:16 AM    Results LABS HbA1c: 6.3 LDL: 31 (September 2024) Triglycerides: 209 (September 2024) Creatinine: 1.4 GFR: 41 Hb: 11.7  RADIOLOGY Chest CT: small calcium deposit in right coronary artery, minimal aortic atherosclerosis (November 2023)  DIAGNOSTIC Cardiac monitor: eleven episodes of SVT, longest sixteen seconds, rare ectopy Transthoracic echo: grossly normal, grade two diastolic dysfunction, likely indeterminate EKG: Normal (May 26 2023)  PATHOLOGY Radiation therapy: completed (March-June 2024) Risk Assessment/Calculations:             Physical Exam:   VS:  BP 128/84   Pulse 78   Ht 5\' 6"  (1.676 m)   Wt 204 lb 9.6 oz (92.8 kg)   SpO2 99%   BMI 33.02 kg/m    Wt Readings from Last 3 Encounters:  05/26/23 204 lb 9.6 oz (92.8 kg)  05/23/23 206 lb 4.8 oz (93.6 kg)  05/22/23 206 lb 4 oz (93.6 kg)     Physical Exam GENERAL: Alert, cooperative, well developed, no acute distress. HEENT: Normocephalic, normal oropharynx, moist mucous membranes. CHEST: Clear to auscultation bilaterally, no wheezes, rhonchi, or crackles. CARDIOVASCULAR: Normal heart rate and rhythm, S1 and S2 normal without murmurs. ABDOMEN: Soft, non-tender, non-distended, without organomegaly, normal bowel sounds. EXTREMITIES: Mild ankle edema, no cyanosis. NEUROLOGICAL: Cranial nerves grossly intact, moves all extremities without gross motor or sensory deficit.   ASSESSMENT AND PLAN: .    Assessment  and Plan Assessment & Plan Supraventricular Tachycardia (SVT) Palpitations Eleven episodes recorded, longest sixteen seconds. Metoprolol improved symptoms, occasional non-concerning palpitations. Last echocardiogram normal with grade two diastolic dysfunction. Current EKG  nonspecific, similar to previous.  - Continue metoprolol 50 mg daily. - Consider updating echocardiogram if symptoms change.  Diabetes Mellitus On Jardiance for diabetes. Last A1c 6.3, good control. Kidney function fluctuating, creatinine 1.4, GFR 41. - Continue Jardiance 10 mg daily. - Monitor kidney function regularly.  Hyperlipidemia LDL 31, excellent control. Triglycerides 209 despite fasting. CT scan shows minimal calcium deposits, aortic atherosclerosis. Dietary changes suggested for triglycerides. - Recheck cholesterol levels with PCP. - Focus on dietary changes to reduce triglycerides. - Continue rosuvastatin 40 mg daily.  Hypertension On benazepril HCTZ 20-25 mg daily for blood pressure management. - Continue benazepril HCTZ 20-25 mg daily.  Breast Cancer Underwent mastectomy, positive nodes, received radiation therapy. On letrozole and Verzenio. Last oncology visit February 2025. - letrozole and Verzenio as prescribed by oncology.  Follow-up Well-managed with no urgent concerns. - Follow up in one year. - Contact the clinic if symptoms change or for any concerns.      Weston Brass, MD, [facility]

## 2023-06-05 ENCOUNTER — Other Ambulatory Visit: Payer: Self-pay | Admitting: Hematology and Oncology

## 2023-06-22 ENCOUNTER — Ambulatory Visit: Attending: Surgery

## 2023-06-22 VITALS — Wt 206.2 lb

## 2023-06-22 DIAGNOSIS — Z483 Aftercare following surgery for neoplasm: Secondary | ICD-10-CM | POA: Insufficient documentation

## 2023-06-22 NOTE — Therapy (Signed)
 OUTPATIENT PHYSICAL THERAPY SOZO SCREENING NOTE   Patient Name: Lauren [name] MRN: [id number] DOB:1955-08-18, 68 y.o., female Today's Date: 06/22/2023  PCP: Bufford Carne, FNP REFERRING PROVIDER: Oza Blumenthal, MD   PT End of Session - 06/22/23 1205     Visit Number 1   # unchnaged due to screen only   PT Start Time 1204    PT Stop Time 1208    PT Time Calculation (min) 4 min    Activity Tolerance Patient tolerated treatment well    Behavior During Therapy [facility] for tasks assessed/performed             Past Medical History:  Diagnosis Date   Breast cancer (HCC)    Diabetes mellitus without complication (HCC)    Hyperlipidemia    Hypertension    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   not current problem, no hematologist   Past Surgical History:  Procedure Laterality Date   ABDOMINAL HYSTERECTOMY  11-18-1998   partial   BREAST BIOPSY Left 03/15/2022   US  LT BREAST BX W LOC DEV 1ST LESION IMG BX SPEC US  GUIDE 03/15/2022 [facility]   BREAST BIOPSY Left 03/15/2022   US  LT BREAST BX W LOC DEV EA ADD LESION IMG BX SPEC US  GUIDE 03/15/2022 [facility]   CHOLECYSTECTOMY  08/02/2011   Procedure: LAPAROSCOPIC CHOLECYSTECTOMY WITH INTRAOPERATIVE CHOLANGIOGRAM;  Surgeon: Azucena Bollard, MD;  Location: [facility];  Service: General;  Laterality: N/A;   LAPAROSCOPIC GASTRIC BANDING  05/19/2008   LUMBAR DISC SURGERY  03/21/1996   MASTECTOMY W/ SENTINEL NODE BIOPSY Left 04/14/2022   Procedure: LEFT MASTECTOMY WITH SENTINEL NODE BIOPSY;  Surgeon: Oza Blumenthal, MD;  [facility];  Service: General;  Laterality: Left;   THYROIDECTOMY N/A 09/08/2015   Procedure: TOTAL THYROIDECTOMY;  Surgeon: Oralee Billow, MD;  Location: [facility];  Service: General;  Laterality: N/A;   TONSILLECTOMY     Patient Active Problem List   Diagnosis Date Noted   Vitamin B deficiency 11/08/2022   Type 2 diabetes mellitus without complication, without  long-term current use of insulin  (HCC) 11/08/2022   Pre-diabetes 11/08/2022   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   Memory impairment 11/08/2022   Hypomagnesemia 11/08/2022   Hypertension 11/08/2022   Hyperlipidemia 11/08/2022   History of papillary adenocarcinoma of thyroid  11/08/2022   History of ITP 11/08/2022   History of COVID-19 11/08/2022   History of adenomatous polyp of colon 11/08/2022   GERD (gastroesophageal reflux disease) 11/08/2022   Cholelithiasis 11/08/2022   BMI 40.0-44.9, adult (HCC) 11/08/2022   Aortic atherosclerosis (HCC) 11/08/2022   Genetic testing 03/31/2022   Malignant neoplasm of upper-outer quadrant of left breast in female, estrogen receptor positive (HCC) 03/21/2022   Disorder of tendon 02/17/2017   Toxic multinodular goiter 09/08/2015   Hyperthyroidism 09/04/2015   Multinodular goiter 04/29/2011   Chronic cholecystitis 04/29/2011   Lapband APS April 2010 03/18/2011   GIST (gastrointestinal stromal tumor), 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 It measures 9.2 cm of calcifications and is located in the upper outer quadrant. It is ER/PR positive and HER2 negative with a Ki67 of 30%.   PRECAUTIONS: left UE Lymphedema risk, None  SUBJECTIVE: Pt returns for her 1 month reassess after having a high  change from baseline.   PAIN:  Are you having pain? No  SOZO SCREENING: Patient was assessed today using the SOZO machine to determine the lymphedema index score. This was compared to her baseline score. It was determined that she is within the recommended range when compared to her baseline and no further action is needed at this time. She will continue SOZO screenings. These are done  every 3 months for 2 years post operatively followed by every 6 months for 2 years, and then annually.   L-DEX FLOWSHEETS - 06/22/23 1200       L-DEX LYMPHEDEMA SCREENING   Measurement Type Unilateral    L-DEX MEASUREMENT EXTREMITY Upper Extremity    POSITION  Standing    DOMINANT SIDE Left    At Risk Side Left    BASELINE SCORE (UNILATERAL) -3.2    L-DEX SCORE (UNILATERAL) -4.9    VALUE CHANGE (UNILAT) -1.7             P: Resume every 3 month L-Dex screens.  Denyce Flank, PTA 06/22/2023, 12:12 PM

## 2023-06-23 DIAGNOSIS — I10 Essential (primary) hypertension: Secondary | ICD-10-CM | POA: Diagnosis not present

## 2023-06-23 DIAGNOSIS — H26491 Other secondary cataract, right eye: Secondary | ICD-10-CM | POA: Diagnosis not present

## 2023-06-26 ENCOUNTER — Telehealth: Payer: Self-pay | Admitting: Pharmacy Technician

## 2023-06-26 NOTE — Telephone Encounter (Signed)
 Received a call from this pt in regard to a bill she received recently about her genetic testing that was done over a year ago. She was wondering if it's correct and trying to figure out what she has to pay. Please direct to the appropriate person if necessary. I advised her to call the number on the bill. It looks like from the notes that she could have a responsibility of up to $100 and that's what it's for.

## 2023-07-14 DIAGNOSIS — H26492 Other secondary cataract, left eye: Secondary | ICD-10-CM | POA: Diagnosis not present

## 2023-07-17 ENCOUNTER — Telehealth: Payer: Self-pay

## 2023-07-17 NOTE — Telephone Encounter (Signed)
 Verbally confirmed appts for 6/3

## 2023-07-18 ENCOUNTER — Inpatient Hospital Stay: Payer: BC Managed Care – PPO

## 2023-07-18 ENCOUNTER — Other Ambulatory Visit (HOSPITAL_COMMUNITY): Payer: Self-pay

## 2023-07-18 ENCOUNTER — Inpatient Hospital Stay: Payer: BC Managed Care – PPO | Attending: Hematology and Oncology | Admitting: Hematology and Oncology

## 2023-07-18 ENCOUNTER — Encounter: Payer: Self-pay | Admitting: Hematology and Oncology

## 2023-07-18 DIAGNOSIS — Z79811 Long term (current) use of aromatase inhibitors: Secondary | ICD-10-CM | POA: Diagnosis not present

## 2023-07-18 DIAGNOSIS — Z923 Personal history of irradiation: Secondary | ICD-10-CM | POA: Insufficient documentation

## 2023-07-18 DIAGNOSIS — C50412 Malignant neoplasm of upper-outer quadrant of left female breast: Secondary | ICD-10-CM

## 2023-07-18 DIAGNOSIS — Z170 Estrogen receptor positive status [ER+]: Secondary | ICD-10-CM | POA: Insufficient documentation

## 2023-07-18 LAB — CBC WITH DIFFERENTIAL (CANCER CENTER ONLY)
Abs Immature Granulocytes: 0.01 10*3/uL (ref 0.00–0.07)
Basophils Absolute: 0.1 10*3/uL (ref 0.0–0.1)
Basophils Relative: 2 %
Eosinophils Absolute: 0.2 10*3/uL (ref 0.0–0.5)
Eosinophils Relative: 4 %
HCT: 36.5 % (ref 36.0–46.0)
Hemoglobin: 12.8 g/dL (ref 12.0–15.0)
Immature Granulocytes: 0 %
Lymphocytes Relative: 28 %
Lymphs Abs: 1.3 10*3/uL (ref 0.7–4.0)
MCH: 32.7 pg (ref 26.0–34.0)
MCHC: 35.1 g/dL (ref 30.0–36.0)
MCV: 93.4 fL (ref 80.0–100.0)
Monocytes Absolute: 0.4 10*3/uL (ref 0.1–1.0)
Monocytes Relative: 9 %
Neutro Abs: 2.6 10*3/uL (ref 1.7–7.7)
Neutrophils Relative %: 57 %
Platelet Count: 177 10*3/uL (ref 150–400)
RBC: 3.91 MIL/uL (ref 3.87–5.11)
RDW: 12.8 % (ref 11.5–15.5)
WBC Count: 4.6 10*3/uL (ref 4.0–10.5)
nRBC: 0 % (ref 0.0–0.2)

## 2023-07-18 LAB — CMP (CANCER CENTER ONLY)
ALT: 20 U/L (ref 0–44)
AST: 18 U/L (ref 15–41)
Albumin: 4.1 g/dL (ref 3.5–5.0)
Alkaline Phosphatase: 57 U/L (ref 38–126)
Anion gap: 9 (ref 5–15)
BUN: 19 mg/dL (ref 8–23)
CO2: 26 mmol/L (ref 22–32)
Calcium: 9.8 mg/dL (ref 8.9–10.3)
Chloride: 106 mmol/L (ref 98–111)
Creatinine: 1.11 mg/dL — ABNORMAL HIGH (ref 0.44–1.00)
GFR, Estimated: 54 mL/min — ABNORMAL LOW (ref >60)
Glucose, Bld: 153 mg/dL — ABNORMAL HIGH (ref 70–99)
Potassium: 3.9 mmol/L (ref 3.5–5.1)
Sodium: 141 mmol/L (ref 135–145)
Total Bilirubin: 0.6 mg/dL (ref 0.0–1.2)
Total Protein: 6.9 g/dL (ref 6.5–8.1)

## 2023-07-18 MED ORDER — ABEMACICLIB 50 MG PO TABS: 50.0000 mg | ORAL_TABLET | Freq: Two times a day (BID) | ORAL | 2 refills | Status: DC

## 2023-07-18 NOTE — Assessment & Plan Note (Addendum)
 This is a very pleasant 68 yr old female patient with PMH significant for HTN, obesity, hyperlipidemia referred to breast [facility] for recommendations.  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   Oncotype of 10, no role for chemo She completed radiation on 07/21/2022 She is now on verzenio  and letrozole .  Assessment and Plan Assessment & Plan Breast cancer Managed with Verzenio  and letrozole . Current Verzenio  dose well-tolerated. Blood counts stable. Metabolic panel pending. - Refill Verzenio  for two months. - Ensure letrozole  prescription valid until October. - Labs from today reviewed, no concerns. - Schedule next visit in August with Autry Legions - No concerns on exam today.

## 2023-07-18 NOTE — Progress Notes (Signed)
 [facility] CONSULT NOTE  Patient Care Team: Bufford Carne, FNP as PCP - General (Family Medicine) Euell Herrlich, MD as PCP - Cardiology (Cardiology) Oza Blumenthal, MD as Consulting Physician (General Surgery) Murleen Arms, MD as Consulting Physician (Hematology and Oncology) Johna Myers, MD as Consulting Physician (Radiation Oncology)  CHIEF COMPLAINTS/PURPOSE OF CONSULTATION:  Newly diagnosed breast cancer  HISTORY OF PRESENTING ILLNESS:  Lauren [name] 68 y.o. female is here because of recent diagnosis of left breast cancer.  I reviewed her records extensively and collaborated the history with the patient.  SUMMARY OF ONCOLOGIC HISTORY: Oncology History  Malignant neoplasm of upper-outer quadrant of left breast in female, estrogen receptor positive (HCC)  03/04/2022 Mammogram   Screening mammogram showed calcs and possible distortion in the left breast. Diag mammogram showed suspicious calcs and non mass finding in the 2 0 clock location of the left breast warranting further tissue diagnosis. No left axillary adenopathy   03/10/2022 Breast US    Suspicious calcifications and non mass enhancement measuring approximately 4.9 cm in largest dimension.   03/23/2022 Cancer Staging   Staging form: Breast, AJCC 8th Edition - Pathologic: Stage IA (pT1b, pN1a, cM0, G2, ER+, PR+, HER2-) - Signed by Murleen Arms, MD on 05/06/2022 Histologic grading system: 3 grade system   03/25/2022 Pathology Results   Pathology showed grade 2 IDC, ER positive, PR positive, her 2 neg, Ki 67 30%   03/31/2022 Genetic Testing   Negative. The Multi-Cancer + RNA Panel offered by [facility] includes sequencing and/or deletion/duplication analysis of the following 70 genes:  AIP*, ALK, APC*, ATM*, AXIN2*, BAP1*, BARD1*, BLM*, BMPR1A*, BRCA1*, BRCA2*, BRIP1*, CDC73*, CDH1*, CDK4, CDKN1B*, CDKN2A, CHEK2*, CTNNA1*, DICER1*, EPCAM (del/dup only), EGFR, FH*, FLCN*, GREM1 (promoter dup only),  HOXB13, KIT, LZTR1, MAX*, MBD4, MEN1*, MET, MITF, MLH1*, MSH2*, MSH3*, MSH6*, MUTYH*, NF1*, NF2*, NTHL1*, PALB2*, PDGFRA, PMS2*, POLD1*, POLE*, POT1*, PRKAR1A*, PTCH1*, PTEN*, RAD51C*, RAD51D*, RB1*, RET, SDHA* (sequencing only), SDHAF2*, SDHB*, SDHC*, SDHD*, SMAD4*, SMARCA4*, SMARCB1*, SMARCE1*, STK11*, SUFU*, TMEM127*, TP53*, TSC1*, TSC2*, VHL*. RNA analysis is performed for * genes.   04/14/2022 Surgery   Left breast mastectomy: IDC, grade 2, 0.8cm, 3/17 LN positive for macrometastases, margins negative.    05/09/2022 Oncotype testing   10/12%   06/06/2022 - 07/21/2022 Radiation Therapy   Plan Name: CW_L_BO_BH Site: Chest Wall, Left Technique: 3D Mode: Photon Dose Per Fraction: 1.8 Gy Prescribed Dose (Delivered / Prescribed): 25.2 Gy / 25.2 Gy Prescribed Fxs (Delivered / Prescribed): 14 / 14   Plan Name: CW_PAB_SCV_BH Site: Chest Wall, Left Technique: 3D Mode: Photon Dose Per Fraction: 1.8 Gy Prescribed Dose (Delivered / Prescribed): 50.4 Gy / 50.4 Gy Prescribed Fxs (Delivered / Prescribed): 28 / 28   Plan Name: CW_L_Bst_BO Site: Chest Wall, Left Technique: Electron Mode: Electron Dose Per Fraction: 2 Gy Prescribed Dose (Delivered / Prescribed): 10 Gy / 10 Gy Prescribed Fxs (Delivered / Prescribed): 5 / 5   Plan Name: CW_L_BH Site: Chest Wall, Left Technique: 3D Mode: Photon Dose Per Fraction: 1.8 Gy Prescribed Dose (Delivered / Prescribed): 25.2 Gy / 25.2 Gy Prescribed Fxs (Delivered / Prescribed): 14 / 14   10/04/2022 -  Anti-estrogen oral therapy   Letrozole     She is now status post left mastectomy, extensive DCIS intermediate nuclear grade, invasive moderately differentiated adenocarcinoma grade 2, invasive tumor measures 8 mm in greatest dimension.  Margins free.  Left axillary lymph nodes regional resection which showed 3 of 17 lymph nodes with metastatic carcinoma showing marked fatty replacement.  Negative for extracapsular extension.  Prior prognostic showed ER  +95% strong staining PR 90% moderate to strong staining HER2 negative 1+ Ki-67 of 30%. She completed adjuvant radiation on 07/21/2022. She is now on adjuvant verzenio  and letrozole .  Discussed the use of AI scribe software for clinical note transcription with the patient, who gave verbal consent to proceed.  History of Present Illness    Lauren [name] is a 68 year old female with breast cancer who presents for follow up on verzenio  and letrozole .  Discussed the use of AI scribe software for clinical note transcription with the patient, who gave verbal consent to proceed.  History of Present Illness Lauren [name] is a 68 year old female with breast cancer who presents for a follow-up visit.  She is currently on Verzenio  50 mg twice daily and letrozole  for breast cancer treatment. Initially, she experienced significant diarrhea with Verzenio , but symptoms improved after the dose was adjusted. No current gastrointestinal symptoms such as nausea or diarrhea. She is also taking B12, thyroxine, and vitamin D3, which help with her energy levels.  She needs a refill of Verzenio  and possibly letrozole  due to transitioning to a new insurance plan in August. She plans to retire at the end of July and will switch to an [employer] plan, which may affect her prescription coverage.  No new health issues since her last visit and states that everything is 'going pretty steady.' Her blood work is pending. She mentions some swelling in her feet, which she noticed while sitting at her granddaughter's practice, but attributes it to her baseline condition.  She discusses her upcoming retirement and her plans to spend more time with her granddaughters, one of whom is starting at [employer] and plays basketball, while the younger one is involved in gymnastics.   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   not current problem, no hematologist    SURGICAL HISTORY: Past Surgical History:  Procedure Laterality Date   ABDOMINAL HYSTERECTOMY  11-18-1998   partial   BREAST BIOPSY Left 03/15/2022   US  LT BREAST BX W LOC DEV 1ST LESION IMG BX SPEC US  GUIDE 03/15/2022 [facility]   BREAST BIOPSY Left 03/15/2022   US  LT BREAST BX W LOC DEV EA ADD LESION IMG BX SPEC US  GUIDE 03/15/2022 [facility]   CHOLECYSTECTOMY  08/02/2011   Procedure: LAPAROSCOPIC CHOLECYSTECTOMY WITH INTRAOPERATIVE CHOLANGIOGRAM;  Surgeon: Azucena Bollard, MD;  Location: [facility];  Service: General;  Laterality: N/A;   LAPAROSCOPIC GASTRIC BANDING  05/19/2008   LUMBAR DISC SURGERY  03/21/1996   MASTECTOMY W/ SENTINEL NODE BIOPSY Left 04/14/2022   Procedure: LEFT MASTECTOMY WITH SENTINEL NODE BIOPSY;  Surgeon: Oza Blumenthal, MD;  Location: [facility];  Service: General;  Laterality: Left;   THYROIDECTOMY N/A 09/08/2015   Procedure: TOTAL THYROIDECTOMY;  Surgeon: Oralee Billow, MD;  Location: [facility];  Service: General;  Laterality: N/A;   TONSILLECTOMY      SOCIAL HISTORY: Social History   Socioeconomic History   Marital status: Married    Spouse name: Not on file   Number of children: Not on file   Years of education: Not on file   Highest education level: Not on file  Occupational History   Not on file  Tobacco Use   Smoking status: Never   Smokeless tobacco: Never  Substance and Sexual Activity   Alcohol use: No   Drug use: No   Sexual activity: Yes  Other Topics Concern   Not on file  Social History Narrative   Not on file   Social Drivers of Health   Financial Resource Strain: Low Risk  (03/23/2022)   Overall Financial Resource Strain (CARDIA)    Difficulty of Paying Living Expenses: Not hard at all  Food Insecurity: No Food Insecurity  (03/23/2022)   Hunger Vital Sign    Worried About Running Out of Food in the Last Year: Never true    Ran Out of Food in the Last Year: Never true  Transportation Needs: No Transportation Needs (03/23/2022)   PRAPARE - Administrator, Civil Service (Medical): No    Lack of Transportation (Non-Medical): No  Physical Activity: Not on file  Stress: Not on file  Social Connections: Not on file  Intimate Partner Violence: Not on file    FAMILY HISTORY: Family History  Problem Relation Age of Onset   Colon polyps Mother    Heart disease Mother    Diabetes Father    Heart disease Father    Breast cancer Maternal Aunt 41 - 69   Ovarian cancer Paternal Aunt    Bladder Cancer Paternal Uncle        paternal half-uncle   Breast cancer Cousin 88 - 34    ALLERGIES:  has no known allergies.  MEDICATIONS:  Current Outpatient Medications  Medication Sig Dispense Refill   abemaciclib  (VERZENIO ) 50 MG tablet Take 1 tablet (50 mg total) by mouth 2 (two) times daily. 56 tablet 2   ACCU-CHEK GUIDE test strip check sugar 3x daily In Vitro three times daily for 90 days     Accu-Chek Softclix Lancets lancets USE 1 TO CHECK GLUCOSE THREE TIMES DAILY     aspirin 81 MG tablet Take 81 mg by mouth daily.      benazepril -hydrochlorthiazide (LOTENSIN  HCT) 20-25 MG tablet Take 1 tablet by mouth daily.     Blood Glucose Monitoring Suppl (ACCU-CHEK GUIDE) w/Device KIT check glucose daily and as needed for 365 days     Cyanocobalamin  (B-12) 500 MCG SUBL Place 1 tablet under the tongue daily.     dicyclomine  (BENTYL ) 10 MG capsule Take 1 capsule (10 mg total) by mouth 2 (two) times daily as needed for spasms. 30 capsule 0   docusate sodium (COLACE) 100 MG capsule Take 100 mg by mouth daily.     empagliflozin (JARDIANCE) 10 MG TABS tablet Take 10 mg by mouth daily.     EUTHYROX  175 MCG tablet Take 175 mcg by mouth daily before breakfast.     letrozole  (FEMARA ) 2.5 MG tablet Take 1 tablet by mouth once  daily 90 tablet 3   metFORMIN (GLUCOPHAGE) 500 MG tablet 500 mg 2 (two) times daily with a meal.     metoprolol  succinate (TOPROL  XL) 50 MG 24 hr tablet Take 1 tablet (50 mg total) by mouth daily. Take with or immediately following a meal. 90 tablet 2   nystatin cream (MYCOSTATIN)      ondansetron  (ZOFRAN ) 8 MG tablet Take 1 tablet (8 mg total) by mouth every 8 (eight) hours as needed for nausea or vomiting. 30 tablet 2   rosuvastatin  (CRESTOR ) 40 MG tablet Take 1 tablet (40 mg total) by mouth daily. 90 tablet  2   terbinafine (LAMISIL) 250 MG tablet      triamcinolone (KENALOG) 0.025 % cream      VITAMIN D3 1.25 MG (50000 UT) capsule Take 50,000 Units by mouth once a week.     No current facility-administered medications for this visit.   REVIEW OF SYSTEMS:   Constitutional: Denies fevers, chills or abnormal night sweats Eyes: Denies blurriness of vision, double vision or watery eyes Ears, nose, mouth, throat, and face: Denies mucositis or sore throat Respiratory: Denies cough, dyspnea or wheezes Cardiovascular: Denies palpitation, chest discomfort or lower extremity swelling Gastrointestinal:  Denies nausea, heartburn or change in bowel habits Skin: Denies abnormal skin rashes Lymphatics: Denies new lymphadenopathy or easy bruising Neurological:Denies numbness, tingling or new weaknesses Behavioral/Psych: Mood is stable, no new changes  Breast: Denies any palpable lumps or discharge All other systems were reviewed with the patient and are negative.  PHYSICAL EXAMINATION: ECOG PERFORMANCE STATUS: 0 - Asymptomatic  Vitals:   07/18/23 0848 07/18/23 0849  BP: (!) 142/80 (!) 156/73  Pulse: 74   Resp: 18   Temp: 97.7 F (36.5 C)   SpO2: 100%     Filed Weights   07/18/23 0848  Weight: 205 lb 3.2 oz (93.1 kg)    Physical Exam Constitutional:      Appearance: Normal appearance.  Cardiovascular:     Rate and Rhythm: Normal rate and regular rhythm.     Pulses: Normal pulses.      Heart sounds: Normal heart sounds.  Pulmonary:     Effort: Pulmonary effort is normal.     Breath sounds: Normal breath sounds.  Abdominal:     General: Abdomen is flat. Bowel sounds are normal. There is no distension.     Palpations: There is no mass.  Musculoskeletal:     Cervical back: Normal range of motion. No rigidity.  Lymphadenopathy:     Cervical: No cervical adenopathy.  Skin:    General: Skin is warm and dry.  Neurological:     General: No focal deficit present.     Mental Status: She is alert.  Psychiatric:        Mood and Affect: Mood normal.      LABORATORY DATA:  I have reviewed the data as listed Lab Results  Component Value Date   WBC 4.6 07/18/2023   HGB 12.8 07/18/2023   HCT 36.5 07/18/2023   MCV 93.4 07/18/2023   PLT 177 07/18/2023   Lab Results  Component Value Date   NA 141 07/18/2023   K 3.9 07/18/2023   CL 106 07/18/2023   CO2 26 07/18/2023    RADIOGRAPHIC STUDIES: I have personally reviewed the radiological reports and agreed with the findings in the report.  ASSESSMENT AND PLAN:   Malignant neoplasm of upper-outer quadrant of left breast in female, estrogen receptor positive (HCC) This is a very pleasant 68 yr old female patient with PMH significant for HTN, obesity, hyperlipidemia referred to breast [facility] for recommendations.  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   Oncotype of 10, no role for chemo She completed radiation on 07/21/2022 She is now on verzenio  and letrozole .  Assessment and Plan Assessment & Plan Breast cancer Managed with Verzenio  and letrozole . Current Verzenio  dose well-tolerated. Blood counts stable. Metabolic panel pending. - Refill Verzenio  for two months. - Ensure letrozole  prescription valid until  October. - Labs from today reviewed, no concerns. -  Schedule next visit in August with Autry Legions - No concerns on exam today.     Total time spent: 20 minutes including history, physical exam, review of records, counseling and coordination of care, All questions were answered. The patient knows to call the clinic with any problems, questions or concerns.    Murleen Arms, MD 07/18/23

## 2023-08-22 DIAGNOSIS — Z7984 Long term (current) use of oral hypoglycemic drugs: Secondary | ICD-10-CM | POA: Diagnosis not present

## 2023-08-22 DIAGNOSIS — N1831 Chronic kidney disease, stage 3a: Secondary | ICD-10-CM | POA: Diagnosis not present

## 2023-08-22 DIAGNOSIS — E1122 Type 2 diabetes mellitus with diabetic chronic kidney disease: Secondary | ICD-10-CM | POA: Diagnosis not present

## 2023-09-19 NOTE — Progress Notes (Unsigned)
 [facility]       Telephone: [phone]?Fax: [phone]   Oncology Clinical Pharmacist Practitioner Progress Note  Lauren [name] was contacted via in-person to discuss her chemotherapy regimen for abemaciclib  which they receive under the care of Dr. Amber Stalls.  Current treatment regimen and start date Abemaciclib  (12/19/22) 50 mg BID (01/16/23) 100 mg BID (12/19/22) - stopped d/t GI side effects  Interval History She continues on abemaciclib  50 mg by mouth every 12 hours on days 1 to 28 of a 28-day cycle. This is being given in combination with letrozole . Therapy is planned to continue until two years in the adjuvant setting per the monarchE trial data. She last saw clinical pharmacy on 05/23/23 and Dr. Stalls on 07/18/23. She will see Dr. Stalls again on 11/07/23  Response to Therapy Overall, she is doing quite well. She states her hot flashes and difficulties falling asleep have increased. We discussed strategies to cope with both. She is going to monitor symptoms for now. We discussed possibly starting venlafaxine or fezolinetant if needed at a later time. We also reviewed sleep hygiene habits.  Her PCP started her on Jardiance and decreased her metformin to 500 mg PO BID. She will likely see clinical pharmacy again in November. Her insurance is now Medicare and [employer] as of 09/15/23. Sent this information to Cassie CPhT in case updates need to be made with her abemaciclib  prescription.   Labs, vitals, treatment parameters, and manufacturer guidelines assessing toxicity were reviewed with Grayce VEAR Lage today. Based on these values, patient is in agreement to continue abemaciclib  therapy at this time.  Allergies No Known Allergies  Vitals    09/20/2023    9:24 AM 07/18/2023    8:49 AM 07/18/2023    8:48 AM  Oncology Vitals  Weight 92.67 kg  93.078 kg  Weight (lbs) 204 lbs 5 oz  205 lbs 3 oz  BMI 32.97 kg/m2  33.12 kg/m2  Temp 97.3 F (36.3 C)  97.7 F (36.5 C)   Pulse Rate 67  74  BP 136/77 156/73 142/80  Resp 17  18  SpO2 100 %  100 %  BSA (m2) 2.08 m2  2.08 m2    Laboratory Data    Latest Ref Rng & Units 09/20/2023    8:45 AM 07/18/2023    8:33 AM 05/23/2023    8:41 AM  CBC EXTENDED  WBC 4.0 - 10.5 K/uL 4.5  4.6  4.5   RBC 3.87 - 5.11 MIL/uL 3.92  3.91  3.62   Hemoglobin 12.0 - 15.0 g/dL 87.2  87.1  88.2   HCT 36.0 - 46.0 % 36.8  36.5  34.0   Platelets 150 - 400 K/uL 162  177  174   NEUT# 1.7 - 7.7 K/uL 2.5  2.6  2.7   Lymph# 0.7 - 4.0 K/uL 1.4  1.3  1.2        Latest Ref Rng & Units 09/20/2023    8:45 AM 07/18/2023    8:33 AM 05/23/2023    8:41 AM  CMP  Glucose 70 - 99 mg/dL 861  846  851   BUN 8 - 23 mg/dL 23  19  18    Creatinine 0.44 - 1.00 mg/dL 8.83  8.88  8.59   Sodium 135 - 145 mmol/L 141  141  140   Potassium 3.5 - 5.1 mmol/L 3.9  3.9  3.8   Chloride 98 - 111 mmol/L 106  106  105  CO2 22 - 32 mmol/L 29  26  27    Calcium  8.9 - 10.3 mg/dL 9.6  9.8  9.8   Total Protein 6.5 - 8.1 g/dL 6.5  6.9  6.9   Total Bilirubin 0.0 - 1.2 mg/dL 0.7  0.6  0.6   Alkaline Phos 38 - 126 U/L 68  57  64   AST 15 - 41 U/L 18  18  21    ALT 0 - 44 U/L 21  20  20      Adverse Effects Assessment Serum creatinine: stable to slightly increased Hot flashes: will monitor, may trial venlafaxine or fezolinetant in the future Sleep: reviewed sleep habits and may trial melatonin at a later time  Adherence Assessment Ennifer H Murdy reports missing 2 doses over the past 8 weeks.   Reason for missed dose: forgot Patient was re-educated on importance of adherence.   Access Assessment TEASIA ZAPF is currently receiving her abemaciclib  through [employer] concerns:  New insurance is Medicare and [employer] as of 09/15/23. Told Cassie.  Medication Reconciliation The patient's medication list was reviewed today with the patient? Yes New medications or herbal supplements have recently been started? Yes, Jardiance, updated metformin to  500 mg PO BID Any medications have been discontinued? No  The medication list was updated and reconciled based on the patient's most recent medication list in the electronic medical record (EMR) including herbal products and OTC medications.   Medications Current Outpatient Medications  Medication Sig Dispense Refill   abemaciclib  (VERZENIO ) 50 MG tablet Take 1 tablet (50 mg total) by mouth 2 (two) times daily. 56 tablet 2   ACCU-CHEK GUIDE test strip check sugar 3x daily In Vitro three times daily for 90 days     Accu-Chek Softclix Lancets lancets USE 1 TO CHECK GLUCOSE THREE TIMES DAILY     aspirin 81 MG tablet Take 81 mg by mouth daily.      benazepril -hydrochlorthiazide (LOTENSIN  HCT) 20-25 MG tablet Take 1 tablet by mouth daily.     Blood Glucose Monitoring Suppl (ACCU-CHEK GUIDE) w/Device KIT check glucose daily and as needed for 365 days     Cyanocobalamin  (B-12) 500 MCG SUBL Place 1 tablet under the tongue daily.     dicyclomine  (BENTYL ) 10 MG capsule Take 1 capsule (10 mg total) by mouth 2 (two) times daily as needed for spasms. 30 capsule 0   docusate sodium (COLACE) 100 MG capsule Take 100 mg by mouth daily.     empagliflozin (JARDIANCE) 10 MG TABS tablet Take 10 mg by mouth daily.     EUTHYROX  175 MCG tablet Take 175 mcg by mouth daily before breakfast.     letrozole  (FEMARA ) 2.5 MG tablet Take 1 tablet by mouth once daily 90 tablet 3   metFORMIN (GLUCOPHAGE) 500 MG tablet 500 mg 2 (two) times daily with a meal.     metoprolol  succinate (TOPROL  XL) 50 MG 24 hr tablet Take 1 tablet (50 mg total) by mouth daily. Take with or immediately following a meal. 90 tablet 2   nystatin cream (MYCOSTATIN)      ondansetron  (ZOFRAN ) 8 MG tablet Take 1 tablet (8 mg total) by mouth every 8 (eight) hours as needed for nausea or vomiting. 30 tablet 2   rosuvastatin  (CRESTOR ) 40 MG tablet Take 1 tablet (40 mg total) by mouth daily. 90 tablet 2   terbinafine (LAMISIL) 250 MG tablet       triamcinolone (KENALOG) 0.025 % cream  VITAMIN D3 1.25 MG (50000 UT) capsule Take 50,000 Units by mouth once a week.     No current facility-administered medications for this visit.    Drug-Drug Interactions (DDIs) DDIs were evaluated? Yes Significant DDIs? No , terbinafine is topical and PRN The patient was instructed to speak with their health care provider and/or the oral chemotherapy pharmacist before starting any new drug, including prescription or over the counter, natural / herbal products, or vitamins.  Supportive Care Diarrhea: we reviewed that diarrhea is common with abemaciclib  and confirmed that she does have loperamide (Imodium) at home.  We reviewed how to take this medication PRN. Neutropenia: we discussed the importance of having a thermometer and what the Centers for Disease Control and Prevention (CDC) considers a fever which is 100.46F (38C) or higher.  Gave patient 24/7 triage line to call if any fevers or symptoms. ILD/Pneumonitis: we reviewed potential symptoms including cough, shortness, and fatigue.  VTE: reviewed signs of DVT such as leg swelling, redness, pain, or tenderness and signs of PE such as shortness of breath, rapid or irregular heartbeat, cough, chest pain, or lightheadedness. Reviewed to take the medication every 12 hours (with food sometimes can be easier on the stomach) and to take it at the same time every day. Hepatotoxicity:WNL Drug interactions with grapefruit products  Dosing Assessment Hepatic adjustments needed? No  Renal adjustments needed? No  Toxicity adjustments needed? No  The current dosing regimen is appropriate to continue at this time.  Follow-Up Plan Continue abemaciclib  50 mg by mouth every 12 hours Continue letrozole  2.5 mg by mouth daily Monitor serum creatinine. PCP lowered metformin dose to 500 mg BID and started patient on Jardiance which seems to have helped Labs, Dr. Loretha visit scheduled for 11/07/23 Sent information  to Cassie in insurance changes Bijou H Milos can follow up with clinical pharmacy as deemed necessary by Dr. Amber Iruku going forward   Grayce VEAR Galla participated in the discussion, expressed understanding, and voiced agreement with the above plan. All questions were answered to her satisfaction. The patient was advised to contact the clinic at [phone] with any questions or concerns prior to her return visit.   I spent 30 minutes assessing and educating the patient.  Vonzell Lindblad A. Lucila, PharmD, BCOP, CPP  Norleen DELENA Lucila, RPH-CPP, 09/20/2023  9:55 AM   **Disclaimer: This note was dictated with voice recognition software. Similar sounding words can inadvertently be transcribed and this note may contain transcription errors which may not have been corrected upon publication of note.**

## 2023-09-20 ENCOUNTER — Other Ambulatory Visit (HOSPITAL_COMMUNITY): Payer: Self-pay

## 2023-09-20 ENCOUNTER — Telehealth: Payer: Self-pay

## 2023-09-20 ENCOUNTER — Inpatient Hospital Stay: Attending: Hematology and Oncology

## 2023-09-20 ENCOUNTER — Other Ambulatory Visit: Payer: Self-pay | Admitting: Pharmacist

## 2023-09-20 ENCOUNTER — Inpatient Hospital Stay: Admitting: Pharmacist

## 2023-09-20 VITALS — BP 136/77 | HR 67 | Temp 97.3°F | Resp 17 | Wt 204.3 lb

## 2023-09-20 DIAGNOSIS — C50412 Malignant neoplasm of upper-outer quadrant of left female breast: Secondary | ICD-10-CM | POA: Diagnosis not present

## 2023-09-20 DIAGNOSIS — Z923 Personal history of irradiation: Secondary | ICD-10-CM | POA: Diagnosis not present

## 2023-09-20 DIAGNOSIS — Z170 Estrogen receptor positive status [ER+]: Secondary | ICD-10-CM | POA: Insufficient documentation

## 2023-09-20 DIAGNOSIS — Z79811 Long term (current) use of aromatase inhibitors: Secondary | ICD-10-CM | POA: Diagnosis not present

## 2023-09-20 LAB — CMP (CANCER CENTER ONLY)
ALT: 21 U/L (ref 0–44)
AST: 18 U/L (ref 15–41)
Albumin: 4.1 g/dL (ref 3.5–5.0)
Alkaline Phosphatase: 68 U/L (ref 38–126)
Anion gap: 6 (ref 5–15)
BUN: 23 mg/dL (ref 8–23)
CO2: 29 mmol/L (ref 22–32)
Calcium: 9.6 mg/dL (ref 8.9–10.3)
Chloride: 106 mmol/L (ref 98–111)
Creatinine: 1.16 mg/dL — ABNORMAL HIGH (ref 0.44–1.00)
GFR, Estimated: 52 mL/min — ABNORMAL LOW (ref >60)
Glucose, Bld: 138 mg/dL — ABNORMAL HIGH (ref 70–99)
Potassium: 3.9 mmol/L (ref 3.5–5.1)
Sodium: 141 mmol/L (ref 135–145)
Total Bilirubin: 0.7 mg/dL (ref 0.0–1.2)
Total Protein: 6.5 g/dL (ref 6.5–8.1)

## 2023-09-20 LAB — CBC WITH DIFFERENTIAL (CANCER CENTER ONLY)
Abs Immature Granulocytes: 0.01 K/uL (ref 0.00–0.07)
Basophils Absolute: 0.1 K/uL (ref 0.0–0.1)
Basophils Relative: 1 %
Eosinophils Absolute: 0.2 K/uL (ref 0.0–0.5)
Eosinophils Relative: 4 %
HCT: 36.8 % (ref 36.0–46.0)
Hemoglobin: 12.7 g/dL (ref 12.0–15.0)
Immature Granulocytes: 0 %
Lymphocytes Relative: 31 %
Lymphs Abs: 1.4 K/uL (ref 0.7–4.0)
MCH: 32.4 pg (ref 26.0–34.0)
MCHC: 34.5 g/dL (ref 30.0–36.0)
MCV: 93.9 fL (ref 80.0–100.0)
Monocytes Absolute: 0.3 K/uL (ref 0.1–1.0)
Monocytes Relative: 7 %
Neutro Abs: 2.5 K/uL (ref 1.7–7.7)
Neutrophils Relative %: 57 %
Platelet Count: 162 K/uL (ref 150–400)
RBC: 3.92 MIL/uL (ref 3.87–5.11)
RDW: 12.6 % (ref 11.5–15.5)
WBC Count: 4.5 K/uL (ref 4.0–10.5)
nRBC: 0 % (ref 0.0–0.2)

## 2023-09-20 MED ORDER — ABEMACICLIB 50 MG PO TABS: 50.0000 mg | ORAL_TABLET | Freq: Two times a day (BID) | ORAL | 3 refills | Status: DC

## 2023-09-20 NOTE — Telephone Encounter (Signed)
 Oral Oncology Patient Advocate Encounter  After completing a benefits investigation, prior authorization for Verzenio  is not required at this time through [facility]  Patient's copay is $2000  Patient does not qualify for PAP She is aware of copay and confirmed she would like to proceed with filling the prescription She would like 90 day supplies sent to [facility]    Charlott Hamilton,  CPhT-Adv  she/her/hers [place]  [facility] Pharmacy Technician Patient Advocate Specialist III [facility] Phone: [phone]  Fax: [phone] [email or web address]

## 2023-09-25 ENCOUNTER — Ambulatory Visit: Attending: Surgery

## 2023-09-25 VITALS — Wt 205.0 lb

## 2023-09-25 DIAGNOSIS — Z483 Aftercare following surgery for neoplasm: Secondary | ICD-10-CM | POA: Insufficient documentation

## 2023-09-25 NOTE — Therapy (Signed)
 OUTPATIENT PHYSICAL THERAPY SOZO SCREENING NOTE   Patient Name: Lauren [name] MRN: [id number] DOB:[birth year], 68 y.o., female Today's Date: 09/25/2023  PCP: Dyane Anthony RAMAN, FNP REFERRING PROVIDER: Vernetta Berg, MD   PT End of Session - 09/25/23 [phone]     Visit Number 1   # unchanged due to screen only   PT Start Time 0950    PT Stop Time 0954    PT Time Calculation (min) 4 min    Activity Tolerance Patient tolerated treatment well    Behavior During Therapy [facility] for tasks assessed/performed          Past Medical History:  Diagnosis Date   Breast cancer (HCC)    Diabetes mellitus without complication (HCC)    Hyperlipidemia    Hypertension    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   not current problem, no hematologist   Past Surgical History:  Procedure Laterality Date   ABDOMINAL HYSTERECTOMY  11-18-1998   partial   BREAST BIOPSY Left 03/15/2022   US  LT BREAST BX W LOC DEV 1ST LESION IMG BX SPEC US  GUIDE 03/15/2022 [facility]   BREAST BIOPSY Left 03/15/2022   US  LT BREAST BX W LOC DEV EA ADD LESION IMG BX SPEC US  GUIDE 03/15/2022 [facility]   CHOLECYSTECTOMY  08/02/2011   Procedure: LAPAROSCOPIC CHOLECYSTECTOMY WITH INTRAOPERATIVE CHOLANGIOGRAM;  Surgeon: Donnice KATHEE Lunger, MD;  Location: [facility];  Service: General;  Laterality: N/A;   LAPAROSCOPIC GASTRIC BANDING  05/19/2008   LUMBAR DISC SURGERY  03/21/1996   MASTECTOMY W/ SENTINEL NODE BIOPSY Left 04/14/2022   Procedure: LEFT MASTECTOMY WITH SENTINEL NODE BIOPSY;  Surgeon: Vernetta Berg, MD;  Location: [facility];  Service: General;  Laterality: Left;   THYROIDECTOMY N/A 09/08/2015   Procedure: TOTAL THYROIDECTOMY;  Surgeon: Krystal Spinner, MD;  Location: [facility];  Service: General;  Laterality: N/A;   TONSILLECTOMY     Patient Active Problem List   Diagnosis Date Noted   Vitamin B deficiency 11/08/2022   Type 2 diabetes mellitus without complication, without  long-term current use of insulin  (HCC) 11/08/2022   Pre-diabetes 11/08/2022   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   Memory impairment 11/08/2022   Hypomagnesemia 11/08/2022   Hypertension 11/08/2022   Hyperlipidemia 11/08/2022   History of papillary adenocarcinoma of thyroid  11/08/2022   History of ITP 11/08/2022   History of COVID-19 11/08/2022   History of adenomatous polyp of colon 11/08/2022   GERD (gastroesophageal reflux disease) 11/08/2022   Cholelithiasis 11/08/2022   BMI 40.0-44.9, adult (HCC) 11/08/2022   Aortic atherosclerosis (HCC) 11/08/2022   Genetic testing 03/31/2022   Malignant neoplasm of upper-outer quadrant of left breast in female, estrogen receptor positive (HCC) 03/21/2022   Disorder of tendon 02/17/2017   Toxic multinodular goiter 09/08/2015   Hyperthyroidism 09/04/2015   Multinodular goiter 04/29/2011   Chronic cholecystitis 04/29/2011   Lapband APS April 2010 03/18/2011   GIST (gastrointestinal stromal tumor), 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 It measures 9.2 cm of calcifications and is located in the upper outer quadrant. It is ER/PR positive and HER2 negative with a Ki67 of 30%.   PRECAUTIONS: left UE Lymphedema risk, None  SUBJECTIVE: Pt returns for her 3 month L-Dex screen.   PAIN:  Are you  having pain? No  SOZO SCREENING: Patient was assessed today using the SOZO machine to determine the lymphedema index score. This was compared to her baseline score. It was determined that she is within the recommended range when compared to her baseline and no further action is needed at this time. She will continue SOZO screenings. These are done every 3 months for 2 years post  operatively followed by every 6 months for 2 years, and then annually.   L-DEX FLOWSHEETS - 09/25/23 0900       L-DEX LYMPHEDEMA SCREENING   Measurement Type Unilateral    L-DEX MEASUREMENT EXTREMITY Upper Extremity    POSITION  Standing    DOMINANT SIDE Left    At Risk Side Left    BASELINE SCORE (UNILATERAL) -3.2    L-DEX SCORE (UNILATERAL) -2    VALUE CHANGE (UNILAT) 1.2          P: Cont 3 month L-Dex screens.  Aden Berwyn Caldron, PTA 09/25/2023, 9:54 AM

## 2023-10-12 ENCOUNTER — Telehealth: Payer: Self-pay

## 2023-10-12 NOTE — Telephone Encounter (Signed)
 Oral Oncology Patient Advocate Encounter  Prior Authorization Renewal for Verzenio  has been approved through BC/BS.  PA# [id number] Effective dates: 10/12/23 through 02/14/24      Charlott Hamilton,  CPhT-Adv  she/her/hers [facility]  [facility] Pharmacy Technician Patient Advocate Specialist III [facility] Phone: [phone]  Fax: [phone] [email or web address]

## 2023-10-12 NOTE — Telephone Encounter (Signed)
 Oral Oncology Patient Advocate Encounter   Received notification that prior authorization for Verzenio  is due for renewal.   PA submitted on 10/12/23 Key [id number] Status is pending      Charlott Hamilton,  CPhT-Adv  she/her/hers [facility]  [facility] Pharmacy Technician Patient Advocate Specialist III [facility] Phone: [phone]  Fax: [phone] [email or web address]

## 2023-10-24 DIAGNOSIS — C73 Malignant neoplasm of thyroid gland: Secondary | ICD-10-CM | POA: Diagnosis not present

## 2023-10-24 DIAGNOSIS — E890 Postprocedural hypothyroidism: Secondary | ICD-10-CM | POA: Diagnosis not present

## 2023-10-24 DIAGNOSIS — R9389 Abnormal findings on diagnostic imaging of other specified body structures: Secondary | ICD-10-CM | POA: Diagnosis not present

## 2023-11-07 ENCOUNTER — Inpatient Hospital Stay: Attending: Hematology and Oncology | Admitting: Hematology and Oncology

## 2023-11-07 ENCOUNTER — Inpatient Hospital Stay

## 2023-11-07 VITALS — BP 137/70 | HR 77 | Temp 97.9°F | Resp 17 | Wt 203.8 lb

## 2023-11-07 DIAGNOSIS — Z17411 Hormone receptor positive with human epidermal growth factor receptor 2 negative status: Secondary | ICD-10-CM | POA: Diagnosis not present

## 2023-11-07 DIAGNOSIS — C50412 Malignant neoplasm of upper-outer quadrant of left female breast: Secondary | ICD-10-CM

## 2023-11-07 DIAGNOSIS — Z79811 Long term (current) use of aromatase inhibitors: Secondary | ICD-10-CM | POA: Diagnosis not present

## 2023-11-07 DIAGNOSIS — Z170 Estrogen receptor positive status [ER+]: Secondary | ICD-10-CM | POA: Diagnosis not present

## 2023-11-07 LAB — CBC WITH DIFFERENTIAL (CANCER CENTER ONLY)
Abs Immature Granulocytes: 0.02 K/uL (ref 0.00–0.07)
Basophils Absolute: 0.1 K/uL (ref 0.0–0.1)
Basophils Relative: 2 %
Eosinophils Absolute: 0.2 K/uL (ref 0.0–0.5)
Eosinophils Relative: 4 %
HCT: 36.1 % (ref 36.0–46.0)
Hemoglobin: 12.5 g/dL (ref 12.0–15.0)
Immature Granulocytes: 1 %
Lymphocytes Relative: 30 %
Lymphs Abs: 1.3 K/uL (ref 0.7–4.0)
MCH: 32.6 pg (ref 26.0–34.0)
MCHC: 34.6 g/dL (ref 30.0–36.0)
MCV: 94 fL (ref 80.0–100.0)
Monocytes Absolute: 0.4 K/uL (ref 0.1–1.0)
Monocytes Relative: 10 %
Neutro Abs: 2.3 K/uL (ref 1.7–7.7)
Neutrophils Relative %: 53 %
Platelet Count: 163 K/uL (ref 150–400)
RBC: 3.84 MIL/uL — ABNORMAL LOW (ref 3.87–5.11)
RDW: 12.7 % (ref 11.5–15.5)
WBC Count: 4.3 K/uL (ref 4.0–10.5)
nRBC: 0 % (ref 0.0–0.2)

## 2023-11-07 LAB — CMP (CANCER CENTER ONLY)
ALT: 18 U/L (ref 0–44)
AST: 17 U/L (ref 15–41)
Albumin: 4.1 g/dL (ref 3.5–5.0)
Alkaline Phosphatase: 64 U/L (ref 38–126)
Anion gap: 6 (ref 5–15)
BUN: 19 mg/dL (ref 8–23)
CO2: 29 mmol/L (ref 22–32)
Calcium: 9.9 mg/dL (ref 8.9–10.3)
Chloride: 107 mmol/L (ref 98–111)
Creatinine: 1.13 mg/dL — ABNORMAL HIGH (ref 0.44–1.00)
GFR, Estimated: 53 mL/min — ABNORMAL LOW (ref >60)
Glucose, Bld: 143 mg/dL — ABNORMAL HIGH (ref 70–99)
Potassium: 3.6 mmol/L (ref 3.5–5.1)
Sodium: 142 mmol/L (ref 135–145)
Total Bilirubin: 0.6 mg/dL (ref 0.0–1.2)
Total Protein: 6.6 g/dL (ref 6.5–8.1)

## 2023-11-07 MED ORDER — LETROZOLE 2.5 MG PO TABS: 2.5000 mg | ORAL_TABLET | Freq: Every day | ORAL | 3 refills | Status: AC

## 2023-11-07 NOTE — Assessment & Plan Note (Addendum)
 This is a very pleasant 68 yr old female patient with PMH significant for HTN, obesity, hyperlipidemia referred to breast [facility] for recommendations.  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   Oncotype of 10, no role for chemo She completed radiation on 07/21/2022 She is now on verzenio  and letrozole .  Assessment and Plan Assessment & Plan Right breast cancer Currently on Verzenio  and letrozole , tolerating well with occasional hot flashes.  - Continue Verzenio  and letrozole . - Order mammogram for January. - Alternate follow-up visits between us  and CPP.  Post-thyroidectomy hypothyroidism Managed with levothyroxine . Antibody levels increasing,. Endocrinologist may consider ultrasound or further treatment based on nodule size. - Continue levothyroxine . - Follow-up with endocrinologist in April.  Type 2 diabetes mellitus Diabetes under better control with Jardiance. - Continue Jardiance. - Follow-up with primary care provider for diabetes management.  Hip bursitis Chronic hip pain likely due to bursitis, causing difficulty in sleeping and walking. Previous steroid injections effective. - Contact orthopedic doctor for evaluation and possible steroid injection.

## 2023-11-07 NOTE — Progress Notes (Signed)
 [facility] CONSULT NOTE  Patient Care Team: Dyane Anthony RAMAN, FNP as PCP - General (Family Medicine) Loni Soyla LABOR, MD as PCP - Cardiology (Cardiology) Vernetta Berg, MD as Consulting Physician (General Surgery) Loretha Ash, MD as Consulting Physician (Hematology and Oncology) Dewey Rush, MD as Consulting Physician (Radiation Oncology)  CHIEF COMPLAINTS/PURPOSE OF CONSULTATION:  Newly diagnosed breast cancer  HISTORY OF PRESENTING ILLNESS:  Lauren [name] 68 y.o. female is here because of recent diagnosis of left breast cancer.  I reviewed her records extensively and collaborated the history with the patient.  SUMMARY OF ONCOLOGIC HISTORY: Oncology History  Malignant neoplasm of upper-outer quadrant of left breast in female, estrogen receptor positive (HCC)  03/04/2022 Mammogram   Screening mammogram showed calcs and possible distortion in the left breast. Diag mammogram showed suspicious calcs and non mass finding in the 2 0 clock location of the left breast warranting further tissue diagnosis. No left axillary adenopathy   03/10/2022 Breast US    Suspicious calcifications and non mass enhancement measuring approximately 4.9 cm in largest dimension.   03/23/2022 Cancer Staging   Staging form: Breast, AJCC 8th Edition - Pathologic: Stage IA (pT1b, pN1a, cM0, G2, ER+, PR+, HER2-) - Signed by Loretha Ash, MD on 05/06/2022 Histologic grading system: 3 grade system   03/25/2022 Pathology Results   Pathology showed grade 2 IDC, ER positive, PR positive, her 2 neg, Ki 67 30%   03/31/2022 Genetic Testing   Negative. The Multi-Cancer + RNA Panel offered by [facility] includes sequencing and/or deletion/duplication analysis of the following 70 genes:  AIP*, ALK, APC*, ATM*, AXIN2*, BAP1*, BARD1*, BLM*, BMPR1A*, BRCA1*, BRCA2*, BRIP1*, CDC73*, CDH1*, CDK4, CDKN1B*, CDKN2A, CHEK2*, CTNNA1*, DICER1*, EPCAM (del/dup only), EGFR, FH*, FLCN*, GREM1 (promoter dup only),  HOXB13, KIT, LZTR1, MAX*, MBD4, MEN1*, MET, MITF, MLH1*, MSH2*, MSH3*, MSH6*, MUTYH*, NF1*, NF2*, NTHL1*, PALB2*, PDGFRA, PMS2*, POLD1*, POLE*, POT1*, PRKAR1A*, PTCH1*, PTEN*, RAD51C*, RAD51D*, RB1*, RET, SDHA* (sequencing only), SDHAF2*, SDHB*, SDHC*, SDHD*, SMAD4*, SMARCA4*, SMARCB1*, SMARCE1*, STK11*, SUFU*, TMEM127*, TP53*, TSC1*, TSC2*, VHL*. RNA analysis is performed for * genes.   04/14/2022 Surgery   Left breast mastectomy: IDC, grade 2, 0.8cm, 3/17 LN positive for macrometastases, margins negative.    05/09/2022 Oncotype testing   10/12%   06/06/2022 - 07/21/2022 Radiation Therapy   Plan Name: CW_L_BO_BH Site: Chest Wall, Left Technique: 3D Mode: Photon Dose Per Fraction: 1.8 Gy Prescribed Dose (Delivered / Prescribed): 25.2 Gy / 25.2 Gy Prescribed Fxs (Delivered / Prescribed): 14 / 14   Plan Name: CW_PAB_SCV_BH Site: Chest Wall, Left Technique: 3D Mode: Photon Dose Per Fraction: 1.8 Gy Prescribed Dose (Delivered / Prescribed): 50.4 Gy / 50.4 Gy Prescribed Fxs (Delivered / Prescribed): 28 / 28   Plan Name: CW_L_Bst_BO Site: Chest Wall, Left Technique: Electron Mode: Electron Dose Per Fraction: 2 Gy Prescribed Dose (Delivered / Prescribed): 10 Gy / 10 Gy Prescribed Fxs (Delivered / Prescribed): 5 / 5   Plan Name: CW_L_BH Site: Chest Wall, Left Technique: 3D Mode: Photon Dose Per Fraction: 1.8 Gy Prescribed Dose (Delivered / Prescribed): 25.2 Gy / 25.2 Gy Prescribed Fxs (Delivered / Prescribed): 14 / 14   10/04/2022 -  Anti-estrogen oral therapy   Letrozole     She is now status post left mastectomy, extensive DCIS intermediate nuclear grade, invasive moderately differentiated adenocarcinoma grade 2, invasive tumor measures 8 mm in greatest dimension.  Margins free.  Left axillary lymph nodes regional resection which showed 3 of 17 lymph nodes with metastatic carcinoma showing marked fatty replacement.  Negative for extracapsular extension.  Prior prognostic showed ER  +95% strong staining PR 90% moderate to strong staining HER2 negative 1+ Ki-67 of 30%. She completed adjuvant radiation on 07/21/2022. She is now on adjuvant verzenio  and letrozole .  Discussed the use of AI scribe software for clinical note transcription with the patient, who gave verbal consent to proceed.  History of Present Illness    History of Present Illness Lauren [name] is a 68 year old female with breast cancer who presents for a follow-up visit.  Lauren [name] is a 68 year old female with breast cancer on verzenio  and letrozole , thyroid  cancer and diabetes who presents for follow-up regarding her current medications and symptoms.  She experiences occasional hot flashes, which are not daily and occur randomly. She manages her hydration by monitoring her urine color to ensure it is clear or light yellow  Regarding her thyroid  condition, her antibody levels have increased to the twenties, and her levothyroxine  dose was increased about a year ago to suppress growth. An ultrasound was performed at the site of her previous thyroid  surgery, but a biopsy was not possible due to the size of the nodule. She undergoes blood work every three to six months.  She experiences significant hip pain, which has worsened over time, affecting her sleep and mobility. She has a history of bursitis and has received steroid injections in the past for this. Steroid injections in the past provided relief, but the pain has worsened, affecting her sleep and mobility.  She has a history of flat feet, affecting her ankles, knees, and hips. Custom orthotics were ineffective, and surgical intervention was deemed too invasive given her age and the extent of the procedure required.  She has a history of lap band surgery, and an ultrasound was performed to investigate abdominal wall bulging, attributed to the injection port for the lap band. A CT scan in 2017 showed a gastric band in place and a small hiatal  hernia.   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   not current problem, no hematologist    SURGICAL HISTORY: Past Surgical History:  Procedure Laterality Date   ABDOMINAL HYSTERECTOMY  11-18-1998   partial   BREAST BIOPSY Left 03/15/2022   US  LT BREAST BX W LOC DEV 1ST LESION IMG BX SPEC US  GUIDE 03/15/2022 [facility]   BREAST BIOPSY Left 03/15/2022   US  LT BREAST BX W LOC DEV EA ADD LESION IMG BX SPEC US  GUIDE 03/15/2022 [facility]   CHOLECYSTECTOMY  08/02/2011   Procedure: LAPAROSCOPIC CHOLECYSTECTOMY WITH INTRAOPERATIVE CHOLANGIOGRAM;  Surgeon: Donnice KATHEE Lunger, MD;  Location: [facility];  Service: General;  Laterality: N/A;   LAPAROSCOPIC GASTRIC BANDING  05/19/2008   LUMBAR DISC SURGERY  03/21/1996   MASTECTOMY W/ SENTINEL NODE BIOPSY Left 04/14/2022   Procedure: LEFT MASTECTOMY WITH SENTINEL NODE BIOPSY;  Surgeon: Vernetta Berg, MD;  Location: [facility];  Service: General;  Laterality: Left;   THYROIDECTOMY N/A 09/08/2015   Procedure: TOTAL THYROIDECTOMY;  Surgeon: Krystal Spinner, MD;  Location: [facility];  Service: General;  Laterality: N/A;   TONSILLECTOMY      SOCIAL HISTORY: Social History   Socioeconomic History   Marital status: Married    Spouse name:  Not on file   Number of children: Not on file   Years of education: Not on file   Highest education level: Not on file  Occupational History   Not on file  Tobacco Use   Smoking status: Never   Smokeless tobacco: Never  Substance and Sexual Activity   Alcohol use: No   Drug use: No   Sexual activity: Yes  Other Topics Concern   Not on file  Social History Narrative   Not on file   Social Drivers of Health   Financial Resource Strain: Low Risk   (03/23/2022)   Overall Financial Resource Strain (CARDIA)    Difficulty of Paying Living Expenses: Not hard at all  Food Insecurity: No Food Insecurity (03/23/2022)   Hunger Vital Sign    Worried About Running Out of Food in the Last Year: Never true    Ran Out of Food in the Last Year: Never true  Transportation Needs: No Transportation Needs (03/23/2022)   PRAPARE - Administrator, Civil Service (Medical): No    Lack of Transportation (Non-Medical): No  Physical Activity: Not on file  Stress: Not on file  Social Connections: Not on file  Intimate Partner Violence: Not on file    FAMILY HISTORY: Family History  Problem Relation Age of Onset   Colon polyps Mother    Heart disease Mother    Diabetes Father    Heart disease Father    Breast cancer Maternal Aunt 29 - 69   Ovarian cancer Paternal Aunt    Bladder Cancer Paternal Uncle        paternal half-uncle   Breast cancer Cousin 44 - 77    ALLERGIES:  has no known allergies.  MEDICATIONS:  Current Outpatient Medications  Medication Sig Dispense Refill   abemaciclib  (VERZENIO ) 50 MG tablet Take 1 tablet (50 mg total) by mouth 2 (two) times daily. 56 tablet 3   ACCU-CHEK GUIDE test strip check sugar 3x daily In Vitro three times daily for 90 days     Accu-Chek Softclix Lancets lancets USE 1 TO CHECK GLUCOSE THREE TIMES DAILY     aspirin 81 MG tablet Take 81 mg by mouth daily.      benazepril -hydrochlorthiazide (LOTENSIN  HCT) 20-25 MG tablet Take 1 tablet by mouth daily.     Blood Glucose Monitoring Suppl (ACCU-CHEK GUIDE) w/Device KIT check glucose daily and as needed for 365 days     Cyanocobalamin  (B-12) 500 MCG SUBL Place 1 tablet under the tongue daily.     dicyclomine  (BENTYL ) 10 MG capsule Take 1 capsule (10 mg total) by mouth 2 (two) times daily as needed for spasms. 30 capsule 0   docusate sodium (COLACE) 100 MG capsule Take 100 mg by mouth daily.     empagliflozin (JARDIANCE) 10 MG TABS tablet Take 10 mg by  mouth daily.     EUTHYROX  175 MCG tablet Take 175 mcg by mouth daily before breakfast.     letrozole  (FEMARA ) 2.5 MG tablet Take 1 tablet (2.5 mg total) by mouth daily. 90 tablet 3   metFORMIN (GLUCOPHAGE) 500 MG tablet 500 mg 2 (two) times daily with a meal.     metoprolol  succinate (TOPROL  XL) 50 MG 24 hr tablet Take 1 tablet (50 mg total) by mouth daily. Take with or immediately following a meal. 90 tablet 2   nystatin cream (MYCOSTATIN)      ondansetron  (ZOFRAN ) 8 MG tablet Take 1 tablet (8 mg total) by mouth every  8 (eight) hours as needed for nausea or vomiting. 30 tablet 2   rosuvastatin  (CRESTOR ) 40 MG tablet Take 1 tablet (40 mg total) by mouth daily. 90 tablet 2   terbinafine (LAMISIL) 250 MG tablet      triamcinolone (KENALOG) 0.025 % cream      VITAMIN D3 1.25 MG (50000 UT) capsule Take 50,000 Units by mouth once a week.     No current facility-administered medications for this visit.   REVIEW OF SYSTEMS:   Constitutional: Denies fevers, chills or abnormal night sweats Eyes: Denies blurriness of vision, double vision or watery eyes Ears, nose, mouth, throat, and face: Denies mucositis or sore throat Respiratory: Denies cough, dyspnea or wheezes Cardiovascular: Denies palpitation, chest discomfort or lower extremity swelling Gastrointestinal:  Denies nausea, heartburn or change in bowel habits Skin: Denies abnormal skin rashes Lymphatics: Denies new lymphadenopathy or easy bruising Neurological:Denies numbness, tingling or new weaknesses Behavioral/Psych: Mood is stable, no new changes  Breast: Denies any palpable lumps or discharge All other systems were reviewed with the patient and are negative.  PHYSICAL EXAMINATION: ECOG PERFORMANCE STATUS: 0 - Asymptomatic  Vitals:   11/07/23 0901  BP: 137/70  Pulse: 77  Resp: 17  Temp: 97.9 F (36.6 C)  SpO2: 100%     Filed Weights   11/07/23 0901  Weight: 203 lb 12.8 oz (92.4 kg)     Physical Exam Constitutional:       Appearance: Normal appearance.  Cardiovascular:     Rate and Rhythm: Normal rate and regular rhythm.     Pulses: Normal pulses.     Heart sounds: Normal heart sounds.  Pulmonary:     Effort: Pulmonary effort is normal.     Breath sounds: Normal breath sounds.  Abdominal:     General: Abdomen is flat. Bowel sounds are normal. There is no distension.     Palpations: There is no mass.     Comments: Midline bulging noted.  Musculoskeletal:     Cervical back: Normal range of motion. No rigidity.  Lymphadenopathy:     Cervical: No cervical adenopathy.  Skin:    General: Skin is warm and dry.  Neurological:     General: No focal deficit present.     Mental Status: She is alert.  Psychiatric:        Mood and Affect: Mood normal.      LABORATORY DATA:  I have reviewed the data as listed Lab Results  Component Value Date   WBC 4.3 11/07/2023   HGB 12.5 11/07/2023   HCT 36.1 11/07/2023   MCV 94.0 11/07/2023   PLT 163 11/07/2023   Lab Results  Component Value Date   NA 142 11/07/2023   K 3.6 11/07/2023   CL 107 11/07/2023   CO2 29 11/07/2023    RADIOGRAPHIC STUDIES: I have personally reviewed the radiological reports and agreed with the findings in the report.  ASSESSMENT AND PLAN:   Malignant neoplasm of upper-outer quadrant of left breast in female, estrogen receptor positive (HCC) This is a very pleasant 68 yr old female patient with PMH significant for HTN, obesity, hyperlipidemia referred to breast [facility] for recommendations.  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   Oncotype of 10, no role for chemo She completed radiation on 07/21/2022 She is now on verzenio  and letrozole .  Assessment and Plan Assessment &  Plan Right breast cancer Currently on Verzenio  and letrozole , tolerating well with occasional hot  flashes.  - Continue Verzenio  and letrozole . - Order mammogram for January. - Alternate follow-up visits between us  and CPP.  Post-thyroidectomy hypothyroidism Managed with levothyroxine . Antibody levels increasing,. Endocrinologist may consider ultrasound or further treatment based on nodule size. - Continue levothyroxine . - Follow-up with endocrinologist in April.  Type 2 diabetes mellitus Diabetes under better control with Jardiance. - Continue Jardiance. - Follow-up with primary care provider for diabetes management.  Hip bursitis Chronic hip pain likely due to bursitis, causing difficulty in sleeping and walking. Previous steroid injections effective. - Contact orthopedic doctor for evaluation and possible steroid injection.       Total time spent: 20 minutes including history, physical exam, review of records, counseling and coordination of care, All questions were answered. The patient knows to call the clinic with any problems, questions or concerns.    Amber Stalls, MD 11/07/23

## 2023-11-08 DIAGNOSIS — E559 Vitamin D deficiency, unspecified: Secondary | ICD-10-CM | POA: Diagnosis not present

## 2023-11-08 DIAGNOSIS — N1831 Chronic kidney disease, stage 3a: Secondary | ICD-10-CM | POA: Diagnosis not present

## 2023-11-08 DIAGNOSIS — Z170 Estrogen receptor positive status [ER+]: Secondary | ICD-10-CM | POA: Diagnosis not present

## 2023-11-08 DIAGNOSIS — E538 Deficiency of other specified B group vitamins: Secondary | ICD-10-CM | POA: Diagnosis not present

## 2023-11-08 DIAGNOSIS — M62838 Other muscle spasm: Secondary | ICD-10-CM | POA: Diagnosis not present

## 2023-11-08 DIAGNOSIS — R140 Abdominal distension (gaseous): Secondary | ICD-10-CM | POA: Diagnosis not present

## 2023-11-08 DIAGNOSIS — E785 Hyperlipidemia, unspecified: Secondary | ICD-10-CM | POA: Diagnosis not present

## 2023-11-08 DIAGNOSIS — Z23 Encounter for immunization: Secondary | ICD-10-CM | POA: Diagnosis not present

## 2023-11-08 DIAGNOSIS — E119 Type 2 diabetes mellitus without complications: Secondary | ICD-10-CM | POA: Diagnosis not present

## 2023-11-08 DIAGNOSIS — I10 Essential (primary) hypertension: Secondary | ICD-10-CM | POA: Diagnosis not present

## 2023-11-08 DIAGNOSIS — Z0000 Encounter for general adult medical examination without abnormal findings: Secondary | ICD-10-CM | POA: Diagnosis not present

## 2023-11-08 DIAGNOSIS — I700 Atherosclerosis of aorta: Secondary | ICD-10-CM | POA: Diagnosis not present

## 2023-11-08 DIAGNOSIS — C50412 Malignant neoplasm of upper-outer quadrant of left female breast: Secondary | ICD-10-CM | POA: Diagnosis not present

## 2023-12-25 ENCOUNTER — Ambulatory Visit: Attending: Surgery

## 2023-12-25 VITALS — Wt 201.2 lb

## 2023-12-25 DIAGNOSIS — Z483 Aftercare following surgery for neoplasm: Secondary | ICD-10-CM | POA: Insufficient documentation

## 2023-12-25 NOTE — Therapy (Signed)
 OUTPATIENT PHYSICAL THERAPY SOZO SCREENING NOTE   Patient Name: Lauren [name] MRN: [id number] DOB:1955-03-20, 68 y.o., female Today's Date: 12/25/2023  PCP: Dyane Anthony RAMAN, FNP REFERRING PROVIDER: Vernetta Berg, MD   PT End of Session - 12/25/23 [phone]     Visit Number 1   # unchanged due to screen only   PT Start Time 0929    PT Stop Time 0934    PT Time Calculation (min) 5 min    Activity Tolerance Patient tolerated treatment well    Behavior During Therapy [facility] for tasks assessed/performed          Past Medical History:  Diagnosis Date   Breast cancer (HCC)    Diabetes mellitus without complication (HCC)    Hyperlipidemia    Hypertension    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   not current problem, no hematologist   Past Surgical History:  Procedure Laterality Date   ABDOMINAL HYSTERECTOMY  11-18-1998   partial   BREAST BIOPSY Left 03/15/2022   US  LT BREAST BX W LOC DEV 1ST LESION IMG BX SPEC US  GUIDE 03/15/2022 [facility]   BREAST BIOPSY Left 03/15/2022   US  LT BREAST BX W LOC DEV EA ADD LESION IMG BX SPEC US  GUIDE 03/15/2022 [facility]   CHOLECYSTECTOMY  08/02/2011   Procedure: LAPAROSCOPIC CHOLECYSTECTOMY WITH INTRAOPERATIVE CHOLANGIOGRAM;  Surgeon: Donnice KATHEE Lunger, MD;  Location: [facility];  Service: General;  Laterality: N/A;   LAPAROSCOPIC GASTRIC BANDING  05/19/2008   LUMBAR DISC SURGERY  03/21/1996   MASTECTOMY W/ SENTINEL NODE BIOPSY Left 04/14/2022   Procedure: LEFT MASTECTOMY WITH SENTINEL NODE BIOPSY;  Surgeon: Vernetta Berg, MD;  Location: [facility];  Service: General;  Laterality: Left;   THYROIDECTOMY N/A 09/08/2015   Procedure: TOTAL THYROIDECTOMY;  Surgeon: Krystal Spinner, MD;  Location: [facility];  Service: General;  Laterality: N/A;   TONSILLECTOMY     Patient Active Problem List   Diagnosis Date Noted   Vitamin B deficiency 11/08/2022   Type 2 diabetes mellitus without complication, without  long-term current use of insulin  (HCC) 11/08/2022   Pre-diabetes 11/08/2022   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   Memory impairment 11/08/2022   Hypomagnesemia 11/08/2022   Hypertension 11/08/2022   Hyperlipidemia 11/08/2022   History of papillary adenocarcinoma of thyroid  11/08/2022   History of ITP 11/08/2022   History of COVID-19 11/08/2022   History of adenomatous polyp of colon 11/08/2022   GERD (gastroesophageal reflux disease) 11/08/2022   Cholelithiasis 11/08/2022   BMI 40.0-44.9, adult (HCC) 11/08/2022   Aortic atherosclerosis 11/08/2022   Genetic testing 03/31/2022   Malignant neoplasm of upper-outer quadrant of left breast in female, estrogen receptor positive (HCC) 03/21/2022   Disorder of tendon 02/17/2017   Toxic multinodular goiter 09/08/2015   Hyperthyroidism 09/04/2015   Multinodular goiter 04/29/2011   Chronic cholecystitis 04/29/2011   Lapband APS April 2010 03/18/2011   GIST (gastrointestinal stromal tumor), 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 It measures 9.2 cm of calcifications and is located in the upper outer quadrant. It is ER/PR positive and HER2 negative with a Ki67 of 30%. Lt mastectomy with SLNB on 04/14/2022.  PRECAUTIONS: left UE Lymphedema risk, None  SUBJECTIVE: Pt returns for her 3 month L-Dex screen.  PAIN:  Are you having pain? No  SOZO SCREENING: Patient was assessed today using the SOZO machine to determine the lymphedema index score. This was compared to her baseline score. It was determined that she is within the recommended range when compared to her baseline and no further action is needed at this time. She will continue SOZO screenings. These are done every 3  months for 2 years post operatively followed by every 6 months for 2 years, and then annually.   L-DEX FLOWSHEETS - 12/25/23 0900       L-DEX LYMPHEDEMA SCREENING   Measurement Type Unilateral    L-DEX MEASUREMENT EXTREMITY Upper Extremity    POSITION  Standing    DOMINANT SIDE Left    At Risk Side Left    BASELINE SCORE (UNILATERAL) -3.2    L-DEX SCORE (UNILATERAL) -7.4    VALUE CHANGE (UNILAT) -4.2          P: Cont with one more 3 month L-Dex screens, then can transition to 6 months until 03/2027.   Aden Berwyn Caldron, PTA 12/25/2023, 9:32 AM

## 2024-01-02 ENCOUNTER — Inpatient Hospital Stay

## 2024-01-02 ENCOUNTER — Other Ambulatory Visit: Payer: Self-pay | Admitting: Hematology and Oncology

## 2024-01-02 ENCOUNTER — Inpatient Hospital Stay: Admitting: Pharmacist

## 2024-01-10 ENCOUNTER — Other Ambulatory Visit: Payer: Self-pay

## 2024-01-10 DIAGNOSIS — C50412 Malignant neoplasm of upper-outer quadrant of left female breast: Secondary | ICD-10-CM

## 2024-01-10 NOTE — Progress Notes (Signed)
 [facility]        Telephone: [phone]?Fax: [phone]   Oncology Clinical Pharmacist Practitioner Progress Note   Lauren [name] was contacted via in-person to discuss her chemotherapy regimen for abemaciclib  which they receive under the care of Dr. Amber Stalls.  Current treatment regimen and start date Abemaciclib  (12/19/22) 50 mg BID (01/16/23) 100 mg BID (12/19/22) - stopped d/t GI side effects  Interval History She continues on abemaciclib  50 mg by mouth every 12 hours on days 1 to 28 of a 28-day cycle. This is being given in combination with letrozole . Therapy is planned to continue until two years in the adjuvant setting per the [facility] trial data. She last saw clinical pharmacy on 09/20/23 and Dr. Stalls on 11/07/23. She will see Dr. Stalls again on 02/28/24.   Response to Therapy Lauren [name] is doing well. Her labs are stable. She is experiencing no new side effects from the abemaciclib . She will see Dr. Stalls again with labs prior on 02/28/24 and she will see clinical pharmacy again in 4 months. She is due to finish adjuvant abemaciclib  on 12/18/24.  Labs, vitals, treatment parameters, and manufacturer guidelines assessing toxicity were reviewed with Lauren [name]. Based on these values, patient is in agreement to continue abemaciclib  therapy at this time.  Allergies No Known Allergies  Vitals    01/15/2024    8:47 AM 12/25/2023    9:31 AM 11/07/2023    9:01 AM  Oncology Vitals  Height 166 cm    Weight 91.717 kg 91.286 kg 92.443 kg  Weight (lbs) 202 lbs 3 oz 201 lbs 4 oz 203 lbs 13 oz  BMI 33.14 kg/m2 32.48 kg/m2 32.89 kg/m2  Temp 97.8 F (36.6 C)  97.9 F (36.6 C)  Pulse Rate 76  77  BP 135/63  137/70  Resp 16  17  SpO2 100 %  100 %  BSA (m2) 2.06 m2 2.06 m2 2.07 m2   Laboratory Data    Latest Ref Rng & Units 01/15/2024    8:34 AM 11/07/2023    8:26 AM 09/20/2023    8:45 AM  CBC EXTENDED  WBC 4.0 - 10.5 K/uL 4.2  4.3  4.5   RBC  3.87 - 5.11 MIL/uL 3.69  3.84  3.92   Hemoglobin 12.0 - 15.0 g/dL 87.7  87.4  87.2   HCT 36.0 - 46.0 % 34.9  36.1  36.8   Platelets 150 - 400 K/uL 159  163  162   NEUT# 1.7 - 7.7 K/uL 2.2  2.3  2.5   Lymph# 0.7 - 4.0 K/uL 1.3  1.3  1.4        Latest Ref Rng & Units 01/15/2024    8:34 AM 11/07/2023    8:26 AM 09/20/2023    8:45 AM  CMP  Glucose 70 - 99 mg/dL 847  856  861   BUN 8 - 23 mg/dL 16  19  23    Creatinine 0.44 - 1.00 mg/dL 8.86  8.86  8.83   Sodium 135 - 145 mmol/L 142  142  141   Potassium 3.5 - 5.1 mmol/L 3.7  3.6  3.9   Chloride 98 - 111 mmol/L 107  107  106   CO2 22 - 32 mmol/L 27  29  29    Calcium  8.9 - 10.3 mg/dL 9.7  9.9  9.6   Total Protein 6.5 - 8.1 g/dL 6.5  6.6  6.5   Total Bilirubin 0.0 -  1.2 mg/dL 0.7  0.6  0.7   Alkaline Phos 38 - 126 U/L 64  64  68   AST 15 - 41 U/L 24  17  18    ALT 0 - 44 U/L 23  18  21      Adverse Effects Assessment Serum creatinine:stable, drinking plenty of fluids and monitoring urine output  Adherence Assessment Lauren [name] reports missing 0 doses over the past 8 weeks.   Reason for missed dose: N/A Patient was re-educated on importance of adherence.   Access Assessment Lauren [name] is currently receiving her abemaciclib  through [employer] concerns:  none  Medication Reconciliation The patient's medication list was reviewed [name] with the patient? Yes New medications or herbal supplements have recently been started? None, updated current med list since some meds not taking currently Any medications have been discontinued? no The medication list was updated and reconciled based on the patient's most recent medication list in the electronic medical record (EMR) including herbal products and OTC medications.   Medications Current Outpatient Medications  Medication Sig Dispense Refill   ACCU-CHEK GUIDE test strip check sugar 3x daily In Vitro three times daily for 90 days     Accu-Chek Softclix  Lancets lancets USE 1 TO CHECK GLUCOSE THREE TIMES DAILY     aspirin 81 MG tablet Take 81 mg by mouth daily.      benazepril -hydrochlorthiazide (LOTENSIN  HCT) 20-25 MG tablet Take 1 tablet by mouth daily.     Blood Glucose Monitoring Suppl (ACCU-CHEK GUIDE) w/Device KIT check glucose daily and as needed for 365 days     Cyanocobalamin  (B-12) 500 MCG SUBL Place 1 tablet under the tongue daily. (Patient taking differently: Place 1 tablet under the tongue every other day.)     docusate sodium (COLACE) 100 MG capsule Take 100 mg by mouth daily.     empagliflozin (JARDIANCE) 10 MG TABS tablet Take 10 mg by mouth daily.     EUTHYROX  175 MCG tablet Take 175 mcg by mouth daily before breakfast.     letrozole  (FEMARA ) 2.5 MG tablet Take 1 tablet (2.5 mg total) by mouth daily. 90 tablet 3   metFORMIN (GLUCOPHAGE) 500 MG tablet 500 mg 2 (two) times daily with a meal.     metoprolol  succinate (TOPROL  XL) 50 MG 24 hr tablet Take 1 tablet (50 mg total) by mouth daily. Take with or immediately following a meal. 90 tablet 2   rosuvastatin  (CRESTOR ) 40 MG tablet Take 1 tablet (40 mg total) by mouth daily. 90 tablet 2   terbinafine (LAMISIL) 250 MG tablet  (Patient taking differently: as needed.)     VERZENIO  50 MG tablet TAKE 1 TABLET BY MOUTH TWICE  DAILY 56 tablet 3   VITAMIN D3 1.25 MG (50000 UT) capsule Take 50,000 Units by mouth once a week. (Patient taking differently: Take 50,000 Units by mouth every 14 (fourteen) days.)     dicyclomine  (BENTYL ) 10 MG capsule Take 1 capsule (10 mg total) by mouth 2 (two) times daily as needed for spasms. (Patient not taking: Reported on 01/15/2024) 30 capsule 0   nystatin cream (MYCOSTATIN)  (Patient not taking: Reported on 01/15/2024)     ondansetron  (ZOFRAN ) 8 MG tablet Take 1 tablet (8 mg total) by mouth every 8 (eight) hours as needed for nausea or vomiting. (Patient not taking: Reported on 01/15/2024) 30 tablet 2   triamcinolone (KENALOG) 0.025 % cream  (Patient not  taking: Reported on 01/15/2024)  No current facility-administered medications for this visit.   Drug-Drug Interactions (DDIs) DDIs were evaluated? Yes Significant DDIs? No , terbinafine is topical and PRN The patient was instructed to speak with their health care provider and/or the oral chemotherapy pharmacist before starting any new drug, including prescription or over the counter, natural / herbal products, or vitamins.  Supportive Care Diarrhea: we reviewed that diarrhea is common with abemaciclib  and confirmed that she does have loperamide (Imodium) at home.  We reviewed how to take this medication PRN. Neutropenia: we discussed the importance of having a thermometer and what the Centers for Disease Control and Prevention (CDC) considers a fever which is 100.95F (38C) or higher.  Gave patient 24/7 triage line to call if any fevers or symptoms. ILD/Pneumonitis: we reviewed potential symptoms including cough, shortness, and fatigue.  VTE: reviewed signs of DVT such as leg swelling, redness, pain, or tenderness and signs of PE such as shortness of breath, rapid or irregular heartbeat, cough, chest pain, or lightheadedness. Reviewed to take the medication every 12 hours (with food sometimes can be easier on the stomach) and to take it at the same time every day. Hepatotoxicity: WNL Drug interactions with grapefruit products  Dosing Assessment Hepatic adjustments needed? No  Renal adjustments needed? No  Toxicity adjustments needed? No  The current dosing regimen is appropriate to continue at this time.  Follow-Up Plan Continue abemaciclib  50 mg by mouth every 12 hours Continue letrozole  2.5 mg by mouth daily Monitor for side effects Labs, Dr. Loretha visit scheduled for 02/28/24. If continues to tolerate, can likely be seen every 3 months Mammogram scheduled for 03/13/24 Will add labs, pharmacy clinic visit in 4 months Lauren [name] can follow up with clinical pharmacy as deemed  necessary by Dr. Amber Iruku going forward   Lauren [name] participated in the discussion, expressed understanding, and voiced agreement with the above plan. All questions were answered to her satisfaction. The patient was advised to contact the clinic at [phone] with any questions or concerns prior to her return visit.   I spent 30 minutes assessing and educating the patient.  Dakotah Heiman A. Lucila, PharmD, BCOP, CPP  Norleen DELENA Lucila, RPH-CPP, 01/15/2024  9:30 AM   **Disclaimer: This note was dictated with voice recognition software. Similar sounding words can inadvertently be transcribed and this note may contain transcription errors which may not have been corrected upon publication of note.**

## 2024-01-15 ENCOUNTER — Inpatient Hospital Stay: Admitting: Pharmacist

## 2024-01-15 ENCOUNTER — Inpatient Hospital Stay: Attending: Hematology and Oncology

## 2024-01-15 VITALS — BP 135/63 | HR 76 | Temp 97.8°F | Resp 16 | Ht 65.5 in | Wt 202.2 lb

## 2024-01-15 DIAGNOSIS — Z1731 Human epidermal growth factor receptor 2 positive status: Secondary | ICD-10-CM | POA: Insufficient documentation

## 2024-01-15 DIAGNOSIS — Z923 Personal history of irradiation: Secondary | ICD-10-CM | POA: Insufficient documentation

## 2024-01-15 DIAGNOSIS — Z170 Estrogen receptor positive status [ER+]: Secondary | ICD-10-CM | POA: Diagnosis not present

## 2024-01-15 DIAGNOSIS — C50412 Malignant neoplasm of upper-outer quadrant of left female breast: Secondary | ICD-10-CM | POA: Diagnosis present

## 2024-01-15 DIAGNOSIS — Z79811 Long term (current) use of aromatase inhibitors: Secondary | ICD-10-CM | POA: Insufficient documentation

## 2024-01-15 DIAGNOSIS — Z1721 Progesterone receptor positive status: Secondary | ICD-10-CM | POA: Diagnosis not present

## 2024-01-15 LAB — CBC WITH DIFFERENTIAL (CANCER CENTER ONLY)
Abs Immature Granulocytes: 0.01 K/uL (ref 0.00–0.07)
Basophils Absolute: 0.1 K/uL (ref 0.0–0.1)
Basophils Relative: 1 %
Eosinophils Absolute: 0.1 K/uL (ref 0.0–0.5)
Eosinophils Relative: 3 %
HCT: 34.9 % — ABNORMAL LOW (ref 36.0–46.0)
Hemoglobin: 12.2 g/dL (ref 12.0–15.0)
Immature Granulocytes: 0 %
Lymphocytes Relative: 32 %
Lymphs Abs: 1.3 K/uL (ref 0.7–4.0)
MCH: 33.1 pg (ref 26.0–34.0)
MCHC: 35 g/dL (ref 30.0–36.0)
MCV: 94.6 fL (ref 80.0–100.0)
Monocytes Absolute: 0.4 K/uL (ref 0.1–1.0)
Monocytes Relative: 9 %
Neutro Abs: 2.2 K/uL (ref 1.7–7.7)
Neutrophils Relative %: 55 %
Platelet Count: 159 K/uL (ref 150–400)
RBC: 3.69 MIL/uL — ABNORMAL LOW (ref 3.87–5.11)
RDW: 12.4 % (ref 11.5–15.5)
WBC Count: 4.2 K/uL (ref 4.0–10.5)
nRBC: 0 % (ref 0.0–0.2)

## 2024-01-15 LAB — CMP (CANCER CENTER ONLY)
ALT: 23 U/L (ref 0–44)
AST: 24 U/L (ref 15–41)
Albumin: 4 g/dL (ref 3.5–5.0)
Alkaline Phosphatase: 64 U/L (ref 38–126)
Anion gap: 9 (ref 5–15)
BUN: 16 mg/dL (ref 8–23)
CO2: 27 mmol/L (ref 22–32)
Calcium: 9.7 mg/dL (ref 8.9–10.3)
Chloride: 107 mmol/L (ref 98–111)
Creatinine: 1.13 mg/dL — ABNORMAL HIGH (ref 0.44–1.00)
GFR, Estimated: 53 mL/min — ABNORMAL LOW
Glucose, Bld: 152 mg/dL — ABNORMAL HIGH (ref 70–99)
Potassium: 3.7 mmol/L (ref 3.5–5.1)
Sodium: 142 mmol/L (ref 135–145)
Total Bilirubin: 0.7 mg/dL (ref 0.0–1.2)
Total Protein: 6.5 g/dL (ref 6.5–8.1)

## 2024-01-22 ENCOUNTER — Telehealth: Payer: Self-pay

## 2024-01-22 NOTE — Telephone Encounter (Signed)
 Oral Oncology Patient Advocate Encounter   Received notification that prior authorization for VERZENIO   is due for renewal.   PA submitted on 01/22/2024 Key [id number] Status is pending      Charlott Hamilton,  CPhT-Adv  she/her/hers [facility]  [facility] Pharmacy Technician Patient Advocate Specialist III [facility] Phone: [phone]  Fax: [phone] [email or web address]

## 2024-01-22 NOTE — Telephone Encounter (Signed)
 Oral Oncology Patient Advocate Encounter  Prior Authorization renewal  for Verzenio  has been approved.    PA# [id number] Effective dates: 01/22/2024 through 02/13/2025     Charlott Hamilton,  CPhT-Adv  she/her/hers [place]  [facility] Pharmacy Technician Patient Advocate Specialist III [facility] Phone: [phone]  Fax: [phone] [email or web address]

## 2024-02-27 ENCOUNTER — Other Ambulatory Visit: Payer: Self-pay

## 2024-02-27 DIAGNOSIS — C50412 Malignant neoplasm of upper-outer quadrant of left female breast: Secondary | ICD-10-CM

## 2024-02-28 ENCOUNTER — Inpatient Hospital Stay

## 2024-02-28 ENCOUNTER — Inpatient Hospital Stay: Attending: Hematology and Oncology | Admitting: Hematology and Oncology

## 2024-02-28 VITALS — BP 141/72 | HR 83 | Temp 97.3°F | Resp 17 | Wt 200.2 lb

## 2024-02-28 DIAGNOSIS — Z170 Estrogen receptor positive status [ER+]: Secondary | ICD-10-CM | POA: Diagnosis not present

## 2024-02-28 DIAGNOSIS — C50412 Malignant neoplasm of upper-outer quadrant of left female breast: Secondary | ICD-10-CM

## 2024-02-28 LAB — CBC WITH DIFFERENTIAL (CANCER CENTER ONLY)
Abs Immature Granulocytes: 0.01 K/uL (ref 0.00–0.07)
Basophils Absolute: 0.1 K/uL (ref 0.0–0.1)
Basophils Relative: 2 %
Eosinophils Absolute: 0.2 K/uL (ref 0.0–0.5)
Eosinophils Relative: 4 %
HCT: 36.3 % (ref 36.0–46.0)
Hemoglobin: 12.8 g/dL (ref 12.0–15.0)
Immature Granulocytes: 0 %
Lymphocytes Relative: 34 %
Lymphs Abs: 1.6 K/uL (ref 0.7–4.0)
MCH: 32.3 pg (ref 26.0–34.0)
MCHC: 35.3 g/dL (ref 30.0–36.0)
MCV: 91.7 fL (ref 80.0–100.0)
Monocytes Absolute: 0.4 K/uL (ref 0.1–1.0)
Monocytes Relative: 8 %
Neutro Abs: 2.4 K/uL (ref 1.7–7.7)
Neutrophils Relative %: 52 %
Platelet Count: 177 K/uL (ref 150–400)
RBC: 3.96 MIL/uL (ref 3.87–5.11)
RDW: 11.9 % (ref 11.5–15.5)
WBC Count: 4.6 K/uL (ref 4.0–10.5)
nRBC: 0 % (ref 0.0–0.2)

## 2024-02-28 LAB — CMP (CANCER CENTER ONLY)
ALT: 21 U/L (ref 0–44)
AST: 24 U/L (ref 15–41)
Albumin: 4.1 g/dL (ref 3.5–5.0)
Alkaline Phosphatase: 85 U/L (ref 38–126)
Anion gap: 15 (ref 5–15)
BUN: 17 mg/dL (ref 8–23)
CO2: 23 mmol/L (ref 22–32)
Calcium: 9.7 mg/dL (ref 8.9–10.3)
Chloride: 104 mmol/L (ref 98–111)
Creatinine: 1.14 mg/dL — ABNORMAL HIGH (ref 0.44–1.00)
GFR, Estimated: 52 mL/min — ABNORMAL LOW
Glucose, Bld: 143 mg/dL — ABNORMAL HIGH (ref 70–99)
Potassium: 3.8 mmol/L (ref 3.5–5.1)
Sodium: 141 mmol/L (ref 135–145)
Total Bilirubin: 0.5 mg/dL (ref 0.0–1.2)
Total Protein: 6.7 g/dL (ref 6.5–8.1)

## 2024-02-28 NOTE — Progress Notes (Signed)
 [facility] CONSULT NOTE  Patient Care Team: Dyane Anthony RAMAN, FNP as PCP - General (Family Medicine) Loni Soyla LABOR, MD as PCP - Cardiology (Cardiology) Vernetta Berg, MD as Consulting Physician (General Surgery) Loretha Ash, MD as Consulting Physician (Hematology and Oncology) Dewey Rush, MD as Consulting Physician (Radiation Oncology)  CHIEF COMPLAINTS/PURPOSE OF CONSULTATION:  Newly diagnosed breast cancer  HISTORY OF PRESENTING ILLNESS:  Lauren [name] 69 y.o. female is here because of recent diagnosis of left breast cancer.  I reviewed her records extensively and collaborated the history with the patient.  SUMMARY OF ONCOLOGIC HISTORY: Oncology History  Malignant neoplasm of upper-outer quadrant of left breast in female, estrogen receptor positive (HCC)  03/04/2022 Mammogram   Screening mammogram showed calcs and possible distortion in the left breast. Diag mammogram showed suspicious calcs and non mass finding in the 2 0 clock location of the left breast warranting further tissue diagnosis. No left axillary adenopathy   03/10/2022 Breast US    Suspicious calcifications and non mass enhancement measuring approximately 4.9 cm in largest dimension.   03/23/2022 Cancer Staging   Staging form: Breast, AJCC 8th Edition - Pathologic: Stage IA (pT1b, pN1a, cM0, G2, ER+, PR+, HER2-) - Signed by Loretha Ash, MD on 05/06/2022 Histologic grading system: 3 grade system   03/25/2022 Pathology Results   Pathology showed grade 2 IDC, ER positive, PR positive, her 2 neg, Ki 67 30%   03/31/2022 Genetic Testing   Negative. The Multi-Cancer + RNA Panel offered by [facility] includes sequencing and/or deletion/duplication analysis of the following 70 genes:  AIP*, ALK, APC*, ATM*, AXIN2*, BAP1*, BARD1*, BLM*, BMPR1A*, BRCA1*, BRCA2*, BRIP1*, CDC73*, CDH1*, CDK4, CDKN1B*, CDKN2A, CHEK2*, CTNNA1*, DICER1*, EPCAM (del/dup only), EGFR, FH*, FLCN*, GREM1 (promoter dup only),  HOXB13, KIT, LZTR1, MAX*, MBD4, MEN1*, MET, MITF, MLH1*, MSH2*, MSH3*, MSH6*, MUTYH*, NF1*, NF2*, NTHL1*, PALB2*, PDGFRA, PMS2*, POLD1*, POLE*, POT1*, PRKAR1A*, PTCH1*, PTEN*, RAD51C*, RAD51D*, RB1*, RET, SDHA* (sequencing only), SDHAF2*, SDHB*, SDHC*, SDHD*, SMAD4*, SMARCA4*, SMARCB1*, SMARCE1*, STK11*, SUFU*, TMEM127*, TP53*, TSC1*, TSC2*, VHL*. RNA analysis is performed for * genes.   04/14/2022 Surgery   Left breast mastectomy: IDC, grade 2, 0.8cm, 3/17 LN positive for macrometastases, margins negative.    05/09/2022 Oncotype testing   10/12%   06/06/2022 - 07/21/2022 Radiation Therapy   Plan Name: CW_L_BO_BH Site: Chest Wall, Left Technique: 3D Mode: Photon Dose Per Fraction: 1.8 Gy Prescribed Dose (Delivered / Prescribed): 25.2 Gy / 25.2 Gy Prescribed Fxs (Delivered / Prescribed): 14 / 14   Plan Name: CW_PAB_SCV_BH Site: Chest Wall, Left Technique: 3D Mode: Photon Dose Per Fraction: 1.8 Gy Prescribed Dose (Delivered / Prescribed): 50.4 Gy / 50.4 Gy Prescribed Fxs (Delivered / Prescribed): 28 / 28   Plan Name: CW_L_Bst_BO Site: Chest Wall, Left Technique: Electron Mode: Electron Dose Per Fraction: 2 Gy Prescribed Dose (Delivered / Prescribed): 10 Gy / 10 Gy Prescribed Fxs (Delivered / Prescribed): 5 / 5   Plan Name: CW_L_BH Site: Chest Wall, Left Technique: 3D Mode: Photon Dose Per Fraction: 1.8 Gy Prescribed Dose (Delivered / Prescribed): 25.2 Gy / 25.2 Gy Prescribed Fxs (Delivered / Prescribed): 14 / 14   10/04/2022 -  Anti-estrogen oral therapy   Letrozole     She is now status post left mastectomy, extensive DCIS intermediate nuclear grade, invasive moderately differentiated adenocarcinoma grade 2, invasive tumor measures 8 mm in greatest dimension.  Margins free.  Left axillary lymph nodes regional resection which showed 3 of 17 lymph nodes with metastatic carcinoma showing marked fatty replacement.  Negative for extracapsular extension.  Prior prognostic showed ER  +95% strong staining PR 90% moderate to strong staining HER2 negative 1+ Ki-67 of 30%. She completed adjuvant radiation on 07/21/2022. She is now on adjuvant verzenio  and letrozole .  Discussed the use of AI scribe software for clinical note transcription with the patient, who gave verbal consent to proceed.  History of Present Illness    History of Present Illness Lauren [name] is a 69 year old female with breast cancer who presents for a follow-up visit.  The patient, with hormone receptor-positive breast cancer on adjuvant letrozole  and CDK4/6 inhibitor therapy, presents with worsening right hip pain and arthralgia.  She reports several months of progressively worsening right hip pain, described as deep intra-articular pain, exacerbated by standing and walking, and minimally present while sitting. The right hip is more severely affected than the left, though she experiences intermittent contralateral hip pain. The pain now interferes with sleep and daily activities.  She also endorses bilateral shoulder pain and intermittent knee pain. She has a history of meniscus surgery and osteoarthritis in the knee, which continues to cause episodic discomfort. She recalls prior right hip bursitis treated with steroid injections approximately ten years ago, but is uncertain if her current symptoms represent recurrence of bursitis, progression of arthritis, or are related to her cancer therapies.  She expresses concern that her current regimen, particularly letrozole , may be contributing to her joint pain, though she is unsure if symptom onset coincided with medication initiation. She is aware of the potential for anti-estrogen therapies to cause musculoskeletal symptoms. She reports she was supposed to be on letrozole  for ten years and that Verzenio  was started in November or December, with a dose reduction from 100 mg to 50 mg several weeks after initiation.  She has experienced some unintentional weight  loss, which she attributes to dietary changes. She otherwise feels well aside from her musculoskeletal complaints.   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   not current problem, no hematologist    SURGICAL HISTORY: Past Surgical History:  Procedure Laterality Date   ABDOMINAL HYSTERECTOMY  11-18-1998   partial   BREAST BIOPSY Left 03/15/2022   US  LT BREAST BX W LOC DEV 1ST LESION IMG BX SPEC US  GUIDE 03/15/2022 [facility]   BREAST BIOPSY Left 03/15/2022   US  LT BREAST BX W LOC DEV EA ADD LESION IMG BX SPEC US  GUIDE 03/15/2022 [facility]   CHOLECYSTECTOMY  08/02/2011   Procedure: LAPAROSCOPIC CHOLECYSTECTOMY WITH INTRAOPERATIVE CHOLANGIOGRAM;  Surgeon: Donnice KATHEE Lunger, MD;  Location: [facility];  Service: General;  Laterality: N/A;   LAPAROSCOPIC GASTRIC BANDING  05/19/2008   LUMBAR DISC SURGERY  03/21/1996   MASTECTOMY W/ SENTINEL NODE BIOPSY Left 04/14/2022   Procedure: LEFT MASTECTOMY WITH SENTINEL NODE BIOPSY;  Surgeon: Vernetta Berg, MD;  Location: [facility];  Service: General;  Laterality: Left;   THYROIDECTOMY N/A 09/08/2015   Procedure: TOTAL THYROIDECTOMY;  Surgeon: Krystal Spinner, MD;  Location: [facility];  Service: General;  Laterality: N/A;   TONSILLECTOMY      SOCIAL HISTORY: Social History   Socioeconomic History   Marital status: Married    Spouse name: Not on file   Number of children: Not on file  Years of education: Not on file   Highest education level: Not on file  Occupational History   Not on file  Tobacco Use   Smoking status: Never   Smokeless tobacco: Never  Substance and Sexual Activity   Alcohol use: No   Drug use: No   Sexual activity: Yes  Other Topics Concern   Not on file   Social History Narrative   Not on file   Social Drivers of Health   Tobacco Use: Low Risk (07/18/2023)   Patient History    Smoking Tobacco Use: Never    Smokeless Tobacco Use: Never    Passive Exposure: Not on file  Financial Resource Strain: Low Risk (03/23/2022)   Overall Financial Resource Strain (CARDIA)    Difficulty of Paying Living Expenses: Not hard at all  Food Insecurity: No Food Insecurity (03/23/2022)   Hunger Vital Sign    Worried About Running Out of Food in the Last Year: Never true    Ran Out of Food in the Last Year: Never true  Transportation Needs: No Transportation Needs (03/23/2022)   PRAPARE - Administrator, Civil Service (Medical): No    Lack of Transportation (Non-Medical): No  Physical Activity: Not on file  Stress: Not on file  Social Connections: Not on file  Intimate Partner Violence: Not on file  Depression (EYV7-0): Not on file  Alcohol Screen: Not on file  Housing: Low Risk (03/23/2022)   Housing    Last Housing Risk Score: 0  Utilities: Not At Risk (03/23/2022)   AHC Utilities    Threatened with loss of utilities: No  Health Literacy: Not on file    FAMILY HISTORY: Family History  Problem Relation Age of Onset   Colon polyps Mother    Heart disease Mother    Diabetes Father    Heart disease Father    Breast cancer Maternal Aunt 48 - 69   Ovarian cancer Paternal Aunt    Bladder Cancer Paternal Uncle        paternal half-uncle   Breast cancer Cousin 33 - 60    ALLERGIES:  has no known allergies.  MEDICATIONS:  Current Outpatient Medications  Medication Sig Dispense Refill   ACCU-CHEK GUIDE test strip check sugar 3x daily In Vitro three times daily for 90 days     Accu-Chek Softclix Lancets lancets USE 1 TO CHECK GLUCOSE THREE TIMES DAILY     aspirin 81 MG tablet Take 81 mg by mouth daily.      benazepril -hydrochlorthiazide (LOTENSIN  HCT) 20-25 MG tablet Take 1 tablet by mouth daily.     Blood Glucose Monitoring Suppl  (ACCU-CHEK GUIDE) w/Device KIT check glucose daily and as needed for 365 days     Cyanocobalamin  (B-12) 500 MCG SUBL Place 1 tablet under the tongue daily. (Patient taking differently: Place 1 tablet under the tongue every other day.)     dicyclomine  (BENTYL ) 10 MG capsule Take 1 capsule (10 mg total) by mouth 2 (two) times daily as needed for spasms. (Patient not taking: Reported on 01/15/2024) 30 capsule 0   docusate sodium (COLACE) 100 MG capsule Take 100 mg by mouth daily.     empagliflozin (JARDIANCE) 10 MG TABS tablet Take 10 mg by mouth daily.     EUTHYROX  175 MCG tablet Take 175 mcg by mouth daily before breakfast.     letrozole  (FEMARA ) 2.5 MG tablet Take 1 tablet (2.5 mg total) by mouth daily. 90 tablet 3   metFORMIN (GLUCOPHAGE) 500  MG tablet 500 mg 2 (two) times daily with a meal.     metoprolol  succinate (TOPROL  XL) 50 MG 24 hr tablet Take 1 tablet (50 mg total) by mouth daily. Take with or immediately following a meal. 90 tablet 2   nystatin cream (MYCOSTATIN)  (Patient not taking: Reported on 01/15/2024)     ondansetron  (ZOFRAN ) 8 MG tablet Take 1 tablet (8 mg total) by mouth every 8 (eight) hours as needed for nausea or vomiting. (Patient not taking: Reported on 01/15/2024) 30 tablet 2   rosuvastatin  (CRESTOR ) 40 MG tablet Take 1 tablet (40 mg total) by mouth daily. 90 tablet 2   terbinafine (LAMISIL) 250 MG tablet  (Patient taking differently: as needed.)     triamcinolone (KENALOG) 0.025 % cream  (Patient not taking: Reported on 01/15/2024)     VERZENIO  50 MG tablet TAKE 1 TABLET BY MOUTH TWICE  DAILY 56 tablet 3   VITAMIN D3 1.25 MG (50000 UT) capsule Take 50,000 Units by mouth once a week. (Patient taking differently: Take 50,000 Units by mouth every 14 (fourteen) days.)     No current facility-administered medications for this visit.   REVIEW OF SYSTEMS:   Constitutional: Denies fevers, chills or abnormal night sweats Eyes: Denies blurriness of vision, double vision or watery  eyes Ears, nose, mouth, throat, and face: Denies mucositis or sore throat Respiratory: Denies cough, dyspnea or wheezes Cardiovascular: Denies palpitation, chest discomfort or lower extremity swelling Gastrointestinal:  Denies nausea, heartburn or change in bowel habits Skin: Denies abnormal skin rashes Lymphatics: Denies new lymphadenopathy or easy bruising Neurological:Denies numbness, tingling or new weaknesses Behavioral/Psych: Mood is stable, no new changes  Breast: Denies any palpable lumps or discharge All other systems were reviewed with the patient and are negative.  PHYSICAL EXAMINATION: ECOG PERFORMANCE STATUS: 0 - Asymptomatic  Vitals:   02/28/24 0831  BP: (!) 141/72  Pulse: 83  Resp: 17  Temp: (!) 97.3 F (36.3 C)  SpO2: 98%     Filed Weights   02/28/24 0831  Weight: 200 lb 3.2 oz (90.8 kg)     Physical Exam Constitutional:      Appearance: Normal appearance.  Cardiovascular:     Rate and Rhythm: Normal rate and regular rhythm.     Pulses: Normal pulses.     Heart sounds: Normal heart sounds.  Pulmonary:     Effort: Pulmonary effort is normal.     Breath sounds: Normal breath sounds.  Abdominal:     General: Abdomen is flat. Bowel sounds are normal. There is no distension.     Palpations: There is no mass.     Comments: Abdominal midline hernia  Musculoskeletal:     Cervical back: Normal range of motion. No rigidity.  Lymphadenopathy:     Cervical: No cervical adenopathy.  Skin:    General: Skin is warm and dry.  Neurological:     General: No focal deficit present.     Mental Status: She is alert.  Psychiatric:        Mood and Affect: Mood normal.      LABORATORY DATA:  I have reviewed the data as listed Lab Results  Component Value Date   WBC 4.6 02/28/2024   HGB 12.8 02/28/2024   HCT 36.3 02/28/2024   MCV 91.7 02/28/2024   PLT 177 02/28/2024   Lab Results  Component Value Date   NA 142 01/15/2024   K 3.7 01/15/2024   CL 107  01/15/2024   CO2 27  01/15/2024    RADIOGRAPHIC STUDIES: I have personally reviewed the radiological reports and agreed with the findings in the report.  ASSESSMENT AND PLAN:   Assessment and Plan Assessment & Plan Arthralgia possibly related to aromatase inhibitor therapy Letrozole  may exacerbate joint pain or arthritis. Differential includes osteoarthritis, bursitis, or medication-induced arthralgia. No lab evidence of bony metastatic disease. - Recommend orthopedic evaluation, including possible radiographs for osteoarthritis or bursitis. - Advised her to contact orthopedics for evaluation and management. - Consider holding letrozole  for 2-4 weeks if no significant anatomic etiology is found. - Discussed potential switch to tamoxifen if letrozole  is confirmed as the cause.  Breast cancer on adjuvant endocrine and CDK4/6 inhibitor therapy Continues letrozole  and abemaciclib  for hormone receptor-positive breast cancer. Therapy generally tolerated except for possible letrozole -induced arthralgia. Blood counts and labs stable, no disease recurrence or progression. - Monitor for adverse effects, particularly arthralgia, and adjust therapy based on orthopedic evaluation and symptoms. - Routine follow-up every eight weeks, interim follow-up in April. - Coordinate with orthopedics regarding potential medication-related side effects if contacted.   Total time spent: 30 minutes including history, physical exam, review of records, counseling and coordination of care, All questions were answered. The patient knows to call the clinic with any problems, questions or concerns.    Amber Stalls, MD 02/28/2024

## 2024-03-05 ENCOUNTER — Other Ambulatory Visit: Payer: Self-pay

## 2024-03-05 NOTE — Progress Notes (Signed)
 Pt is taking.  $2100 deductible

## 2024-03-13 ENCOUNTER — Ambulatory Visit

## 2024-03-21 ENCOUNTER — Ambulatory Visit

## 2024-03-25 ENCOUNTER — Ambulatory Visit

## 2024-04-05 ENCOUNTER — Ambulatory Visit

## 2024-05-27 ENCOUNTER — Inpatient Hospital Stay

## 2024-05-27 ENCOUNTER — Inpatient Hospital Stay: Admitting: Pharmacist
